# Patient Record
Sex: Female | Born: 1950
Health system: Southern US, Community
[De-identification: ages and names within clinical notes are randomized; demographics above are authoritative.]

## PROBLEM LIST (undated history)

## (undated) DIAGNOSIS — F419 Anxiety disorder, unspecified: Secondary | ICD-10-CM

## (undated) DIAGNOSIS — K219 Gastro-esophageal reflux disease without esophagitis: Secondary | ICD-10-CM

## (undated) DIAGNOSIS — M1990 Unspecified osteoarthritis, unspecified site: Secondary | ICD-10-CM

## (undated) DIAGNOSIS — E785 Hyperlipidemia, unspecified: Secondary | ICD-10-CM

## (undated) DIAGNOSIS — I10 Essential (primary) hypertension: Secondary | ICD-10-CM

## (undated) DIAGNOSIS — F32A Depression, unspecified: Secondary | ICD-10-CM

## (undated) DIAGNOSIS — I341 Nonrheumatic mitral (valve) prolapse: Secondary | ICD-10-CM

## (undated) DIAGNOSIS — Z9889 Other specified postprocedural states: Secondary | ICD-10-CM

## (undated) DIAGNOSIS — J189 Pneumonia, unspecified organism: Secondary | ICD-10-CM

## (undated) DIAGNOSIS — M549 Dorsalgia, unspecified: Secondary | ICD-10-CM

## (undated) DIAGNOSIS — E559 Vitamin D deficiency, unspecified: Secondary | ICD-10-CM

## (undated) DIAGNOSIS — R7303 Prediabetes: Secondary | ICD-10-CM

## (undated) DIAGNOSIS — B009 Herpesviral infection, unspecified: Secondary | ICD-10-CM

## (undated) DIAGNOSIS — E7800 Pure hypercholesterolemia, unspecified: Secondary | ICD-10-CM

## (undated) DIAGNOSIS — K759 Inflammatory liver disease, unspecified: Secondary | ICD-10-CM

## (undated) DIAGNOSIS — K5790 Diverticulosis of intestine, part unspecified, without perforation or abscess without bleeding: Secondary | ICD-10-CM

## (undated) DIAGNOSIS — K922 Gastrointestinal hemorrhage, unspecified: Secondary | ICD-10-CM

## (undated) DIAGNOSIS — F329 Major depressive disorder, single episode, unspecified: Secondary | ICD-10-CM

## (undated) DIAGNOSIS — M170 Bilateral primary osteoarthritis of knee: Secondary | ICD-10-CM

## (undated) DIAGNOSIS — J45909 Unspecified asthma, uncomplicated: Secondary | ICD-10-CM

## (undated) DIAGNOSIS — D649 Anemia, unspecified: Secondary | ICD-10-CM

## (undated) DIAGNOSIS — Z9289 Personal history of other medical treatment: Secondary | ICD-10-CM

## (undated) HISTORY — PX: ARTHROSCOPY KNEE, DIAGNOSTIC: SHX510784

## (undated) HISTORY — DX: Nonrheumatic mitral (valve) prolapse: I34.1

## (undated) HISTORY — DX: Hyperlipidemia, unspecified: E78.5

## (undated) HISTORY — DX: Unspecified osteoarthritis, unspecified site: M19.90

## (undated) HISTORY — DX: Essential (primary) hypertension: I10

## (undated) HISTORY — DX: Depression, unspecified: F32.A

## (undated) HISTORY — DX: Anxiety disorder, unspecified: F41.9

## (undated) HISTORY — PX: COLONOSCOPY: SHX174

## (undated) HISTORY — PX: BREAST EXCISIONAL BIOPSY: SUR124

## (undated) HISTORY — DX: Gastro-esophageal reflux disease without esophagitis: K21.9

## (undated) HISTORY — DX: Bilateral primary osteoarthritis of knee: M17.0

## (undated) HISTORY — DX: Herpesviral infection, unspecified: B00.9

## (undated) HISTORY — DX: Unspecified asthma, uncomplicated: J45.909

## (undated) HISTORY — DX: Other specified postprocedural states: Z98.890

## (undated) HISTORY — DX: Inflammatory liver disease, unspecified: K75.9

## (undated) HISTORY — DX: Pure hypercholesterolemia, unspecified: E78.00

## (undated) HISTORY — PX: KNEE ARTHROSCOPY: SUR90

## (undated) HISTORY — DX: Prediabetes: R73.03

## (undated) HISTORY — DX: Vitamin D deficiency, unspecified: E55.9

## (undated) HISTORY — DX: Pneumonia, unspecified organism: J18.9

## (undated) HISTORY — DX: Gastrointestinal hemorrhage, unspecified: K92.2

## (undated) HISTORY — DX: Diverticulosis of intestine, part unspecified, without perforation or abscess without bleeding: K57.90

## (undated) HISTORY — DX: Personal history of other medical treatment: Z92.89

## (undated) HISTORY — DX: Major depressive disorder, single episode, unspecified: F32.9

## (undated) HISTORY — DX: Anemia, unspecified: D64.9

## (undated) HISTORY — DX: Dorsalgia, unspecified: M54.9

---

## 1996-09-06 DIAGNOSIS — I341 Nonrheumatic mitral (valve) prolapse: Secondary | ICD-10-CM

## 1996-09-06 HISTORY — DX: Nonrheumatic mitral (valve) prolapse: I34.1

## 2004-05-14 ENCOUNTER — Ambulatory Visit: Admit: 2004-05-14 | Disposition: A | Discharge status: Home / Self Care | Payer: Self-pay | Source: Ambulatory Visit

## 2007-05-11 ENCOUNTER — Ambulatory Visit
Admit: 2007-05-11 | Disposition: A | Discharge status: Home / Self Care | Payer: Self-pay | Source: Ambulatory Visit | Admitting: Surgery

## 2007-05-11 LAB — BASIC METABOLIC PANEL
BUN: 23 mg/dL — ABNORMAL HIGH (ref 8–20)
CO2: 29 mEq/L (ref 21–30)
Calcium: 9.3 mg/dL (ref 8.6–10.2)
Chloride: 102 mEq/L (ref 98–107)
Creatinine: 1.1 mg/dL (ref 0.6–1.5)
Glucose: 82 mg/dL (ref 70–100)
Potassium: 4 mEq/L (ref 3.6–5.0)
Sodium: 143 mEq/L (ref 136–146)

## 2007-05-11 LAB — CBC AND DIFFERENTIAL
Basophils Absolute: 0.1 /mm3 (ref 0.0–0.2)
Basophils: 1 % (ref 0–2)
Eosinophils Absolute: 0.2 /mm3 (ref 0.0–0.7)
Eosinophils: 2 % (ref 0–5)
Granulocytes Absolute: 5.1 /mm3 (ref 1.8–8.1)
Hematocrit: 38.3 % (ref 37.0–47.0)
Hgb: 12.3 G/DL (ref 12.0–16.0)
Immature Granulocytes Absolute: 0
Immature Granulocytes: 0 %
Lymphocytes Absolute: 4.5 /mm3 — ABNORMAL HIGH (ref 0.5–4.4)
Lymphocytes: 43 % — ABNORMAL HIGH (ref 15–41)
MCH: 30.1 PG (ref 28.0–32.0)
MCHC: 32.1 G/DL (ref 32.0–36.0)
MCV: 93.9 FL (ref 80.0–100.0)
MPV: 10.6 FL — ABNORMAL HIGH (ref 7.4–10.4)
Monocytes Absolute: 0.7 /mm3 (ref 0.0–1.2)
Monocytes: 6 % (ref 0–11)
Neutrophils %: 49 % — ABNORMAL LOW (ref 52–75)
Platelets: 309 /mm3 (ref 140–400)
RBC: 4.08 /mm3 — ABNORMAL LOW (ref 4.20–5.40)
RDW: 13.6 % (ref 11.5–15.0)
WBC: 10.43 /mm3 (ref 3.50–10.80)

## 2007-05-11 LAB — GFR

## 2007-05-20 ENCOUNTER — Ambulatory Visit: Admission: RE | Admit: 2007-05-20 | Payer: Self-pay | Source: Ambulatory Visit | Admitting: Surgery

## 2007-05-20 HISTORY — PX: BREAST SURGERY: SHX581

## 2008-05-06 HISTORY — PX: BREAST BIOPSY: SHX20

## 2011-02-01 LAB — ECG 12-LEAD
Atrial Rate: 69 {beats}/min
P Axis: 38 degrees
P-R Interval: 160 ms
Q-T Interval: 388 ms
QRS Duration: 86 ms
QTC Calculation (Bezet): 415 ms
R Axis: 45 degrees
T Axis: 42 degrees
Ventricular Rate: 69 {beats}/min

## 2011-02-22 NOTE — Op Note (Signed)
DATE OF BIRTH:                        02-24-1951      ADMISSION DATE:                     05/20/2007            PATIENT LOCATION:                     [id number]            DATE OF PROCEDURE:                   05/20/2007      SURGEON:                            Arnette Norris, MD      ASSISTANT(S):                         Kristen Rupell, PA-C            PREOPERATIVE DIAGNOSIS:      1. Status post stereotactic left breast biopsy.      2. 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            POSTOPERATIVE DIAGNOSIS:      1. Status post stereotactic left breast biopsy.      2. Biopsy diagnosis of atypical ductal hyperplasia-left breast mass.            TITLE OF PROCEDURE:    Needle placement, excisional biopsy left breast.            COMPLICATIONS:    No complications.            ESTIMATED BLOOD LOSS:    Minimal.            ANESTHESIA:  Local sedation.            INDICATION:  The patient is a 60 year old woman kindly referred by her      primary physician, Dr. Clement Sayres for surgical evaluation following a      biopsy of a small mass in the left breast, in the lower outer quadrant in      which a diagnosis of atypical ductal hyperplasia was rendered.  The patient      was seen in consultation, the findings reviewed, the rationale for surgery      discussed.  The patient understood the potential risks, complications and      benefits of operation and agreed to operative intervention on this date.            On the day of surgery, after the patient was taken to x-ray and needle      placement was performed she was seen in the preop holding area.  Final      questions were answered.  The boarding pass and informed consent were      complete.  She was covered prophylactically with 1 gram of Ancef, pneumatic      stockings applied and she was taken to the operating room.            DESCRIPTION OF PROCEDURE:    The patient was placed on the operative table      in the supine position after she was  sedated the left breast was  prepped      and draped in the usual sterile fashion.  Using a needle with a guide and      maintaining dissection within the natural lines of the breast, the skin and      subcutaneous tissue was injected with a combination of local anesthetic.      Once this had been given chance to take effect a small curvilinear incision      was made, carried down to the underlying subcutaneous tissue, exposing the      underlying breast.  The needle was followed to the appropriate length along      its shaft when the parenchyma around the needle was grasped with an Allis      clamp and then excisional biopsy performed.  The specimen was then sent for      radiographic study.  Specimen mammogram confirmed the presence of the      previous area of biopsy including the biopsy clip.  Satisfied, the wound      was checked, fine bleeding controlled with cautery, and when satisfied with      hemostasis additional local was injected in the surrounding parenchyma for      postoperative analgesia prior to skin closure which was done in the usual      way.  The patient tolerated the procedure well and had no intraoperative      complications and was transferred to the recovery area in stable condition      with all sponge, needle and instrument counts reported as correct by the      nursing staff at the conclusion of the case.                  Electronic Signing MD: Arnette Norris, MD  ([id number])            D: 05/20/2007 by Arnette Norris, MD      T: 05/20/2007 by Dot Been ([id number]) ([id number])      cc:  Arnette Norris, MD          Lenard Simmer, MD

## 2012-09-04 ENCOUNTER — Ambulatory Visit (INDEPENDENT_AMBULATORY_CARE_PROVIDER_SITE_OTHER): Payer: No Typology Code available for payment source | Admitting: Family Medicine

## 2012-09-04 ENCOUNTER — Encounter (INDEPENDENT_AMBULATORY_CARE_PROVIDER_SITE_OTHER): Payer: Self-pay

## 2012-09-04 VITALS — BP 120/78 | HR 74 | Temp 98.7°F | Resp 14
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
## 2012-09-04 NOTE — Patient Instructions (Addendum)
Eye Contusion  You have a CONTUSION of your eye. This can cause swelling and bruising of the lids ("black eye") and may also cause bleeding in the white part of the eye. The bruising and lid swelling may increase over the first 12 hours. The lid swelling should start to go down after 1-2 days. The lid bruising may take 1-2 weeks to disappear.  Home Care:  1. Make an ice pack (ice cubes in a plastic bag, wrapped in a towel) and apply for 20 minutes every 1-2 hours the first day. Continue this 3-4 times a day until the swelling starts to go down.  2. You may use acetaminophen (Tylenol) or ibuprofen (Motrin, Advil) to control pain, unless another pain medicine was prescribed. [NOTE:If you have chronic liver or kidney disease or ever had a stomach ulcer or GI bleeding, talk with your doctor before using these medicines.]  Follow Up  with your doctor or this facility if you are not improving within the next THREE days.  [NOTE: If X-rays were taken, they will be reviewed by a radiologist. You will be notified of any new findings that may affect your care.]  Get Prompt Medical Attention  if any of the following occur:   Increasing eye pain   Unable to open eyelid after 2 days, due to swelling   Any sudden changes in your vision   Light flashes   Floaters (small dots or strings that seem to be moving across your field of vision)   Eye pain, redness, or discharge from your eyelid   Blurriness that lasts more than 24 hours      IF WORSE ,   GO TO E.R.       Dark spots in your field of vision   Halos around lights   Dimness of vision   Partial or complete loss of vision   [address], [address], [place], [place] [address]. All rights reserved. This information is not intended as a substitute for professional medical care. Always follow your healthcare professional's instructions.

## 2012-10-03 ENCOUNTER — Other Ambulatory Visit: Payer: Self-pay

## 2012-10-03 MED ORDER — GUAIFENESIN-CODEINE 100-10 MG/5ML PO SYRP
ORAL_SOLUTION | ORAL | 0 refills | Status: DC
Start: 2012-10-03 — End: 2012-10-18
  Filled 2012-10-03: qty 120, 3d supply, fill #0

## 2012-10-03 MED ORDER — ALBUTEROL SULFATE HFA 108 (90 BASE) MCG/ACT IN AERS
INHALATION_SPRAY | RESPIRATORY_TRACT | 0 refills | Status: DC
Start: 2012-10-03 — End: 2012-10-18
  Filled 2012-10-03: qty 8.5, 17d supply, fill #0

## 2012-10-12 ENCOUNTER — Other Ambulatory Visit: Payer: Self-pay

## 2012-10-12 MED ORDER — ALBUTEROL SULFATE HFA 108 (90 BASE) MCG/ACT IN AERS
2.0000 | INHALATION_SPRAY | RESPIRATORY_TRACT | 1 refills | Status: DC | PRN
Start: 2012-10-12 — End: 2015-02-06
  Filled 2012-10-12 (×2): qty 8.5, 17d supply, fill #0

## 2012-10-18 ENCOUNTER — Emergency Department
Admission: EM | Admit: 2012-10-18 | Discharge: 2012-10-18 | Disposition: A | Discharge status: Home / Self Care | Payer: Commercial Managed Care - PPO | Attending: Emergency Medicine | Admitting: Emergency Medicine

## 2012-10-18 ENCOUNTER — Emergency Department: Payer: Commercial Managed Care - PPO

## 2012-10-18 DIAGNOSIS — E785 Hyperlipidemia, unspecified: Secondary | ICD-10-CM | POA: Insufficient documentation

## 2012-10-18 DIAGNOSIS — F3289 Other specified depressive episodes: Secondary | ICD-10-CM | POA: Insufficient documentation

## 2012-10-18 DIAGNOSIS — R059 Cough, unspecified: Secondary | ICD-10-CM | POA: Insufficient documentation

## 2012-10-18 DIAGNOSIS — I10 Essential (primary) hypertension: Secondary | ICD-10-CM | POA: Insufficient documentation

## 2012-10-18 MED ORDER — METHYLPREDNISOLONE 4 MG PO KIT
PACK | ORAL | Status: DC
Start: 2012-10-18 — End: 2014-10-18

## 2012-10-18 NOTE — ED Notes (Signed)
Cough x 2 weeks, prod. X 2 weeks

## 2012-10-18 NOTE — Discharge Instructions (Signed)
Please take steroid medication as prescribed.  Use an H2 blocker (pepcid) for 1-2 weeks and monitor your symptoms to see if they improve.   If Pepcid fails, you may try a proton-pump inhibitor, like Prilosec, instead.

## 2012-10-18 NOTE — ED Provider Notes (Signed)
Physician/Midlevel provider first contact with patient: 10/18/12 1713         EMERGENCY DEPARTMENT HISTORY AND PHYSICAL EXAM    Date: 10/18/2012  Patient Name: Katherine Freeman  Attending Physician: Katherine Cristal, MD        History of Presenting Illness     Chief Complaint:   Chief Complaint   Patient presents with   . Cough     productive white/yllw phlegm x 2 weeks       Katherine Freeman is a 62 y.o. female who  has a past medical history of Depression; Hypertensive disorder; and Hyperlipidemia. c/o persistent productive cough (white sputum) x 2 weeks. Associated with intermittent low grade fever, and mild shortness of breath and chest congestion and tightness. Cough is worse at night. Pt seen by PCP 1.5 weeks ago - prescribed Zpak, cough syrup with codeine, and inhaler with minimal relief.    History obtained from: the patient.    PCP: Katherine Simmer, MD     Past Medical History     Past Medical History   Diagnosis Date   . Depression    . Hypertensive disorder    . Hyperlipidemia        Past Surgical History     History reviewed. No pertinent past surgical history.    Family History     Family History   Problem Relation Age of Onset   . Heart disease Mother    . Stroke Mother    . Cancer Sister    . Cancer Brother        Social History     History     Social History   . Marital Status: Divorced     Spouse Name: N/A     Number of Children: N/A   . Years of Education: N/A     Occupational History   . Not on file.     Social History Main Topics   . Smoking status: Former Games developer   . Smokeless tobacco: Not on file   . Alcohol Use: No   . Drug Use: No   . Sexually Active:      Other Topics Concern   . Not on file     Social History Narrative   . No narrative on file        Allergies     No Known Allergies    Medications     No current facility-administered medications for this encounter.  Current outpatient prescriptions:Mometasone Furo-Formoterol Fum (DULERA) 100-5 MCG/ACT Aerosol, Inhale into the lungs., Disp: ,  Rfl: ;  albuterol (PROAIR HFA) 108 (90 BASE) MCG/ACT inhaler, Inhale 2 puffs into the lungs every 4 (four) hours as needed for bronchiospasm and cough., Disp: 8.5 g, Rfl: 1;  benazepril-hydrochlorthiazide (LOTENSIN HCT) 10-12.5 MG per tablet, Take 1 tablet by mouth daily., Disp: , Rfl:   methylPREDNISolone (MEDROL DOSEPACK) 4 MG tablet, Use as directed, Disp: 21 tablet, Rfl: 0;  sertraline (ZOLOFT) 100 MG tablet, Take 100 mg by mouth daily., Disp: , Rfl: ;  simvastatin (ZOCOR) 20 MG tablet, Take 20 mg by mouth nightly., Disp: , Rfl: ;  [DISCONTINUED] albuterol (PROAIR HFA) 108 (90 BASE) MCG/ACT inhaler, Inhale 2 puffs every 4 hours as needed for wheezing, Disp: 8.5 g, Rfl: 0  [DISCONTINUED] guaiFENesin-codeine (CHERATUSSIN AC) 100-10 MG/5ML syrup, Take 10 mls by mouth every 6 hours as needed for cough, Disp: 120 mL, Rfl: 0     Review of Systems     Pertinent positives and  negatives noted in the HPI  All other systems reviewed and are negative: Yes    Physical Exam     BP 133/66  Pulse 75  Temp 98.5 F (36.9 C)  Resp 16  Ht 1.6 m  Wt 93.441 kg  BMI 36.50 kg/m2  SpO2 100%  Constitutional: Vital signs reviewed. Well appearing. Nontoxic  Head: Normocephalic, atraumatic    Neck: Normal range of motion. Trachea midline.  Respiratory/Chest: Lungs clear, no rales, no wheezing  Cardiovascular:   Abdomen:   UpperExtremity:No edema or cyanosis  LowerExtremity:No edema or cyanosis, non tender, no swelling  Neurological: No focal motor deficits by observation. Speech normal. Memory normal.  Skin: Warm and dry. No rash.  Psychiatric: Normal affect and normal concentration for age     Diagnosis and Treatment Plan     Differential Diagnosis (not completely inclusive): bronchospasm, pneumonia, cough, GERD    Presumptive Diagnosis:   1. Coughing         Treatment Plan: home, see patient instructions for treatment and plan    Diagnostic Study Results     Labs -     Results     ** No Results found for the last 24 hours. **           Radiologic Studies -   Radiology Results (24 Hour)     Procedure Component Value Units Date/Time    Chest 2 Views [[id number]] Collected:10/18/12 1643    Order Status:Completed  Updated:10/18/12 1648    Narrative:    CLINICAL HISTORY: Cough.    FINDINGS: PA and lateral views of the chest.    No infiltrate. No vascular congestion or pleural effusion. Cardiac and  mediastinal contours are within normal.      Impression:     No acute disease.    Katherine Lose, MD   10/18/2012 4:44 PM      .    Clinical Course in the Emergency Department     PT with cough for 2 weeks, well appearing    Order and review chest xray    Tapered steroid prescription for inflammation, and advise on using H2 blocker and PPI and monitor symptoms for improvement  Pt has f/u appt with PMD tomorrow    Medical Decision Making   I am the first provider for this patient.  I reviewed the vital signs, nursing notes, past medical history, past surgical history, family history and social history.  I have reviewed the patient's previous charts.  Vital Signs - BP 133/66  Pulse 75  Temp 98.5 F (36.9 C)  Resp 16  Ht 1.6 m  Wt 93.441 kg  BMI 36.50 kg/m2  SpO2 100%   Pulse Oximetry Analysis - Normal  Radiologic study results reviewed by EDP: Yes  Radiologic Studies Interpreted (viewed) by EDP: Yes  Prior charts/medical history reviewed: Yes    _______________________________    Attestations:    I am the first provider for this patient and I personally performed the services documented.  Treatment Team: Scribe: Katherine Freeman is scribing for me on Katherine Freeman. I reviewed and confirm the accuracy of the information in this medical record.   Katherine Cristal, MD    I, Treatment Team: Scribe: Katherine Freeman am serving as a scribe to document services personally performed by Katherine Cristal, MD based on my observations and the providers statements to me.   Treatment Team: Scribe: Katherine Freeman     Time of Initial eval by Dr. Geraldo Freeman  at  5:13 PM   _______________________________                        Katherine Gambler, MD  10/18/12 [phone]

## 2012-10-18 NOTE — ED Notes (Signed)
Cough x 2 weeks, hard to lay down at night because of phlegm

## 2012-10-26 DIAGNOSIS — Z9289 Personal history of other medical treatment: Secondary | ICD-10-CM

## 2012-10-26 HISTORY — DX: Personal history of other medical treatment: Z92.89

## 2013-01-21 ENCOUNTER — Ambulatory Visit: Payer: Commercial Managed Care - PPO

## 2013-01-21 NOTE — Pre-Procedure Instructions (Signed)
Received call from Dr Winferd Humphrey office, no ekg on file.

## 2013-01-22 ENCOUNTER — Encounter
Admission: RE | Disposition: A | Discharge status: Home / Self Care | Payer: Self-pay | Source: Ambulatory Visit | Attending: Internal Medicine

## 2013-01-22 ENCOUNTER — Encounter: Payer: Self-pay | Admitting: Certified Registered"

## 2013-01-22 ENCOUNTER — Ambulatory Visit: Payer: No Typology Code available for payment source | Admitting: Certified Registered"

## 2013-01-22 ENCOUNTER — Ambulatory Visit: Payer: Self-pay

## 2013-01-22 ENCOUNTER — Ambulatory Visit: Payer: Commercial Managed Care - PPO | Admitting: Internal Medicine

## 2013-01-22 ENCOUNTER — Ambulatory Visit
Admission: RE | Admit: 2013-01-22 | Discharge: 2013-01-22 | Disposition: A | Discharge status: Home / Self Care | Payer: No Typology Code available for payment source | Source: Ambulatory Visit | Attending: Internal Medicine | Admitting: Internal Medicine

## 2013-01-22 ENCOUNTER — Encounter: Payer: Self-pay | Admitting: Gastroenterology

## 2013-01-22 DIAGNOSIS — K5730 Diverticulosis of large intestine without perforation or abscess without bleeding: Secondary | ICD-10-CM | POA: Insufficient documentation

## 2013-01-22 DIAGNOSIS — K319 Disease of stomach and duodenum, unspecified: Secondary | ICD-10-CM | POA: Insufficient documentation

## 2013-01-22 DIAGNOSIS — I059 Rheumatic mitral valve disease, unspecified: Secondary | ICD-10-CM | POA: Insufficient documentation

## 2013-01-22 DIAGNOSIS — K644 Residual hemorrhoidal skin tags: Secondary | ICD-10-CM | POA: Insufficient documentation

## 2013-01-22 DIAGNOSIS — E785 Hyperlipidemia, unspecified: Secondary | ICD-10-CM | POA: Insufficient documentation

## 2013-01-22 DIAGNOSIS — Z1211 Encounter for screening for malignant neoplasm of colon: Secondary | ICD-10-CM | POA: Insufficient documentation

## 2013-01-22 DIAGNOSIS — K2980 Duodenitis without bleeding: Secondary | ICD-10-CM | POA: Insufficient documentation

## 2013-01-22 DIAGNOSIS — I10 Essential (primary) hypertension: Secondary | ICD-10-CM | POA: Insufficient documentation

## 2013-01-22 HISTORY — DX: Gastro-esophageal reflux disease without esophagitis: K21.9

## 2013-01-22 HISTORY — PX: EGD, COLONOSCOPY: SHX3799

## 2013-01-22 SURGERY — EGD, COLONOSCOPY
Anesthesia: Anesthesia General

## 2013-01-22 MED ORDER — PROPOFOL 10 MG/ML IV EMUL
INTRAVENOUS | Status: AC
Start: 2013-01-22 — End: ?
  Filled 2013-01-22: qty 20

## 2013-01-22 MED ORDER — PROPOFOL INFUSION 10 MG/ML
INTRAVENOUS | Status: DC | PRN
Start: 2013-01-22 — End: 2013-01-22
  Administered 2013-01-22: 140 ug/kg/min via INTRAVENOUS

## 2013-01-22 MED ORDER — FENTANYL CITRATE 0.05 MG/ML IJ SOLN
INTRAMUSCULAR | Status: AC
Start: 2013-01-22 — End: ?
  Filled 2013-01-22: qty 2

## 2013-01-22 MED ORDER — PROPOFOL 10 MG/ML IV EMUL
INTRAVENOUS | Status: AC
Start: 2013-01-22 — End: ?
  Filled 2013-01-22: qty 50

## 2013-01-22 MED ORDER — PROPOFOL INFUSION 10 MG/ML
INTRAVENOUS | Status: DC | PRN
Start: 2013-01-22 — End: 2013-01-22
  Administered 2013-01-22 (×2): 40 mg via INTRAVENOUS
  Administered 2013-01-22: 80 mg via INTRAVENOUS

## 2013-01-22 MED ORDER — LACTATED RINGERS IV SOLN
INTRAVENOUS | Status: DC
Start: 2013-01-22 — End: 2013-01-22

## 2013-01-22 MED ORDER — FENTANYL CITRATE 0.05 MG/ML IJ SOLN
INTRAMUSCULAR | Status: DC | PRN
Start: 2013-01-22 — End: 2013-01-22
  Administered 2013-01-22: 50 ug via INTRAVENOUS

## 2013-01-22 SURGICAL SUPPLY — 23 items
BLOCK BITE MAXI 60FR LF STRD STRAP SDPRT (Procedure Accessories) ×1
BLOCK BITE OD60 FR STURDY STRAP SIDEPORT (Procedure Accessories) ×1
BLOCK BITE OD60 FR STURDY STRAP SIDEPORT DENTAL RETENTION RIM MAXI (Procedure Accessories) IMPLANT
CONTAINER HISTOLOGY 60 ML 30 ML GRADUATE LEAK RESISTANT O RING PREFILL (Procedure Accessories) IMPLANT
FORCEP BIOPSY 240CM RADIAL JA (Instrument) ×1 IMPLANT
GLOVE EXAM NITRIL LARGE PURPLE (Glove) ×1 IMPLANT
GLOVE EXAM NITRILE MEDIUM (Glove) ×1 IMPLANT
GLOVE NITRILE EXAM PURP SM (Glove) ×1 IMPLANT
GOWN ISL PP PE REG LG LF FULL BCK NK TIE (Gown) ×4
GOWN ISOLATION REGULAR LARGE FULL BACK NECK TIE ELASTIC CUFF (Gown) ×1 IMPLANT
SOL FORMALIN 10% PREFILL 30ML (Procedure Accessories) ×2
SPONGE GAUZE L4 IN X W4 IN 4 PLY HIGH (Sponge) ×4
SPONGE GAUZE L4 IN X W4 IN 4 PLY NONWOVEN LINT FREE CURITY RAYON (Sponge) ×1 IMPLANT
SPONGE GZE RYN PLSTR CRTY 4X4IN LF NS 4 (Sponge) ×4
SYRINGE 50 ML GRADUATE NONPYROGENIC DEHP (Syringes, Needles)
SYRINGE 50 ML GRADUATE NONPYROGENIC DEHP FREE PVC FREE BD MEDICAL (Syringes, Needles) ×1 IMPLANT
SYRINGE MED 50ML LF STRL GRAD N-PYRG (Syringes, Needles)
TUBING CONNECTING STERILE 10FT (Tubing) ×1
TUBING SUCTION ID3/16 IN L10 FT (Tubing) ×1
TUBING SUCTION ID3/16 IN L10 FT NONCONDUCTIVE STRAIGHT MALE FEMALE (Tubing) IMPLANT
WATER STERILE PLASTIC POUR BOTTLE 1000 (Irrigation Solutions) ×1
WATER STERILE PLASTIC POUR BOTTLE 1000 ML (Irrigation Solutions) ×2 IMPLANT
WATER STRL 1000ML PLS PR BTL LF (Irrigation Solutions) ×1

## 2013-01-22 NOTE — Discharge Instructions (Signed)
EGD Discharge Instructions  General Instructions:  1. Following sedation, your judgement, perception, and coordination are considered impaired. Even though you may feel awake and alert, you are considered legally intoxicated. Therefore, until the next morning;   Do not Drive   Do not operate appliances or equipment that requires reaction time (e.g. Stove, electrical tools, machinery)   Do not sign legal documents or be involved in important decisions.   Do not smoke if alone   Do not drink alcoholic beverages   Go directly home and rest for several hours before resuming your routine activities.   It is highly recommended to have a responsible adult stay with you for the  next 24 hours    2. Tenderness, swelling or pain may occur at the IV site where you received sedation. If you experience this, apply warm soaks to the area. Notify your physician if this persists.    Instructions Specific To Procedures - Report To Physician Any Of The Following:    Upper Endoscopy, ERCP, Dilations   1. Pain in Chest   2. Nausea/vomitting   3. Fevers/Chills within 24 hours after procedure. Temp>101deg F   4. Severe and persistent abdominal pain and bloating     In Addition:   Mild throat soreness may follow this procedure. Warm salt water gargling or lozenges of your choice will most likely relieve your discomfort or cold drinks and  popsicles.       Additional Discharge Instructions  Your diet after the procedure: Start with something light (Toast, Jello, Soup, Etc.), Then Resume to Regular Diet as Tolerated. Nothing Spicy, Greasy or [employer] Today. NO RED FLUIDS, FOODS OR SAUCES FOR 24 HOURS.    Special Instructions: ***  Prescriptions given: {YES (DEF)/NO:21773}  Patient education literature given; {YES (DEF)/NO:21773}      If you have questions or problems contact your MD immediately. If you need immediate attention, call your MD, 911 and/or go to nearest emergency room.              Colonoscopy Discharge  Instructions  General Instructions:  1. Following sedation, your judgement, perception, and coordination are considered impaired. Even though you may feel awake and alert, you are considered legally intoxicated. Therefore, until the next morning;   Do not Drive   Do not operate appliances or equipment that requires reaction time (e.g.Stove, electrical tools, machinery)   Do not sign legal documents or be involved in important decisions.   Do not smoke if alone   Do not drink alcoholic beverages   Go directly home and rest for several hours before resuming your routine activities.   It is highly recommended to have a responsible adult stay with you for the next 24 hours    2. Tenderness, swelling or pain may occur at the IV site where you received sedation. If you experience this, apply warm soaks to the area. Notify your physician if this persists.    Instructions Specific To Procedures - Report To Physician Any Of The Following:    Colon/Sigmoidoscopy/Proctoscopy   1. Severe and persistent abdominal pain/bloating which does not subside within 2-3 hours   2. Large amount of rectal bleeding (some mucosal blood streaking may occur, especially if biopsy or polypectomy was done or if hemorrhoids are present.   3. Nausea/vomitting   4. Fevers/Chills within 24 hours after procedure. Temp>101deg F     In Addition:   If polyp has been removed, DO NOT take aspirin or aspirin containing products (e.g.  Anacin, Alka Seltzer, Bufferin, Etc.) or non-steroidal anti-inflammatory drugs (e.g. Advil, Motrin, etc.) for *** days unless otherwise advised by doctor. Tylenol  or extra Strength Tylenol is permitted.    Additional Discharge Instructions  Your diet after the procedure:  Start with something light (Toast, Jello, Soup, Etc.), Then Resume to Regular Diet as Tolerated. Nothing Spicy, Greasy or [employer] Today.  Special Instructions: ***  Prescriptions given: {YES (DEF)/NO:21773}  Patient education literature given; {YES  (DEF)/NO:21773}      If you have questions or problems contact your MD immediately. If you need immediate attention, call your MD, 911 and/or go to nearest emergency room.        Understanding Colon and Rectal Polyps  The colon (also called the large intestine) is a muscular tube that forms the last part of the digestive tract. It absorbs water and stores food waste. The colon is about 4 to 6 feet long. The rectum is the last 6 inches of the colon. The colon and rectum have a smooth lining composed of millions of cells. Changes in these cells can lead to growths in the colon that can become cancerous and should be removed.     The colon has a smooth lining composed of millions of cells.   When the Colon Lining Changes  Changes that occur in the cells that line the colon or rectum can lead to growths called polyps. Over a period of years, polyps can turn cancerous. Removing polyps early may prevent cancer from ever forming.     Polyps  Polyps are fleshy clumps of tissue that form on the lining of the colon or rectum. Small polyps are usually benign (not cancerous). However, over time, cells in a polyp can change and become cancerous. The larger a polyp grows, the more likely this is to happen. Also, certain types of polyps known as adenomatous polyps are considered premalignant. This means that they will almost always become cancerous if they're not removed.   Cancer  Almost all colorectal cancers start when polyp cells begin growing abnormally. As a cancerous tumor grows, it may involve more and more of the colon or rectum. In time, cancer can also grow beyond the colon or rectum and spread to nearby organs or to glands called lymph nodes. The cells can also travel to other parts of the body. This is known as metastasis. The earlier a cancerous tumor is removed, the better the chance of preventing its spread.   [address], [place], [place] [address]. All rights reserved. This  information is not intended as a substitute for professional medical care. Always follow your healthcare professional's instructions.        If You Have Diverticulosis  Diet changes are often enough to control symptoms. The main changes are adding fiber (roughage) and drinking more water. Fiber absorbs water as it travels through your colon. This helps your stool stay soft and move smoothly. Water helps this process. If needed, you may be told to take over-the-counter stool softeners. To help relieve pain, antispasmodic medications may be prescribed.  If You Have Diverticulitis  Treatment depends on how bad your symptoms are.   For mild symptoms: You may be put on a liquid diet for a short time. You may also be prescribed antibiotics. If these two steps relieve your symptoms, you may then be prescribed a high-fiber diet. If you still have symptoms, your doctor will discuss further treatment options with you.   For severe  symptoms: You may need to be admitted to the hospital. There, you can be given IV antibiotics and fluids. Once symptoms are under control, the above treatments may be tried. If these don't control your condition, your doctor may discuss the option of having surgery with you.  Keys to Colon Health  Help keep your colon healthy with a diet that includes plenty of high-fiber fruits, vegetables, and whole grains. Drink plenty of liquids like water and juice. Your doctor may also recommend avoiding seeds and nuts.   [address], [address] [place], [place] [address]. All rights reserved. This information is not intended as a substitute for professional medical care. Always follow your healthcare professional's instructions.

## 2013-01-22 NOTE — Anesthesia Postprocedure Evaluation (Signed)
The patient is awake or easily arousable.      The patients respirations, and cardiovascular status have been evaluated and deemed stable post op.     Post op nausea, vomiting and pain have been treated and controlled as effectively as possible without compromising the patients respiratory and cardiovascular status.    Please refer to Post Op PACU Documentation for confirmation of attainment of normothermia and adequate hydration status.    There were no obvious anesthetic related complications.

## 2013-01-22 NOTE — H&P (Signed)
Patient Type: Katherine Freeman     ATTENDING PHYSICIAN: Lesle Reek, MD     CHIEF COMPLAINT:  The patient needs screening colonoscopy.  The second issue is persistent  gastroesophageal reflux disease gastritis.  She is concerned about her  upper gastrointestinal tract and wants to be checked.     HISTORY OF PRESENT ILLNESS:  The patient has had persistent cough for several weeks off and on since May  of 2014 and has had to stop working because she was constantly coughing.   She has also acid reflux and has had this for Katherine Freeman while and wants to have  investigation.     CURRENT MEDICATIONS:  1.  Nexium.  2.  Zoloft.  3.  Simvastatin.  4.  HCTZ.      ALLERGIES:  No known drug allergies.     PAST MEDICAL HISTORY:  Hyperlipidemia, GERD, hypertension, breast biopsy, knee surgery.     FAMILY HISTORY:  Half brother had stomach cancer in his 34s, had small bowel cancer.  Sister  had lung cancer.     PERSONAL HISTORY:  The patient used to smoke, stopped 20 years ago.  No alcohol recently.  She  works at [employer].     PHYSICAL EXAMINATION:  GENERAL:  No distress.  HEART AND LUNGS:  Clear.  ABDOMEN:  Soft, nontender.     The patient never had colonoscopy.     ASSESSMENT:  1.  Screening colonoscopy necessary.  2.  Persistent gastroesophageal reflux disease and gastritis.     RECOMMENDATIONS:  Proceed with endoscopy and colonoscopy.           D:  01/22/2013 11:58 AM by Dr. Sandi Mealy L. Carolynn Sayers, MD [phone])  T:  01/22/2013 13:31 PM by Raynelle Fanning      (Conf: [id number]) (Doc ID: [id number])

## 2013-01-22 NOTE — Transfer of Care (Signed)
Anesthesia Transfer of Care Note    Patient: Katherine Freeman    Procedures performed: Procedure(s) with comments:  EGD, COLONOSCOPY - EGD, COLONOSCOPY W/IVA    Anesthesia type: General TIVA    Patient location:Phase II PACU    Last vitals:   Filed Vitals:    01/22/13 1159   BP: 128/59   Pulse: 58   Resp: 10   SpO2: 100%       Post pain: Patient not complaining of pain, continue current therapy      Mental Status:alert     Respiratory Function: tolerating room air    Cardiovascular: stable    Nausea/Vomiting: patient not complaining of nausea or vomiting    Hydration Status: adequate    Post assessment: no apparent anesthetic complications, no reportable events and no evidence of recall

## 2013-01-22 NOTE — Op Note (Signed)
Procedure Date: 01/22/2013     Patient Type: A     SURGEON: Luberta Mutter MD  ASSISTANT:       PREOPERATIVE DIAGNOSES:  1.  Screening colonoscopy.  2.  Persistent gastroesophageal reflux disease, gastritis.     POSTOPERATIVE DIAGNOSES:  1.  Marked diverticulosis throughout the colon.  2.  No evidence of colon polyp at this time.  3.  Moderate-sized external hemorrhoid.  4.  Polypoid lesion in the duodenum, but the mucosa covering this it looks  perfectly normal.  Biopsies taken to check for histology.  5.  The upper endoscopy otherwise was unremarkable.  6.  Biopsy taken for Helicobacter pylori via histology.     TITLE OF PROCEDURE:   1.  Upper endoscopy and biopsy.  2.  Fiberoptic colonoscopy.     MEDICATIONS:  The patient received IV sedation given by anesthesiologist.     DESCRIPTION OF PROCEDURE:  The patient was placed in the left lateral recumbent position and the  endoscope was passed through the mouth down into the esophagus.  Careful  examination of the entire esophagus revealed entirely unremarkable  findings.  There is no evidence of Barrett esophagus.  The stomach was  explored including a retroflexed view of the fundus and cardia, revealing  completely normal findings.  The duodenum was entered and at the junction  between the first and second portions was noted evidence of a polypoid  lesion, but the mucosa covering this looked perfectly normal.  Biopsies  were taken from this polypoid lesion and sent for histology, but as  indicated this really does not look like a true polyp.  The second and  third portions of the duodenum were unremarkable.  The endoscope was then  pulled out slowly.  Biopsies were taken from the antrum to check for H.  pylori.  No other abnormalities found.     The patient then underwent colonoscopy.  Initial anal inspection revealed  moderate-sized external hemorrhoid.  Digital palpation was unremarkable.   The colonoscope was inserted through the rectum, visualizing  satisfactory  prep.  The colonoscope advanced into the sigmoid colon with maneuvering.   There is mild tortuosity of the sigmoid colon associated with marked  diverticulosis of the sigmoid colon.  This was maneuvered and colonoscope  advanced to the descending colon, transverse colon, and with maneuvering  and pressure over the abdomen, into the ascending colon, cecum, ileocecal  valve, base of cecum, and appendiceal orifice.  Prolonged examination of  the entire colon was done upon withdrawal of the instrument, spending about  12 minutes of withdrawal time, revealing no polyps.  Retroflexed view of  the rectum was unremarkable.           D:  01/22/2013 11:55 AM by Dr. Sandi Mealy L. Arron Tetrault, MD [phone])  T:  01/22/2013 21:00 PM by [id number]      Everlean Cherry: [id number]) (Doc ID: [id number])

## 2013-01-22 NOTE — Anesthesia Preprocedure Evaluation (Signed)
Anesthesia Evaluation    AIRWAY    Mallampati: II    TM distance: >3 FB  Neck ROM: full  Mouth Opening:full   CARDIOVASCULAR    cardiovascular exam normal, regular and normal       DENTAL        (+) implants     PULMONARY    pulmonary exam normal and clear to auscultation     OTHER FINDINGS                      Anesthesia Plan    ASA 2     general                     intravenous induction   Detailed anesthesia plan: general IV  Monitors/Adjuncts: other    Post Op: other  Post op pain management: per surgeon    informed consent obtained    Plan discussed with CRNA.

## 2013-01-26 ENCOUNTER — Encounter: Payer: Self-pay | Admitting: Internal Medicine

## 2013-05-06 DIAGNOSIS — B009 Herpesviral infection, unspecified: Secondary | ICD-10-CM

## 2013-05-06 HISTORY — DX: Herpesviral infection, unspecified: B00.9

## 2013-08-06 ENCOUNTER — Ambulatory Visit (INDEPENDENT_AMBULATORY_CARE_PROVIDER_SITE_OTHER): Payer: No Typology Code available for payment source | Admitting: Family Medicine

## 2013-08-06 ENCOUNTER — Encounter (INDEPENDENT_AMBULATORY_CARE_PROVIDER_SITE_OTHER): Payer: Self-pay | Admitting: Family Medicine

## 2013-08-06 VITALS — BP 123/76 | HR 77 | Temp 97.6°F | Resp 18 | Ht 63.0 in | Wt 183.2 lb

## 2013-08-06 DIAGNOSIS — M549 Dorsalgia, unspecified: Secondary | ICD-10-CM

## 2013-08-06 DIAGNOSIS — S335XXA Sprain of ligaments of lumbar spine, initial encounter: Secondary | ICD-10-CM

## 2013-08-06 DIAGNOSIS — S39012A Strain of muscle, fascia and tendon of lower back, initial encounter: Secondary | ICD-10-CM

## 2013-08-06 NOTE — Patient Instructions (Signed)
Back Sprain Or Strain    You have injured the muscles (strain) or ligaments (sprain) around the spine. This may occur after a sudden forceful twisting or bending force (such as in a car accident), after a simple awkward movement, or after lifting something heavy with poor body positioning. In either case, muscle spasm is often present and adds to the pain.  A back sprain or muscle strain usually gets better in 1-2 weeks. Unless you had a forceful physical injury (for example, a car accident or fall), X-rays are usually not ordered for the initial evaluation of a back sprain or strain. If pain continues and does not respond to medical treatment, X-rays and other tests may be performed at a later time.  Home care  The following guidelines will help you care for your injury at home:  1. You may need to stay in bed the first few days. But, as soon as possible, begin sitting or walking to avoid problems with prolonged bed rest (muscle weakness, worsening back stiffness and pain, blood clots in the legs).  2. When in bed, try to find a position of comfort. A firm mattress is best. Try lying flat on your back with pillows under your knees. You can also try lying on your side with your knees bent up towards your chest and a pillow between your knees.  3. Avoid prolonged sitting. This puts more stress on the lower back than standing or walking.  4. During the first two days after injury, apply anice packto the painful area for 20 minutes every 2-4 hours. This will reduce swelling and pain.Heat(hot shower, hot bath or heating pad) works well for muscle spasm. You can start with ice, then switch to heat after two days. Some patients feel best alternating ice and heat treatments. Use the one method that feels the best to you.  5. You may use acetaminophen or ibuprofen to control pain, unless another pain medicine was prescribed. If you have chronic liver or kidney disease or ever had a stomach ulcer or GI bleeding, talk  with your doctor before using these medicines.  6. Be aware of safe lifting methods and do not lift anything over 15 pounds until all the pain is gone.  Follow-up care  Follow up with your doctor or this facility as advised. Physical therapy or further tests may be needed if symptoms worsen.  If you had X-rays today, they didn't show any broken bones, breaks, or fractures. Sometimes fractures don't show up on the first X-ray. Bruises and sprains can sometimes hurt as much as a fracture. These injuries can take time to heal completely. If your symptoms don't improve or they get worse, talk with your doctor. You may need a repeat X-ray.  When to seek medical care  Get prompt medical attention if any of the following occur:   Pain becomes worse or spreads to your arms or legs   Weakness or numbness in one or both arms or legs   Loss of bowel or bladder control   Numbness in the groin or genital area   [address], [place], [place] [address]. All rights reserved. This information is not intended as a substitute for professional medical care. Always follow your healthcare professional's instructions.    REST ACTIVELY    COLD COMPRESSES - 20 MINUTE INTERVALS - FOR INJURY    WHEN BETTER, WARM COMPRESSES FOR HEALING     PAIN - FREE STRETCHING    SWIMMING  PHYSICAL THERAPY  F/U 2 WEEKS

## 2013-08-06 NOTE — Progress Notes (Signed)
Subjective:       Patient ID: Katherine Freeman is a 63 y.o. female.    HPI    Pt lbp since restraining pt at work 1 week ago    Review of Systems   Musculoskeletal: Positive for back pain.        LBP     No leg radiation    No weakness           Objective:     Physical Exam   Nursing note and vitals reviewed.  Constitutional: She is oriented to person, place, and time. She appears well-developed and well-nourished. No distress.   HENT:   Head: Normocephalic and atraumatic.   Eyes: EOM are normal. Pupils are equal, round, and reactive to light.   Neck: Normal range of motion. Neck supple.   Musculoskeletal: She exhibits tenderness. She exhibits no edema.          No spinal tenderness  BILAT. Lumbar tender to palpation; No swelling  SLR Negative to palpation  Gait antalgic  Decreased flexion to 30 degrees     Neurological: She is alert and oriented to person, place, and time. No cranial nerve deficit.   Skin: Skin is warm and dry. No rash noted. She is not diaphoretic. No erythema. No pallor.   Psychiatric: She has a normal mood and affect. Her behavior is normal. Judgment and thought content normal.           Assessment:       LUMBAR STRAIN      Plan:       P.T.    REST ACTIVELY    COLD COMPRESSES - 20 MINUTE INTERVALS - FOR INJURY    WHEN BETTER, WARM COMPRESSES FOR HEALING     PAIN - FREE STRETCHING    SWIMMING    F/U 2 WEEKS    Patient to follow up with here. Patient/family verbalize understanding. Reviewed discharge instructions that are included here with patient, and printed in AVS. Questions answered.

## 2013-08-20 ENCOUNTER — Encounter (INDEPENDENT_AMBULATORY_CARE_PROVIDER_SITE_OTHER): Payer: Self-pay | Admitting: Family Medicine

## 2013-08-20 ENCOUNTER — Ambulatory Visit (INDEPENDENT_AMBULATORY_CARE_PROVIDER_SITE_OTHER): Payer: No Typology Code available for payment source | Admitting: Family Medicine

## 2013-08-20 VITALS — BP 111/69 | HR 76 | Temp 98.6°F | Resp 18 | Ht 63.0 in | Wt 183.0 lb

## 2013-08-20 DIAGNOSIS — IMO0002 Reserved for concepts with insufficient information to code with codable children: Secondary | ICD-10-CM

## 2013-08-20 DIAGNOSIS — S39012S Strain of muscle, fascia and tendon of lower back, sequela: Secondary | ICD-10-CM

## 2013-08-20 NOTE — Progress Notes (Signed)
Subjective:       Patient ID: Katherine Freeman is a 63 y.o. female.    HPI    Better.   pt has not yet started P.T.      Review of Systems   Musculoskeletal: Positive for back pain.           Objective:     Physical Exam   Nursing note and vitals reviewed.  Constitutional: She is oriented to person, place, and time. She appears well-developed and well-nourished. No distress.   HENT:   Head: Normocephalic and atraumatic.   Eyes: EOM are normal. Pupils are equal, round, and reactive to light.   Neck: Normal range of motion. Neck supple.   Musculoskeletal: Normal range of motion. She exhibits no edema and no tenderness.          Back :  FROM wnl   Neurological: She is alert and oriented to person, place, and time. No cranial nerve deficit.   Skin: Skin is warm and dry. No rash noted. She is not diaphoretic. No erythema. No pallor.   Psychiatric: She has a normal mood and affect. Her behavior is normal. Judgment and thought content normal.           Assessment:       Recovering Lumbar strain      Plan:       Proceed with P.T.    Pain free stretching    Patient to follow up with primary care doctor or follow up here if symptoms worsening or not resolving as expected. Patient/family verbalize understanding. Reviewed discharge instructions that are included here with patient, and printed in AVS. Questions answered.

## 2014-10-18 ENCOUNTER — Encounter (INDEPENDENT_AMBULATORY_CARE_PROVIDER_SITE_OTHER): Payer: Self-pay | Admitting: Internal Medicine

## 2014-10-18 ENCOUNTER — Ambulatory Visit (FREE_STANDING_LABORATORY_FACILITY): Payer: No Typology Code available for payment source | Admitting: Internal Medicine

## 2014-10-18 VITALS — BP 116/72 | HR 54 | Temp 97.3°F | Resp 18 | Ht 62.25 in | Wt 188.0 lb

## 2014-10-18 DIAGNOSIS — Z0000 Encounter for general adult medical examination without abnormal findings: Secondary | ICD-10-CM

## 2014-10-18 DIAGNOSIS — F329 Major depressive disorder, single episode, unspecified: Secondary | ICD-10-CM

## 2014-10-18 DIAGNOSIS — A6000 Herpesviral infection of urogenital system, unspecified: Secondary | ICD-10-CM

## 2014-10-18 DIAGNOSIS — E785 Hyperlipidemia, unspecified: Secondary | ICD-10-CM | POA: Insufficient documentation

## 2014-10-18 DIAGNOSIS — F32A Depression, unspecified: Secondary | ICD-10-CM

## 2014-10-18 DIAGNOSIS — F419 Anxiety disorder, unspecified: Secondary | ICD-10-CM

## 2014-10-18 DIAGNOSIS — E784 Other hyperlipidemia: Secondary | ICD-10-CM

## 2014-10-18 DIAGNOSIS — Z23 Encounter for immunization: Secondary | ICD-10-CM

## 2014-10-18 DIAGNOSIS — E7849 Other hyperlipidemia: Secondary | ICD-10-CM

## 2014-10-18 DIAGNOSIS — I10 Essential (primary) hypertension: Secondary | ICD-10-CM

## 2014-10-18 LAB — CBC AND DIFFERENTIAL
Basophils Absolute Automated: 0.04 10*3/uL (ref 0.00–0.20)
Basophils Automated: 1 %
Eosinophils Absolute Automated: 0.16 10*3/uL (ref 0.00–0.70)
Eosinophils Automated: 2 %
Hematocrit: 40.1 % (ref 37.0–47.0)
Hgb: 12.5 g/dL (ref 12.0–16.0)
Immature Granulocytes Absolute: 0.02 10*3/uL
Immature Granulocytes: 0 %
Lymphocytes Absolute Automated: 2.58 10*3/uL (ref 0.50–4.40)
Lymphocytes Automated: 38 %
MCH: 30.7 pg (ref 28.0–32.0)
MCHC: 31.2 g/dL — ABNORMAL LOW (ref 32.0–36.0)
MCV: 98.5 fL (ref 80.0–100.0)
MPV: 11.2 fL (ref 9.4–12.3)
Monocytes Absolute Automated: 0.52 10*3/uL (ref 0.00–1.20)
Monocytes: 8 %
Neutrophils Absolute: 3.47 10*3/uL (ref 1.80–8.10)
Neutrophils: 51 %
Nucleated RBC: 0 /100 WBC (ref 0–1)
Platelets: 266 10*3/uL (ref 140–400)
RBC: 4.07 10*6/uL — ABNORMAL LOW (ref 4.20–5.40)
RDW: 14 % (ref 12–15)
WBC: 6.79 10*3/uL (ref 3.50–10.80)

## 2014-10-18 LAB — URINE MICROSCOPIC

## 2014-10-18 LAB — URINALYSIS
Bilirubin, UA: NEGATIVE
Blood, UA: NEGATIVE
Glucose, UA: NEGATIVE
Ketones UA: NEGATIVE
Nitrite, UA: NEGATIVE
Protein, UR: NEGATIVE
Specific Gravity UA: 1.022 (ref 1.001–1.035)
Urine pH: 7 (ref 5.0–8.0)
Urobilinogen, UA: 1 (ref 0.2–2.0)

## 2014-10-18 LAB — COMPREHENSIVE METABOLIC PANEL
ALT: 14 U/L (ref 0–55)
AST (SGOT): 22 U/L (ref 5–34)
Albumin/Globulin Ratio: 1.1 (ref 0.9–2.2)
Albumin: 3.9 g/dL (ref 3.5–5.0)
Alkaline Phosphatase: 88 U/L (ref 37–106)
BUN: 15 mg/dL (ref 7.0–19.0)
Bilirubin, Total: 0.5 mg/dL (ref 0.1–1.2)
CO2: 31 mEq/L — ABNORMAL HIGH (ref 21–30)
Calcium: 9.6 mg/dL (ref 8.5–10.5)
Chloride: 106 mEq/L (ref 100–111)
Creatinine: 1 mg/dL (ref 0.4–1.5)
Globulin: 3.4 g/dL (ref 2.0–3.7)
Glucose: 90 mg/dL (ref 70–100)
Potassium: 3.9 mEq/L (ref 3.5–5.3)
Protein, Total: 7.3 g/dL (ref 6.0–8.3)
Sodium: 143 mEq/L (ref 135–146)

## 2014-10-18 LAB — LIPID PANEL
Cholesterol / HDL Ratio: 2.4
Cholesterol: 164 mg/dL (ref 0–199)
HDL: 69 mg/dL (ref >40)
LDL Calculated: 83 mg/dL (ref 0–99)
Triglycerides: 58 mg/dL (ref 34–149)
VLDL Calculated: 12 mg/dL (ref 10–40)

## 2014-10-18 LAB — TSH: TSH: 0.95 u[IU]/mL (ref 0.35–4.94)

## 2014-10-18 LAB — HEMOLYSIS INDEX: Hemolysis Index: 6 (ref 0–18)

## 2014-10-18 LAB — GFR: EGFR: >60

## 2014-10-18 LAB — VITAMIN D,25 OH,TOTAL: Vitamin D, 25 OH, Total: 30 ng/mL (ref 30–100)

## 2014-10-18 MED ORDER — VALACYCLOVIR HCL 500 MG PO TABS
500.0000 mg | ORAL_TABLET | Freq: Every day | ORAL | Status: AC
Start: 2014-10-18 — End: 2015-10-18

## 2014-10-18 MED ORDER — ROSUVASTATIN CALCIUM 20 MG PO TABS
10.0000 mg | ORAL_TABLET | Freq: Every day | ORAL | Status: DC
Start: 2014-10-18 — End: 2015-02-06

## 2014-10-18 MED ORDER — SERTRALINE HCL 100 MG PO TABS
100.0000 mg | ORAL_TABLET | Freq: Every evening | ORAL | Status: AC
Start: 2014-10-18 — End: 2015-10-18

## 2014-10-18 MED ORDER — HYDROCHLOROTHIAZIDE 12.5 MG PO TABS
12.5000 mg | ORAL_TABLET | Freq: Every evening | ORAL | Status: AC
Start: 2014-10-18 — End: 2015-10-18

## 2014-10-18 NOTE — Progress Notes (Signed)
Tdap vaccine administered into left deltoid without incident.  Patient tolerated well.  MFG:  [employer]  LOT: [id number]  EXP: 10/19/2016

## 2014-10-18 NOTE — Patient Instructions (Signed)
Whooping Cough (Pertussis) (Adult)  Whooping cough (also called pertussis) is a bacterial infection in the respiratory tract. It is very serious and can lead to severe illness including death when it occurs in infants under one year of age or the elderly; however, in children and adults it usually causes only a mild illness.  Pertussis is highly contagious and is spread through the air by coughing or sneezing. Bordetella pertussus, the pertussis bacteria, is then breathed in by others who are in close contact with the infected person. The illness starts like an ordinary cold with mild cough, congestion and low grade fever. However, the coughing becomes very severe after the first two weeks of illness and may lead to exhaustion and poor appetite in younger children. There may be very thick mucus in the nose and throat that makes breathing difficult. Coughing spells may be prolonged and followed by a loud "whooping" sound when breathing in. Fatigue (feeling tired),gagging, and vomiting may also occur after a coughing spell. Rarely, in the most severe cases, the air passage can become blocked making it impossible to breathe.  Antibiotics are used to treat this illness but after treatment it may take up to three months for the cough to disappear completely.  Vaccination of children prevents pertussis, however the vaccine wears off after 5-10 years. This is why teens and adults who were vaccinated as a child can get a mild form of pertussis. These teens and adults can spread the illness to unvaccinated infants and children. Be sure to ask your healthcare provider whether you or your teen needs a booster vaccination.  Home Care  1. Stay home from work or school until you have completed at least five days of antibiotic treatment or until three weeks or 21 days after the onset of cough, if appropriate antibiotic treatment is not taken. If symptoms are severe, rest at home for the first 2-3 days. When resuming activity,  do not let yourself become overly tired.  2. Avoid exposure to cigarette smoke (yours or others).  3. Before taking medication for fever, pain, and coughing, please consult with your doctor.  4. Your appetite may be poor so a light diet is fine. Avoid dehydration by drinking 6-8 glasses of fluids (water, sport drinks, juices, tea, soup, etc.). Extra fluids will help loosen secretions in the nose and lungs.  Follow-uup care  with your doctor or as advised if you are not improving over the next week.  [NOTE: A radiologist will review any X-rays that were taken. We will notify you of any new findings that may affect your care.]  When to seek medical advice  Call your health care provider right away if any of these occur:   Shortness of breath, noisy breathing, difficulty or pain with breathing   Fast breathing (6-10 yrs: over 30 breaths/min, more than 63 yrs old: over 25 breaths/min)   Cough becomes more severe, with gagging, vomiting or turning blue during the cough   Cough with lots of colored sputum (mucus) or blood in your sputum   Severe headache; face, neck, or ear pain   Fever over 100.4F (38.0C) for more than three days   2000-2015 [employer] [address], [place] [address]. All rights reserved. This information is not intended as a substitute for professional medical care. Always follow your healthcare professional's instructions.

## 2014-10-18 NOTE — Progress Notes (Signed)
Katherine Freeman is a 64 y.o. female here to establish care as a new patient.     HPI-    Reports she has started Glucosamine recently for Arthritis in both knees and back.      PMH-  Past Medical History   Diagnosis Date   . Depression,stable on Zoloft 100 mg.    . Hypertensive disorder,well controlled HCTZ 12.5 mg    . Hyperlipidemia,well controlled on Crestor    . Gastroesophageal reflux disease 3 yrs back,resolved after she lost 30 lbs with diet and exercise.    . Mitral valve prolapse    . Anxiety    . Arthritis    . Genital herpes        PSH-  Past Surgical History   Procedure Laterality Date   . Egd, colonoscopy  01/22/2013     Procedure: EGD, COLONOSCOPY;  Surgeon: Luberta Mutter, MD;  Location: [facility];  Service: Gastroenterology;  Laterality: N/A;  EGD, COLONOSCOPY W/IVA   . Breast surgery     . Arthroscopy knee, diagnostic       right       SH-  History     Social History   . Marital Status: Divorced     Spouse Name: N/A   . Number of Children: 0   . Years of Education: N/A     Occupational History   . RN      Social History Main Topics   . Smoking status: Former Smoker -- 0.25 packs/day for 20 years     Quit date: 01/21/1993   . Smokeless tobacco: Never Used      Comment: off and on, no more than 405 cigarettes/day   . Alcohol Use: Yes      Comment: 1-2 glasses wine 1-2 times/year   . Drug Use: No   . Sexual Activity: Not on file     Other Topics Concern   . Not on file     Social History Narrative       FH-  Family History   Problem Relation Age of Onset   . Heart disease Mother    . Stroke Mother    . Hyperlipidemia Mother    . Heart attack Mother    . Hypertension Mother    . Diabetes Mother    . Cancer Sister    . Cancer Brother        Meds-  Current Outpatient Prescriptions   Medication Sig Dispense Refill   . albuterol (PROAIR HFA) 108 (90 BASE) MCG/ACT inhaler Inhale 2 puffs into the lungs every 4 (four) hours as needed for bronchiospasm and cough. 8.5 g 1   . glucosamine-chondroitin  500-400 MG tablet Take 1 tablet by mouth 3 (three) times daily.     . hydrochlorothiazide (HYDRODIURIL) 12.5 MG tablet Take 12.5 mg by mouth nightly.     . rosuvastatin (CRESTOR) 20 MG tablet Take 10 mg by mouth daily.        . sertraline (ZOLOFT) 100 MG tablet Take 100 mg by mouth nightly.      . valACYclovir HCL (VALTREX) 500 MG tablet TAKE 1 TABLET(S) EVERY DAY BY ORAL ROUTE FOR 90 DAYS.  2     No current facility-administered medications for this visit.       No Known Allergies    Comprehensive review of systems including constitutional, eyes, ears, nose, mouth, throat, cardiovascular, GI, GU, musculoskeletal, integumentary, respiratory, neurologic, psychiatric, and endocrine is negative other than what is  mentioned already in the history of present illness.    The following portions of the patient's history were reviewed and updated as appropriate: allergies, current medications, problem list, past medical history, past social history, past surgical history and past family history.      BP 116/72 mmHg  Pulse 54  Temp(Src) 97.3 F (36.3 C) (Tympanic)  Resp 18  Ht 1.581 m (5' 2.25")  Wt 85.276 kg (188 lb)  BMI 34.12 kg/m2  SpO2 97%  Vitals reviewed.  General:Alert, NAD, well developed, well nourished.   HEENT: sclera is normal, PERRL, EOMI, ears normal, oropharynx clear  Neck: No thyromegaly present. No carotid bruits   Cardiovascular: normal S1 S2 heard, no murmur, rub or gallop   Lungs: Clear to auscultation b/l, normal excursion, no rhonchi, rales or wheezes b/l   Abdomen: +bs, soft, nontender, non-distended, no organomegaly   Extremities: no edema, 2+ pulses lower extremities   Neurological: nonfocal, moves all extremities well   Psychiatric: alert, normal affect     Assessment/Plan:     1. Hyperlipidemia  Well controlled with Crestor 20 mg ,check lipid panel,cmp,tsh.    2. Essential hypertension  Well controlled with weight loss,diet and exercise,continue HCTZ 12.5 mg.    3. Genital herpes  simplex,continue Valtrex prophylaxis.    4. Depression  Stable on Zoloft 100 mg,continue same.    5. Anxiety  Stable on Zoloft 100 mg,continue same.       HEALTH MAINTENANCE:     Counseling/Anticipatory Guidance:  Nutrition, physical activity, healthy weight, injury prevention, misuse of tobacco, alcohol and drugs, sexual behavior and STDs, contraception, dental health, mental health, immunizations, screenings.     Immunizations:   Tdap or TD: Adacel given today.  Pneumonia vaccine: suggest she get Prevnar 13 at age 10.  Flu vaccine:advised to get yearly in the fall  Immunization History   Administered Date(s) Administered   . Hepatitis A ( Adult) 01/21/2007, 04/12/2008   . Pneumococcal 23 valent 01/23/2012   . Td 03/06/2005, 01/23/2012   . Zoster 02/15/2013       Screening:   Colonoscopy: 01/2013 she had Colonoscopy ,no polyps noted,diverticulosis seen.  Breast:done 02/2014,normal.  Cervical:PAP done 02/2014.  Bone density: had one in several years back she reports,she plans to get it done this year with Gyn.    Health Habits:   Exercise: discussed  Nutrition: discussed    BLOODWORK: listed below if ordered; pt is in agreement with what labs are being ordered and understands that there is never any guarantee that insurance will cover the cost of bloodwork.     Patient is alert and oriented x 3. Understands and accepts risks and benefits of current medications and management.  Patient asked appropriate questions which were answered. Pt knows he/she can call with any questions or concerns.     Return in 1 year  Lab results will be relayed to pt via phone/mail.

## 2014-10-28 ENCOUNTER — Encounter (INDEPENDENT_AMBULATORY_CARE_PROVIDER_SITE_OTHER): Payer: Self-pay | Admitting: Internal Medicine

## 2014-12-29 ENCOUNTER — Other Ambulatory Visit: Payer: Self-pay

## 2014-12-29 MED ORDER — MELOXICAM 15 MG PO TABS
ORAL_TABLET | ORAL | 2 refills | Status: DC
Start: 2014-12-29 — End: 2015-02-06
  Filled 2014-12-29: qty 30, 30d supply, fill #0

## 2015-01-19 ENCOUNTER — Observation Stay: Payer: No Typology Code available for payment source | Admitting: Internal Medicine

## 2015-01-19 ENCOUNTER — Observation Stay
Admission: EM | Admit: 2015-01-19 | Discharge: 2015-01-23 | Disposition: A | Discharge status: Home / Self Care | Payer: No Typology Code available for payment source | Attending: Specialist | Admitting: Specialist

## 2015-01-19 DIAGNOSIS — K317 Polyp of stomach and duodenum: Secondary | ICD-10-CM | POA: Insufficient documentation

## 2015-01-19 DIAGNOSIS — E785 Hyperlipidemia, unspecified: Secondary | ICD-10-CM | POA: Insufficient documentation

## 2015-01-19 DIAGNOSIS — R6881 Early satiety: Secondary | ICD-10-CM | POA: Insufficient documentation

## 2015-01-19 DIAGNOSIS — A6000 Herpesviral infection of urogenital system, unspecified: Secondary | ICD-10-CM | POA: Insufficient documentation

## 2015-01-19 DIAGNOSIS — M25562 Pain in left knee: Secondary | ICD-10-CM | POA: Insufficient documentation

## 2015-01-19 DIAGNOSIS — K5730 Diverticulosis of large intestine without perforation or abscess without bleeding: Secondary | ICD-10-CM | POA: Insufficient documentation

## 2015-01-19 DIAGNOSIS — K922 Gastrointestinal hemorrhage, unspecified: Secondary | ICD-10-CM

## 2015-01-19 DIAGNOSIS — F3289 Other specified depressive episodes: Secondary | ICD-10-CM

## 2015-01-19 DIAGNOSIS — M25561 Pain in right knee: Secondary | ICD-10-CM | POA: Insufficient documentation

## 2015-01-19 DIAGNOSIS — J45909 Unspecified asthma, uncomplicated: Secondary | ICD-10-CM | POA: Insufficient documentation

## 2015-01-19 DIAGNOSIS — K921 Melena: Principal | ICD-10-CM | POA: Insufficient documentation

## 2015-01-19 DIAGNOSIS — D62 Acute posthemorrhagic anemia: Secondary | ICD-10-CM | POA: Insufficient documentation

## 2015-01-19 DIAGNOSIS — I341 Nonrheumatic mitral (valve) prolapse: Secondary | ICD-10-CM | POA: Insufficient documentation

## 2015-01-19 DIAGNOSIS — Z87891 Personal history of nicotine dependence: Secondary | ICD-10-CM | POA: Insufficient documentation

## 2015-01-19 DIAGNOSIS — K644 Residual hemorrhoidal skin tags: Secondary | ICD-10-CM | POA: Insufficient documentation

## 2015-01-19 DIAGNOSIS — I10 Essential (primary) hypertension: Secondary | ICD-10-CM | POA: Insufficient documentation

## 2015-01-19 HISTORY — DX: Gastrointestinal hemorrhage, unspecified: K92.2

## 2015-01-19 LAB — BASIC METABOLIC PANEL
BUN: 32 mg/dL — ABNORMAL HIGH (ref 7.0–19.0)
CO2: 25 mEq/L (ref 22–29)
Calcium: 9.4 mg/dL (ref 8.5–10.5)
Chloride: 108 mEq/L (ref 100–111)
Creatinine: 0.9 mg/dL (ref 0.6–1.0)
Glucose: 106 mg/dL — ABNORMAL HIGH (ref 70–100)
Potassium: 3.9 mEq/L (ref 3.5–5.1)
Sodium: 142 mEq/L (ref 136–145)

## 2015-01-19 LAB — CBC AND DIFFERENTIAL
Basophils Absolute Automated: 0.04 10*3/uL (ref 0.00–0.20)
Basophils Automated: 0 %
Eosinophils Absolute Automated: 0.25 10*3/uL (ref 0.00–0.70)
Eosinophils Automated: 2 %
Hematocrit: 35.4 % — ABNORMAL LOW (ref 37.0–47.0)
Hgb: 11.5 g/dL — ABNORMAL LOW (ref 12.0–16.0)
Immature Granulocytes Absolute: 0.04 10*3/uL
Immature Granulocytes: 0 %
Lymphocytes Absolute Automated: 4.91 10*3/uL — ABNORMAL HIGH (ref 0.50–4.40)
Lymphocytes Automated: 38 %
MCH: 31.2 pg (ref 28.0–32.0)
MCHC: 32.5 g/dL (ref 32.0–36.0)
MCV: 95.9 fL (ref 80.0–100.0)
MPV: 9.7 fL (ref 9.4–12.3)
Monocytes Absolute Automated: 0.68 10*3/uL (ref 0.00–1.20)
Monocytes: 5 %
Neutrophils Absolute: 7.1 10*3/uL (ref 1.80–8.10)
Neutrophils: 55 %
Nucleated RBC: 0 /100 WBC (ref 0–1)
Platelets: 287 10*3/uL (ref 140–400)
RBC: 3.69 10*6/uL — ABNORMAL LOW (ref 4.20–5.40)
RDW: 14 % (ref 12–15)
WBC: 13.02 10*3/uL — ABNORMAL HIGH (ref 3.50–10.80)

## 2015-01-19 LAB — PT AND APTT
PT INR: 1.1 (ref 0.9–1.1)
PT: 13.7 s (ref 12.6–15.0)
PTT: 30 s (ref 23–37)

## 2015-01-19 LAB — GFR: EGFR: >60

## 2015-01-19 MED ORDER — SODIUM CHLORIDE 0.9 % IV BOLUS
1000.0000 mL | Freq: Once | INTRAVENOUS | Status: AC
Start: 2015-01-19 — End: 2015-01-19
  Administered 2015-01-19: 1000 mL via INTRAVENOUS

## 2015-01-19 NOTE — ED Provider Notes (Signed)
[facility]  ED PROVIDER NOTE    Diagnosis  Final diagnoses:   Lower GI bleed       Disposition  ED Disposition     Admit Admitting Physician: Brita Romp [[id number]]  Diagnosis: Lower GI bleed [956213]  Estimated Length of Stay: 3 - 5 Days  Tentative Discharge Plan?: Home or Self Care [1]  Patient Class: Inpatient [101]  I certify that inpatient services are medically necessary for this patient. Please see H&P and MD progress notes for additional information about the patient's course of treatment. For Medicare patients, services provided in accordance with 412.3 and expected LOS to be greater than 2 midnights for Medicare patients.: Yes            HPI & ROS  64 y.o. female with hx of HTN, GERD, arthritis, depression p/w rectal bleeding onset 5 hrs PTA. She has released loose bloody stools 4 times since 1700 today. Says her stools had bright red blood with clots. Pt says she has been constipated for the past 2 weeks. Associated with Lt lower abdominal pain, nausea, decreased appetite, feels cold. Denies lightheadedness, dizziness, CP, trouble breathing, no black stools. Surgincal hx colonoscopy 2 yrs ago. Medications currently taking Mobic, Crestor, Zoloft, Valtrex.    GI Physician: Dr. Carolynn Sayers  PCP: Olen Pel    History obtained from: patient    PMD/Specialists: Olen Pel, MD    ROS documented above in HPI.  A Review of Systems was performed. All other systems reviewed and negative.     Physician/Midlevel provider first contact with patient: 01/19/15 2022             Past History  Past Medical History   Diagnosis Date   . Depression    . Hypertensive disorder    . Hyperlipidemia    . Gastroesophageal reflux disease    . Mitral valve prolapse    . Anxiety    . Asthma    . Arthritis    . STD (sexually transmitted disease)      Past Surgical History   Procedure Laterality Date   . Egd, colonoscopy  01/22/2013     Procedure: EGD, COLONOSCOPY;  Surgeon: Luberta Mutter,  MD;  Location: [facility] SURGERY OR;  Service: Gastroenterology;  Laterality: N/A;  EGD, COLONOSCOPY W/IVA   . Breast surgery     . Arthroscopy knee, diagnostic       right       Physical Exam & data  Patient Vitals for the past 24 hrs:   BP Temp Temp src Pulse Resp SpO2 Height Weight   01/20/15 0715 - - - 64 - 100 % - -   01/20/15 0600 - - - - - 98 % - -   01/20/15 0530 119/85 mmHg - - - 16 98 % - -   01/20/15 0500 - - - - - 98 % - -   01/20/15 0423 127/58 mmHg - - 69 15 100 % - -   01/20/15 0130 108/64 mmHg - - - - 99 % - -   01/20/15 0030 114/67 mmHg - - - 16 99 % - -   01/20/15 0000 136/71 mmHg - - - - 98 % - -   01/19/15 2018 156/80 mmHg 98.2 F (36.8 C) Temporal Art 66 18 98 % 5\' 3"  (1.6 m) 86.183 kg   01/19/15 2015 - - - 68 - 98 % - -      Exam:  GEN: Well appearing; No acute distress.   Head: Normocephalic, Atraumatic.    Eyes: PERRL, EOMI.    ENT: Posterior oropharynx wnl. Moist mucus membranes.    Neck: No midline tenderness, full range of motion.    Chest:  CTAB, NO respiratory distress. Non-tender chest wall.     CV: RRR, normal S1 S2, NO murmur.        Abd: soft, NO distention, NO rebound/guarding, Mild abd tenderness.         GU: Rectal grossly bloody with dark maroon stools  Back:  NO CVA tenderness, NO midline tenderness on palpation.       Extremities: NO edema. 2+ pp, NO calf tenderness.        Neuro: grossly neuro intact, moving all extremities, A&Ox3.        Skin: Warm and dry. NO rash.        Psych: Normal affect. Normal behavior.        O2 sat: 98 % on room air; Normal   Monitor - interpreted by me: NSR 60's      Medical Decision Making and ED Course  DDx/MDM:  Lower GIB, anemia, suspect diverticular    ED Course:   12:54 AM Spoke with Dr. Carolynn Sayers pt's GI. Agrees with admission but no longer comes to [facility] requesting GANV.    1:08 AM Spoke with Dr. Clelia Croft of GI. Would like bleeding scan. Depending on results may scope tomorrow. Admit to medicine    D/w Dr. Carleene Overlie will admit to [facility]    Meds  Given  ED Medication Orders     Start Ordered     Status Ordering Provider    01/19/15 2246 01/19/15 2245  sodium chloride 0.9 % bolus 1,000 mL   Once     Route: Intravenous  Ordered Dose: 1,000 mL     Last MAR action:  Stopped Breck Hollinger [place]        New Prescriptions    No medications on file         Results  Results     Procedure Component Value Units Date/Time    Hemoglobin and hematocrit, blood (Q6H) [[id number]]  (Abnormal) Collected:  01/20/15 0606    Specimen Information:  Blood Updated:  01/20/15 0629     Hgb 8.5 (L) g/dL      Hematocrit 40.9 (L) %     Type and Screen [[id number]] Collected:  01/19/15 2304    Specimen Information:  Blood Updated:  01/20/15 0028     ABO Rh A POS      AB Screen Gel NEG     PT/APTT [[id number]] Collected:  01/19/15 2245     PT 13.7 sec Updated:  01/19/15 2319     PT INR 1.1      PT Anticoag. Given Within 48 hrs. None      PTT 30 sec     Basic Metabolic Panel [[id number]]  (Abnormal) Collected:  01/19/15 2245    Specimen Information:  Blood Updated:  01/19/15 2317     Glucose 106 (H) mg/dL      BUN 52.8 (H) mg/dL      Creatinine 0.9 mg/dL      Calcium 9.4 mg/dL      Sodium 413 mEq/L      Potassium 3.9 mEq/L      Chloride 108 mEq/L      CO2 25 mEq/L     GFR [[id number]] Collected:  01/19/15 2245     EGFR >60.0 Updated:  01/19/15 2317    CBC with differential [[id number]]  (Abnormal) Collected:  01/19/15 2245    Specimen Information:  Blood from Blood Updated:  01/19/15 2305     WBC 13.02 (H) x10 3/uL      Hgb 11.5 (L) g/dL      Hematocrit 40.9 (L) %      Platelets 287 x10 3/uL      RBC 3.69 (L) x10 6/uL      MCV 95.9 fL      MCH 31.2 pg      MCHC 32.5 g/dL      RDW 14 %      MPV 9.7 fL      Neutrophils 55 %      Lymphocytes Automated 38 %      Monocytes 5 %      Eosinophils Automated 2 %      Basophils Automated 0 %      Immature Granulocyte 0 %      Nucleated RBC 0 /100 WBC      Neutrophils Absolute 7.10 x10 3/uL      Abs Lymph Automated 4.91 (H) x10 3/uL      Abs Mono  Automated 0.68 x10 3/uL      Abs Eos Automated 0.25 x10 3/uL      Absolute Baso Automated 0.04 x10 3/uL      Absolute Immature Granulocyte 0.04 x10 3/uL         Radiology Results (24 Hour)     Procedure Component Value Units Date/Time    NM GI Blood Loss [[id number]] Resulted:  01/20/15 0243    Order Status:  Sent Updated:  01/20/15 0403        ___________________________  Complexity / Level of Care  First MD: Gerome Sam  Vital Signs and Nursing notes reviewed by me.   Past Medical/Surgical/Family/Social History reviewed by me.  I have reviewed any labs or imaging studies performed.   Discussed patient management with other provider(s): yes   Old records obtained and reviewed by me:yes      ___________________________  Additional History  Family History   Problem Relation Age of Onset   . Heart disease Mother    . Stroke Mother    . Hyperlipidemia Mother    . Heart attack Mother    . Hypertension Mother    . Diabetes Mother    . Cancer Sister    . Cancer Brother      Social History     Social History   . Marital Status: Divorced     Spouse Name: N/A   . Number of Children: 0   . Years of Education: N/A     Occupational History   . RN      Social History Main Topics   . Smoking status: Former Smoker -- 0.25 packs/day for 20 years     Quit date: 01/21/1993   . Smokeless tobacco: Never Used      Comment: off and on, no more than 405 cigarettes/day   . Alcohol Use: Yes      Comment: 1-2 glasses wine 1-2 times/year   . Drug Use: No   . Sexual Activity: Not on file     Other Topics Concern   . Not on file     Social History Narrative     ___________________________  Production assistant, radio  I was acting as a Neurosurgeon for Gerome Sam, MD on [employer]  Treatment Team: Scribe: Verlene Mayer  I am the first provider for this patient and I personally performed the services documented. Treatment Team: Scribe: Verlene Mayer is scribing for me on [place]. This note and the patient instructions accurately  reflect work and decisions made by me.  Gerome Sam, MD    Eduardo Osier, MD  02/02/15 [phone]

## 2015-01-20 ENCOUNTER — Inpatient Hospital Stay: Payer: No Typology Code available for payment source

## 2015-01-20 ENCOUNTER — Telehealth (INDEPENDENT_AMBULATORY_CARE_PROVIDER_SITE_OTHER): Payer: Self-pay

## 2015-01-20 DIAGNOSIS — D62 Acute posthemorrhagic anemia: Secondary | ICD-10-CM

## 2015-01-20 DIAGNOSIS — K922 Gastrointestinal hemorrhage, unspecified: Secondary | ICD-10-CM | POA: Diagnosis present

## 2015-01-20 LAB — HEMOGLOBIN AND HEMATOCRIT, BLOOD
Hematocrit: 26.1 % — ABNORMAL LOW (ref 37.0–47.0)
Hematocrit: 27.1 % — ABNORMAL LOW (ref 37.0–47.0)
Hematocrit: 26.1 % — ABNORMAL LOW (ref 37.0–47.0)
Hgb: 8.5 g/dL — ABNORMAL LOW (ref 12.0–16.0)
Hgb: 8.8 g/dL — ABNORMAL LOW (ref 12.0–16.0)
Hgb: 8.4 g/dL — ABNORMAL LOW (ref 12.0–16.0)

## 2015-01-20 LAB — TYPE AND SCREEN
AB Screen Gel: NEGATIVE
ABO Rh: A POS

## 2015-01-20 MED ORDER — ONDANSETRON 4 MG PO TBDP
4.0000 mg | ORAL_TABLET | Freq: Two times a day (BID) | ORAL | Status: DC | PRN
Start: 2015-01-20 — End: 2015-01-23

## 2015-01-20 MED ORDER — NALOXONE HCL 0.4 MG/ML IJ SOLN
0.2000 mg | INTRAMUSCULAR | Status: DC | PRN
Start: 2015-01-20 — End: 2015-01-23

## 2015-01-20 MED ORDER — ONDANSETRON HCL 4 MG/2ML IJ SOLN
4.0000 mg | Freq: Two times a day (BID) | INTRAMUSCULAR | Status: DC | PRN
Start: 2015-01-20 — End: 2015-01-23
  Filled 2015-01-20: qty 2

## 2015-01-20 MED ORDER — SERTRALINE HCL 50 MG PO TABS
100.0000 mg | ORAL_TABLET | Freq: Every evening | ORAL | Status: DC
Start: 2015-01-20 — End: 2015-01-23
  Administered 2015-01-20 – 2015-01-22 (×3): 100 mg via ORAL
  Filled 2015-01-20 (×4): qty 2

## 2015-01-20 MED ORDER — TECHNETIUM TC 99M-LABELED RED BLOOD CELLS
30.0000 | Freq: Once | Status: AC | PRN
Start: 2015-01-20 — End: 2015-01-20
  Administered 2015-01-20: 30 via INTRAVENOUS
  Filled 2015-01-20: qty 30

## 2015-01-20 MED ORDER — ROSUVASTATIN CALCIUM 10 MG PO TABS
10.0000 mg | ORAL_TABLET | Freq: Every day | ORAL | Status: DC
Start: 2015-01-20 — End: 2015-01-23
  Administered 2015-01-22 (×2): 10 mg via ORAL
  Filled 2015-01-20 (×4): qty 1

## 2015-01-20 MED ORDER — HYDROCHLOROTHIAZIDE 25 MG PO TABS
12.5000 mg | ORAL_TABLET | Freq: Every evening | ORAL | Status: DC
Start: 2015-01-20 — End: 2015-01-20

## 2015-01-20 MED ORDER — VALACYCLOVIR HCL 500 MG PO TABS
500.0000 mg | ORAL_TABLET | Freq: Every day | ORAL | Status: DC
Start: 2015-01-20 — End: 2015-01-23
  Administered 2015-01-22 (×2): 500 mg via ORAL
  Filled 2015-01-20 (×4): qty 1

## 2015-01-20 MED ORDER — LACTATED RINGERS IV SOLN
INTRAVENOUS | Status: DC
Start: 2015-01-20 — End: 2015-01-21
  Administered 2015-01-20: 100 mL/h via INTRAVENOUS

## 2015-01-20 NOTE — Plan of Care (Signed)
Pt care assumed 0545, Pt A&Ox4, VSS, afebrile, medium bloody BM, AUOP, ambulating s difficulty, NPO, awaiting GI consult and bed in [facility]

## 2015-01-20 NOTE — ED Notes (Signed)
Spoke to Dr Lennox Solders for bed board to check on status on room. Bed board notified to page MD at [phone] for. further instructions

## 2015-01-20 NOTE — Progress Notes (Signed)
Pt admitted from ED for rectal bleeding. Pt on H&H monitoring q6hr. Pt is AOx4 c/o mild pain to ABD, but declined pain medication. VS are stable. No c/o sob, fever, chills or nausea. Oriented pt to room and unit policies. Discussed plan of care and fall preventions. Pt was started on IV fluid LR. We will continue to support plan of care.

## 2015-01-20 NOTE — Consults (Signed)
[facility]  CONSULTATION NOTE  [facility] call: [phone], [phone]  [facility] call: [phone]  After hours call [phone]        Date Time: 01/20/2015 5:30 PM  Patient Name: [name],[name]  Requesting Physician: Brita Romp, MD       Reason for Consultation:   GI bleed    Assessment and Plan:   Assessment:  1. Hematochezia, likely diverticular bleed. Last colonoscopy in 2014 with diverticulosis noted throughout the colon. This is the most likely source of bleeding. Bleeding appears to have stopped based on history.   2. Acute post-hemorrhagic anemia    Plan:  1.  Monitor H/H and for signs of further overt bleeding. Transfuse as needed (transfusion threshold Hgb 7)  2.  Clear liquid diet   3.  STAT CTA bleeding protocol if recurrent active bleeding.   4.  No plans for colonoscopy at this time given likely diverticular bleeding which has stopped and low likelihood of alternative diagnosis given recent colonoscopy 2014  5.   If recurrent active bleeding, may need to proceed with colonoscopy vs flexible sigmoidoscopy.     History:   Katherine Freeman is a 64 y.o. female who presents to the hospital on 01/19/2015 with rectal bleeding starting yesterday around 5am. Bleeding was bright red with clots. Last episode of bleeding was last night around 1030 pm. No BM since last night. Patient has had nausea over the past week but no vomiting. She has had no fever or chills. She reports L sided abdominal pain which were sharp but mild and not requiring any medications.     Last colonoscopy performed 2014 with Dr Carolynn Sayers with hemorrhoids and diverticulosis.     +FHx of small bowel malignancy niece in her 28s  Past Medical History:     Past Medical History   Diagnosis Date   . Depression    . Hypertensive disorder    . Hyperlipidemia    . Gastroesophageal reflux disease    . Mitral valve prolapse    . Anxiety    . Asthma    . Arthritis    . STD (sexually transmitted disease)        Past Surgical History:      Past Surgical History   Procedure Laterality Date   . Egd, colonoscopy  01/22/2013     Procedure: EGD, COLONOSCOPY;  Surgeon: Luberta Mutter, MD;  Location: [facility] SURGERY OR;  Service: Gastroenterology;  Laterality: N/A;  EGD, COLONOSCOPY W/IVA   . Breast surgery     . Arthroscopy knee, diagnostic       right       Family History:     Family History   Problem Relation Age of Onset   . Heart disease Mother    . Stroke Mother    . Hyperlipidemia Mother    . Heart attack Mother    . Hypertension Mother    . Diabetes Mother    . Cancer Sister    . Cancer Brother        Social History:     Social History     Social History   . Marital Status: Divorced     Spouse Name: N/A   . Number of Children: 0   . Years of Education: N/A     Occupational History   . RN      Social History Main Topics   . Smoking status: Former Smoker -- 0.25 packs/day for 20 years  Quit date: 01/21/1993   . Smokeless tobacco: Never Used      Comment: off and on, no more than 405 cigarettes/day   . Alcohol Use: Yes      Comment: 1-2 glasses wine 1-2 times/year   . Drug Use: No   . Sexual Activity: Not on file     Other Topics Concern   . Not on file     Social History Narrative       Allergies:   No Known Allergies    Medications:     Current Facility-Administered Medications   Medication Dose Route Frequency   . rosuvastatin  10 mg Oral Daily   . sertraline  100 mg Oral QHS   . valACYclovir HCL  500 mg Oral Daily       Review of Systems:   General:  Patient denies lack of appetite, night sweats, weight loss, fatigue, fever.   HEENT:  Patient denies headache, hoarseness   Cardiovascular:  Patient denies swelling of hands/feet, fainting/blacking out, chest pain.   Respiratory:  Patient denies chronic cough, difficulty breathing, wheezing.   Gastrointestinal:  Patient denies abdominal swelling, vomiting blood, belching, diarrhea, food intolerance,  black stool, bloating, get full quickly at meals, painful swallowing, vomiting, change in bowel  habits, difficulty swallowing, constipation, pain with bowel movement, incontinence of stool, gas/flatulence, heartburn   Genitourinary:  Patient denies blood in urine, dark urine  Musculoskeletal: Patient denies joint pain, joint stiffness, joint swelling.   Skin:  Patient denies itching, rash.   Neurologic:  Patient denies dizziness, loss of consciousness, fainting, confusion  Heme/Lymphatic:  Patient denies easy bruising.     Pertinent positives noted in HPI.    Physical Exam:     Filed Vitals:    01/20/15 1721   BP: 143/65   Pulse: 66   Temp: 98.5 F (36.9 C)   Resp: 18   SpO2: 100%       General appearance: Well developed, well nourished, appears stated age and in NAD  Eyes: Sclera anicteric, pink conjunctivae, no ptosis  ENMT: mucous membranes moist, nose and ears appear normal.  Oropharynx clear.  Chest: Non labored respirations, no audible wheezing, no clubbing or cyanosis  CV:  Regular rate and rhythm, no JVD, no LE edema  Abdomen: soft, non-tender, non-distended, no masses or organomegaly  Skin: Normal color and turgor, no rashes, no suspicious skin lesions noted  Neuro: CN II-XII grossly intact.  No gross movement disorders noted.  Mental status: Appropriate affect, alert and oriented x 3    Labs Reviewed:     Recent Labs      01/20/15   1256  01/20/15   0606  01/19/15   2245   WBC   --    --   13.02*   HGB  8.8*  8.5*  11.5*   HEMATOCRIT  27.1*  26.1*  35.4*   PLATELETS   --    --   287   MCV   --    --   95.9       Recent Labs      01/19/15   2245   SODIUM  142   POTASSIUM  3.9   CHLORIDE  108   CO2  25   BUN  32.0*   CREATININE  0.9   GLUCOSE  106*   CALCIUM  9.4       No results for input(s): AST, ALT, ALKPHOS, BILITOTAL, BILIDIRECT, PROT, ALB in the last 72 hours.  Recent Labs      01/19/15   2245   PTT  30   PT  13.7   PT INR  1.1        Radiology:   Radiological Procedure reviewed:  Bleeding scan 01/20/15  1. Negative for acute gastrointestinal hemorrhage.  2. Recognize activity in the stomach is  probably related to a tracer  labeling issue. This could be confirmed with NG tube placement.      Endoscopy:   EGD 01/22/13 [facility]): polypoid lesion in duodenum +bx    Colon 01/22/13 [facility]): diverticulosis throughout colon, mod-sized external hemorrhoid

## 2015-01-20 NOTE — H&P (Signed)
ADMISSION HISTORY AND PHYSICAL EXAM    Date Time: 01/20/2015 3:59 AM  Patient Name: Katherine Freeman  Attending Physician: Ty Hilts*  Primary Care Physician: Olen Pel, MD    CC: lower GI bleed      Assessment:   Active Problems:    Lower GI bleed        64 yo female with h/o diverticulosis p/w acute painless hematochezia.   Likely posthemorrhagic anemia- mild  No symptoms from anemia  H/o diverticulosis per C scopy 2 years ago as well as external hemorrhoids.     -Admit for observation.   Bleeding scan done, results pending.   Type and screen and monitor H/H  PRBC  transfusion prn.   Her GIB seems to have stopped.   Colon prep for possible C scopy today.   Clear liquid diet.   Follow up bleeding scan results.   GI aware, will see in AM.   No longer on PPI for GERD  Hold all AC.   Currently HD stable but monitor in [facility] overnight for 12 hours given degree of bleeding.     HTN/HLD/asthma/depression: continue current medicines.     DVT ppx: scds  Diet: clear   Activity ad lib      Disposition:     Today's date: 01/20/2015  Admit Date: 01/19/2015 10:26 PM  Anticipated medical stability for discharge:01/21/15  Service status: observation  Reason for ongoing hospitalization: GI bleed  Anticipated discharge needs: none    History of Presenting Illness:   Katherine Freeman is a 64 y.o. female who presents to the hospital with 4 episodes of painless rectal bleeding with fresh blood and clots/no melena. She has had cramps in the mid abdomen on and off for the past week or so. She also had nausea during that time. She was taking Mobic for bilateral knee pain in the past month and thought these symptoms were related to it. Se has been trying to eat light with soups and such during the above time period. Yesterday PM she started having the painless rectal bleeding.She waited about 5 hours before coming. She has had a total of 5 episodes, last episode while in the ED at around 10PM, none since. No prior  h/o of same. She had EGD and C scopy in 2014 which showed marked diverticulosis of colon and polypoid lesion in duodenum. She has been constipated for the past two weeks as well.     Past Medical History:     Past Medical History   Diagnosis Date   . Depression    . Hypertensive disorder    . Hyperlipidemia    . Gastroesophageal reflux disease    . Mitral valve prolapse    . Anxiety    . Asthma    . Arthritis    . STD (sexually transmitted disease)        Available old records reviewed, including:  Epic(outpatient records)    Past Surgical History:     Past Surgical History   Procedure Laterality Date   . Egd, colonoscopy  01/22/2013     Procedure: EGD, COLONOSCOPY;  Surgeon: Luberta Mutter, MD;  Location: [facility] SURG[facility] OR;  Service: Gastroenterology;  Laterality: N/A;  EGD, COLONOSCOPY W/IVA   . Breast surgery     . Arthroscopy knee, diagnostic       right       Family History:     Family History   Problem Relation Age of Onset   . Heart  disease Mother    . Stroke Mother    . Hyperlipidemia Mother    . Heart attack Mother    . Hypertension Mother    . Diabetes Mother    . Cancer Sister    . Cancer Brother          Social History:     History   Smoking status   . Former Smoker -- 0.25 packs/day for 20 years   . Quit date: 01/21/1993   Smokeless tobacco   . Never Used     Comment: off and on, no more than 405 cigarettes/day     History   Alcohol Use   . Yes     Comment: 1-2 glasses wine 1-2 times/year     History   Drug Use No       Allergies:   No Known Allergies    Medications:     Current/Home Medications    ALBUTEROL (PROAIR HFA) 108 (90 BASE) MCG/ACT INHALER    Inhale 2 puffs into the lungs every 4 (four) hours as needed for bronchiospasm and cough.    GLUCOSAMINE-CHONDROITIN 500-400 MG TABLET    Take 1 tablet by mouth 3 (three) times daily.    HYDROCHLOROTHIAZIDE (HYDRODIURIL) 12.5 MG TABLET    Take 1 tablet (12.5 mg total) by mouth nightly.    MELOXICAM (MOBIC) 15 MG TABLET    Take 1 tablet by mouth every  evening    ROSUVASTATIN (CRESTOR) 20 MG TABLET    Take 0.5 tablets (10 mg total) by mouth daily.    SERTRALINE (ZOLOFT) 100 MG TABLET    Take 1 tablet (100 mg total) by mouth nightly.    VALACYCLOVIR HCL (VALTREX) 500 MG TABLET    Take 1 tablet (500 mg total) by mouth daily.         Method by which medications were confirmed on admission: patient    Review of Systems:   All other systems were reviewed and are negative except: as per HPI    Physical Exam:   Patient Vitals for the past 24 hrs:   BP Temp Temp src Pulse Resp SpO2 Height Weight   01/20/15 0130 108/64 mmHg - - - - 99 % - -   01/20/15 0030 114/67 mmHg - - - 16 99 % - -   01/20/15 0000 136/71 mmHg - - - - 98 % - -   01/19/15 2018 156/80 mmHg 98.2 F (36.8 C) Temporal Art 66 18 98 % 1.6 m (5\' 3" ) 86.183 kg (190 lb)   01/19/15 2015 - - - 68 - 98 % - -     Body mass index is 33.67 kg/(m^2).  No intake or output data in the 24 hours ending 01/20/15 0359    General: awake, alert, oriented x 3; no acute distress.  HEENT: perrla, eomi, sclera anicteric  oropharynx clear without lesions, mucous membranes moist  Neck: supple, no lymphadenopathy, no thyromegaly, no JVD, no carotid bruits  Cardiovascular: regular rate and rhythm, no murmurs, rubs or gallops  Lungs: clear to auscultation bilaterally, without wheezing, rhonchi, or rales  Abdomen: soft, non-tender, non-distended; no palpable masses, no hepatosplenomegaly, normoactive bowel sounds, no rebound or guarding  Extremities: no clubbing, cyanosis, or edema  Neuro: cranial nerves grossly intact, strength 5/5 in upper and lower extremities, sensation intact,   Skin: no rashes or lesions noted        Labs:     Results     Procedure Component Value Units  Date/Time    Type and Screen [[id number]] Collected:  01/19/15 2304    Specimen Information:  Blood Updated:  01/20/15 0028     ABO Rh A POS      AB Screen Gel NEG     PT/APTT [[id number]] Collected:  01/19/15 2245     PT 13.7 sec Updated:  01/19/15 2319     PT INR  1.1      PT Anticoag. Given Within 48 hrs. None      PTT 30 sec     Basic Metabolic Panel [[id number]]  (Abnormal) Collected:  01/19/15 2245    Specimen Information:  Blood Updated:  01/19/15 2317     Glucose 106 (H) mg/dL      BUN 08.6 (H) mg/dL      Creatinine 0.9 mg/dL      Calcium 9.4 mg/dL      Sodium 578 mEq/L      Potassium 3.9 mEq/L      Chloride 108 mEq/L      CO2 25 mEq/L     GFR [[id number]] Collected:  01/19/15 2245     EGFR >60.0 Updated:  01/19/15 2317    CBC with differential [[id number]]  (Abnormal) Collected:  01/19/15 2245    Specimen Information:  Blood from Blood Updated:  01/19/15 2305     WBC 13.02 (H) x10 3/uL      Hgb 11.5 (L) g/dL      Hematocrit 27.2 (L) %      Platelets 287 x10 3/uL      RBC 3.69 (L) x10 6/uL      MCV 95.9 fL      MCH 31.2 pg      MCHC 32.5 g/dL      RDW 14 %      MPV 9.7 fL      Neutrophils 55 %      Lymphocytes Automated 38 %      Monocytes 5 %      Eosinophils Automated 2 %      Basophils Automated 0 %      Immature Granulocyte 0 %      Nucleated RBC 0 /100 WBC      Neutrophils Absolute 7.10 x10 3/uL      Abs Lymph Automated 4.91 (H) x10 3/uL      Abs Mono Automated 0.68 x10 3/uL      Abs Eos Automated 0.25 x10 3/uL      Absolute Baso Automated 0.04 x10 3/uL      Absolute Immature Granulocyte 0.04 x10 3/uL           Imaging personally reviewed, including: bleeding scan    Safety Checklist  DVT prophylaxis:  CHEST guideline (See page [id number]) scds   Foley:  [place] Rn Foley protocol Not present   IVs:  peripheral   PT/OT: NA   Daily CBC & or Chem ordered:  SHM/ABIM guidelines (see #5)    Reference for approximate charges of common labs: CBC auto diff - $76  BMP - $99  Mg - $79    Signed by: Brita Romp, MD   ZD:[id number], Ansel Bong, MD

## 2015-01-20 NOTE — Plan of Care (Addendum)
Patient seen and examined by me.  Agree with prior A/P as documented by Dr. Carleene Overlie.    On exam, patient appears comfortable; in NAD.  A & O x 3.  CV: rrr nl s1 s2.  Pulm: CTAB.  Abd: soft, NTND, NABS.    Hg dropped from 11.5 --> 8.5.    NM scan did not show e/o active bleeding.    Likely LGIB with BRBPR from diverticulosis (noted on colo in 2014) with acute blood loss anemia.    Plan:  q6h H&H  Transfuse for Hg < 7  GI consult requested and appreciated  Clears for now  Stat CTA if recurrent bleeding  Supportive care  May require further visualization if recurrent bleeding    Given stable VS and no further bleeding, will change to observation and continue to monitor H&H.      Roe Coombs, MD

## 2015-01-21 DIAGNOSIS — I10 Essential (primary) hypertension: Secondary | ICD-10-CM

## 2015-01-21 DIAGNOSIS — F328 Other depressive episodes: Secondary | ICD-10-CM

## 2015-01-21 LAB — IRON PROFILE
Iron Saturation: 29 % (ref 15–50)
Iron: 74 ug/dL (ref 40–145)
TIBC: 251 ug/dL — ABNORMAL LOW (ref 265–497)
UIBC: 177 ug/dL (ref 126–382)

## 2015-01-21 LAB — HEMOGLOBIN AND HEMATOCRIT, BLOOD
Hematocrit: 24.1 % — ABNORMAL LOW (ref 37.0–47.0)
Hematocrit: 26.9 % — ABNORMAL LOW (ref 37.0–47.0)
Hematocrit: 28.3 % — ABNORMAL LOW (ref 37.0–47.0)
Hgb: 7.8 g/dL — ABNORMAL LOW (ref 12.0–16.0)
Hgb: 8.7 g/dL — ABNORMAL LOW (ref 12.0–16.0)
Hgb: 8.7 g/dL — ABNORMAL LOW (ref 12.0–16.0)

## 2015-01-21 LAB — HEMOLYSIS INDEX: Hemolysis Index: 6 (ref 0–18)

## 2015-01-21 NOTE — Progress Notes (Signed)
______________________________________________________________________               Principal Problem:  Lower GI bleed    Active Problems:  Patient Active Problem List   Diagnosis   . Hyperlipidemia   . Hypertensive disorder   . Herpes, genital   . Depression   . Anxiety   . Lower GI bleed   . Acute blood loss anemia       HPI:  64 yo female who presented with hematochezia. Ha history of colonic diverticulosis diagnosed by colonoscopy in 2014. Bleeding scan was negative for active bleeder.Stools are now black/tarry  and H&H are stable.Patient has also been taking Mobic for pain, and EGD and colonoscopy are scheduled for Monday 9/19      Interval History:  Afebrile, VS stable.  H&H 8.7/26.9  She has no complaints            Physical Exam:  Temp:  [97.1 F (36.2 C)-98.9 F (37.2 C)] 97.9 F (36.6 C)  Heart Rate:  [66-71] 66  Resp Rate:  [16-18] 16  BP: (115-143)/(56-74) 115/56 mmHg  In no distress. Lungs clear, heart  Regular. Abdomen soft, no tenderness  No leg edema      Assessment and Plan:   Anemia 2/2 to acute blood loss  GI bleeding  EGD and Colonoscopy on Monday                    _______________________________________________________________________    Labs      Recent Labs  Lab 01/21/15  0815 01/21/15  0202 01/20/15  2008  01/19/15  2245   WBC  --   --   --   --  13.02*   RBC  --   --   --   --  3.69*   HGB 8.7* 7.8* 8.4* More results in Results Review 11.5*   HEMATOCRIT 26.9* 24.1* 26.1* More results in Results Review 35.4*   GLUCOSE  --   --   --   --  106*   BUN  --   --   --   --  32.0*   CREATININE  --   --   --   --  0.9   CALCIUM  --   --   --   --  9.4   SODIUM  --   --   --   --  142   POTASSIUM  --   --   --   --  3.9   CHLORIDE  --   --   --   --  108   CO2  --   --   --   --  25   More results in Results Review = values in this interval not displayed.                 Last 24 Hour Radiology:  Radiology results reviewed        _  Last 24 Hour Intake/Output:    Intake/Output Summary  (Last 24 hours) at 01/21/15 1426  Last data filed at 01/21/15 0500   Gross per 24 hour   Intake    734 ml   Output    900 ml   Net   -166 ml                 Scheduled Meds:  Current Facility-Administered Medications   Medication Dose Route Frequency   . rosuvastatin  10 mg Oral Daily   . sertraline  100 mg Oral QHS   . valACYclovir HCL  500 mg Oral Daily               PRN Meds:  naloxone, ondansetron **OR** ondansetron    Continuous Infusions:               LDA Flowsheet:  Patient Lines/Drains/Airways Status    Active PICC Line / CVC Line / PIV Line / Drain / Airway / Intraosseous Line / Epidural Line / ART Line / Line / Wound / Pressure Ulcer / NG/OG Tube     Name:   Placement date:   Placement time:   Site:   Days:    Peripheral IV 01/19/15 Right Antecubital  01/19/15   2248   Antecubital   1                  Safety Checklist:     DVT prophylaxis:  CHEST guideline (See page [id number]) Chemical and / or mechanical ppx NOT indicated or contraindicated: Active Bleeding    Foley: Not present   IVs:  Peripheral IV   PT/OT: Not needed     Disposition:     Today's date: 01/21/2015  Anticipated medical stability for discharge: September,  19 - Afternoon  Service status: Inpatient: risk of morbidity and mortality  Reason for ongoing hospitalization:waiting for EGD and Colonoscopy  Anticipated discharge needs:none                     _

## 2015-01-21 NOTE — Progress Notes (Signed)
[facility]  PROGRESS NOTE  [facility] call: [phone], [phone]  [facility] call: [phone]  After hours call [phone]    Date Time: 01/21/2015 12:45 PM  Patient Name: Katherine Freeman ([facility]      Chief Complaint:   Hematochezia     Assessment and Plan:   Assessment:  1. GI bleed, likely diverticular bleed but cannot rule out other source given now melena (possibly old blood), recent NSAID use, early satiety. Last colonoscopy in 2014 with diverticulosis noted throughout the colon. This is the most likely source of bleeding.  2. Acute post-hemorrhagic anemia  3. Early satiety, decreased appetite    Plan:  1.Plan for EGD/colonoscopy Monday.   2. Preparation per orders  3. Clear liquid diet Sunday. NPO at midnight pre-endoscopic evaluation  4. PPI PO BID    Subjective:   Pt with slight drop in hgb. She had a few small bowel movements that were formed and black O/N. She denies BRBPR since arrival. She has had nausea and decreased appetite since August. She was taking Mobic for knee bursitis since 8/24. She denies heartburn, unintentional weight loss, vomiting.    She was constipated prior to onset of BRBPR.    She denies other NSAID use. Denies cigarette use (quit ~30 years ago) and drinks rare alcohol. Family history of SB cancer (niece deceased at 75s), but otherwise no GI malignancy.    Medications:     Current Facility-Administered Medications   Medication Dose Route Frequency   . rosuvastatin  10 mg Oral Daily   . sertraline  100 mg Oral QHS   . valACYclovir HCL  500 mg Oral Daily       Review of Systems:   General:  Patient denies lack of appetite, night sweats, weight loss, fatigue, fever.   HEENT:  Patient denies headache, hoarseness   Cardiovascular:  Patient denies swelling of hands/feet, fainting/blacking out, chest pain.   Respiratory:  Patient denies chronic cough, difficulty breathing, wheezing.   Gastrointestinal:  Patient denies abdominal swelling, vomiting blood, belching, diarrhea, food  intolerance, abdominal pain, bloating, get full quickly at meals, painful swallowing, vomiting, change in bowel habits, difficulty swallowing, bloody stool, constipation, pain with bowel movement, incontinence of stool, gas/flatulence, heartburn   Genitourinary:  Patient denies blood in urine, dark urine  Musculoskeletal: Patient denies joint pain, joint stiffness, joint swelling.   Skin:  Patient denies itching, rash.   Neurologic:  Patient denies dizziness, loss of consciousness, fainting, confusion  Heme/Lymphatic:  Patient denies easy bruising.       Pertinent positives noted in HPI.    Physical Exam:     Filed Vitals:    01/21/15 0720   BP: 115/56   Pulse: 66   Temp: 97.9 F (36.6 C)   Resp: 16   SpO2: 100%     General appearance: Well developed, well nourished, appears stated age and in NAD  Eyes: Sclera anicteric, pink conjunctivae, no ptosis  ENMT: mucous membranes moist, nose and ears appear normal.  Oropharynx clear.  Chest: Non labored respirations, no audible wheezing, no clubbing or cyanosis  CV:  Regular rate and rhythm, no JVD, no LE edema  Abdomen: soft, non-tender, non-distended,no masses or organomegaly  Skin: Normal color and turgor, no rashes, no suspicious skin lesions noted  Neuro: CN II-XII grossly intact.  No gross movement disorders noted.  Mental status: Appropriate affect, alert and oriented x 3      Labs:     Recent Labs  01/21/15   0815  01/21/15   0202   01/19/15   2245   WBC   --    --    --   13.02*   HGB  8.7*  7.8*   < >  11.5*   HEMATOCRIT  26.9*  24.1*   < >  35.4*   PLATELETS   --    --    --   287   MCV   --    --    --   95.9    < > = values in this interval not displayed.       Recent Labs      01/19/15   2245   SODIUM  142   POTASSIUM  3.9   CHLORIDE  108   CO2  25   BUN  32.0*   CREATININE  0.9   GLUCOSE  106*   CALCIUM  9.4       No results for input(s): AST, ALT, ALKPHOS, BILITOTAL, BILIDIRECT, PROT, ALB in the last 72 hours.    Recent Labs      01/19/15   2245   PTT   30   PT  13.7   PT INR  1.1        Radiology:   Radiological Procedure reviewed:    Bleeding scan 01/20/15  1. Negative for acute gastrointestinal hemorrhage.  2. Recognize activity in the stomach is probably related to a tracer  labeling issue. This could be confirmed with NG tube placement.    Endoscopy:     EGD 01/22/13 [facility]): polypoid lesion in duodenum +bx    Colon 01/22/13 [facility]): diverticulosis throughout colon, mod-sized external hemorrhoid

## 2015-01-21 NOTE — Plan of Care (Signed)
Problem: Safety  Goal: Patient will be free from injury during hospitalization  Outcome: Progressing  Pt A/Ox4, VSS. Monitor H/H q6. Pt refuses IV fluids, but is drinking fluids. Clear liquid diet. Pt requested to have crestor and valtrex at night like she does at home, pharmacy notified of time change. Will continue to monitor pt.

## 2015-01-21 NOTE — Plan of Care (Signed)
Problem: Safety  Goal: Patient will be free from injury during hospitalization  Outcome: Progressing  Pt is low fall, uses call button appropriately.      Problem: Pain  Goal: Patient's pain/discomfort is manageable  Outcome: Progressing  Pt reported 3/10 mild abdominal pain, declined prn medications.      Notes:  Maintained IVF, monitored H/H, charted strict I/Os.  Per pt had 1 small soft BM with some blood. Pt able to sleep throughout night.  Cont to monitor.

## 2015-01-21 NOTE — UM Notes (Signed)
Guideline: General Criteria: Observation Care    Admit to observation on 01/20/15 @1349     History of Presenting Illness: 64 y.o. female who presents to the hospital with 4 episodes of painless rectal bleeding with fresh blood and clots/no melena. She has had a total of 5 episodes, last episode while in the ED    ED Care: IVF bolus 1L, ultratag IV, crestor 10mg  po, valtrex 500mg  po     VS: T98.2, P66, O2 98%, R18, BP 156/80    Labs: WBC 13.02, glucose 106, BUN 32    Imaging: [facility] GI - Negative for acute gastrointestinal hemorrhage.  2. Recognize activity in the stomach is probably related to a tracer labeling issue. This could be confirmed with NG tube placement.    GI Plan:   1. Monitor H/H and for signs of further overt bleeding. Transfuse as needed (transfusion threshold Hgb 7)  2. Clear liquid diet   3. STAT CTA bleeding protocol if recurrent active bleeding.   4. No plans for colonoscopy at this time given likely diverticular bleeding which has stopped and low likelihood of alternative diagnosis given recent colonoscopy 2014  5. If recurrent active bleeding, may need to proceed with colonoscopy vs flexible sigmoidoscopy.     MEDICINE Plan:   q6h H&H  Transfuse for Hg < 7  GI consult requested and appreciated  Clears for now  Stat CTA if recurrent bleeding  Supportive care  May require further visualization if recurrent bleeding    Dispo: Anticipated medical stability for discharge:01/21/15  Service status: observation  Reason for ongoing hospitalization: GI bleed  Anticipated discharge needs: none    Jiles Prows, RN, BSN, MHA, [facility]  Utilization Review Nurse  [phone] (voicemail)  [phone] (phone)

## 2015-01-22 LAB — HEMOGLOBIN AND HEMATOCRIT, BLOOD
Hematocrit: 25.6 % — ABNORMAL LOW (ref 37.0–47.0)
Hematocrit: 27.9 % — ABNORMAL LOW (ref 37.0–47.0)
Hgb: 8.2 g/dL — ABNORMAL LOW (ref 12.0–16.0)
Hgb: 8.8 g/dL — ABNORMAL LOW (ref 12.0–16.0)

## 2015-01-22 LAB — FERRITIN: Ferritin: 48.38 ng/mL (ref 4.60–204.00)

## 2015-01-22 MED ORDER — PEG 3350-KCL-NABCB-NACL-NASULF 236 G PO SOLR
2000.0000 mL | Freq: Once | ORAL | Status: DC
Start: 2015-01-23 — End: 2015-01-23

## 2015-01-22 MED ORDER — MAGNESIUM CITRATE 1.745 GM/30ML PO SOLN
250.0000 mL | Freq: Once | ORAL | Status: AC
Start: 2015-01-22 — End: 2015-01-22
  Administered 2015-01-22: 250 mL via ORAL
  Filled 2015-01-22: qty 296

## 2015-01-22 MED ORDER — PEG 3350-KCL-NABCB-NACL-NASULF 236 G PO SOLR
2000.0000 mL | Freq: Once | ORAL | Status: DC
Start: 2015-01-22 — End: 2015-01-23
  Filled 2015-01-22: qty 4000

## 2015-01-22 NOTE — Progress Notes (Signed)
On liquid diet in preparation for EGD and colonoscopy.  Refused to take Golytely and requested Mag Citrate instead  VS stable. No fresh blood in stools

## 2015-01-22 NOTE — Progress Notes (Signed)
Pt was unable to drink Glycol, nurse made a cll to Dr. Kelby Aline who ordered magnesium citrate. Awaiting for pharmacy to deliver.

## 2015-01-22 NOTE — Plan of Care (Addendum)
Problem: Pain  Goal: Patient's pain/discomfort is manageable  Outcome: Progressing  Pt reports no active bleeding, sob, abd pain or any distress. She is on clear liquid diet and will start bowel preparation tonight for endo and colonoscopy.

## 2015-01-22 NOTE — Plan of Care (Signed)
Problem: Pain  Goal: Patient's pain/discomfort is manageable  Outcome: Progressing  Pt reported no complaints, acknowledged high tolerance.  Pt able to sleep and get uninterrupted rest.    Notes:  Per pt had 3 urine occurrences and 1 small BM with little blood.

## 2015-01-23 ENCOUNTER — Encounter
Admission: EM | Disposition: A | Discharge status: Home / Self Care | Payer: Self-pay | Source: Home / Self Care | Attending: Specialist

## 2015-01-23 ENCOUNTER — Observation Stay: Payer: No Typology Code available for payment source | Admitting: Anesthesiology

## 2015-01-23 ENCOUNTER — Ambulatory Visit: Payer: Self-pay

## 2015-01-23 ENCOUNTER — Telehealth (INDEPENDENT_AMBULATORY_CARE_PROVIDER_SITE_OTHER): Payer: Self-pay

## 2015-01-23 DIAGNOSIS — K648 Other hemorrhoids: Secondary | ICD-10-CM

## 2015-01-23 DIAGNOSIS — K317 Polyp of stomach and duodenum: Secondary | ICD-10-CM

## 2015-01-23 HISTORY — PX: EGD, COLONOSCOPY: SHX3799

## 2015-01-23 LAB — HEMOGLOBIN AND HEMATOCRIT, BLOOD
Hematocrit: 23.8 % — ABNORMAL LOW (ref 37.0–47.0)
Hgb: 7.7 g/dL — ABNORMAL LOW (ref 12.0–16.0)

## 2015-01-23 SURGERY — EGD, COLONOSCOPY
Anesthesia: Anesthesia General | Site: Mouth

## 2015-01-23 MED ORDER — PSYLLIUM 58.6 % PO POWD
1.0000 [tbs_us] | Freq: Three times a day (TID) | ORAL | Status: DC
Start: 2015-01-23 — End: 2015-02-06

## 2015-01-23 MED ORDER — PHENYLEPHRINE HCL 10 MG/ML IV SOLN (WRAP)
Status: DC | PRN
Start: 2015-01-23 — End: 2015-01-23
  Administered 2015-01-23: 100 ug via INTRAVENOUS

## 2015-01-23 MED ORDER — MAGNESIUM CITRATE 1.745 GM/30ML PO SOLN
296.0000 mL | Freq: Once | ORAL | Status: AC
Start: 2015-01-23 — End: 2015-01-23
  Administered 2015-01-23: 296 mL via ORAL
  Filled 2015-01-23: qty 296

## 2015-01-23 MED ORDER — LIDOCAINE HCL 2 % IJ SOLN
INTRAMUSCULAR | Status: DC | PRN
Start: 2015-01-23 — End: 2015-01-23
  Administered 2015-01-23: 100 mg

## 2015-01-23 MED ORDER — PHENYLEPHRINE 100 MCG/ML IN NACL 0.9% IV SOSY
PREFILLED_SYRINGE | INTRAVENOUS | Status: AC
Start: 2015-01-23 — End: ?
  Filled 2015-01-23: qty 5

## 2015-01-23 MED ORDER — LACTATED RINGERS IV SOLN
INTRAVENOUS | Status: DC | PRN
Start: 2015-01-23 — End: 2015-01-23

## 2015-01-23 MED ORDER — PROPOFOL 10 MG/ML IV EMUL (WRAP)
INTRAVENOUS | Status: DC | PRN
Start: 2015-01-23 — End: 2015-01-23
  Administered 2015-01-23: 200 ug/kg/min via INTRAVENOUS

## 2015-01-23 MED ORDER — PROPOFOL 10 MG/ML IV EMUL (WRAP)
INTRAVENOUS | Status: AC
Start: 2015-01-23 — End: ?
  Filled 2015-01-23: qty 50

## 2015-01-23 MED ORDER — PROPOFOL 10 MG/ML IV EMUL (WRAP)
INTRAVENOUS | Status: DC | PRN
Start: 2015-01-23 — End: 2015-01-23
  Administered 2015-01-23: 20 mg via INTRAVENOUS
  Administered 2015-01-23: 120 mg via INTRAVENOUS
  Administered 2015-01-23 (×2): 20 mg via INTRAVENOUS

## 2015-01-23 MED ORDER — LIDOCAINE HCL (PF) 2 % IJ SOLN
INTRAMUSCULAR | Status: AC
Start: 2015-01-23 — End: ?
  Filled 2015-01-23: qty 5

## 2015-01-23 SURGICAL SUPPLY — 33 items
BLOCK BITE MAXI 60FR LF STRD STRAP SDPRT (Procedure Accessories) ×1
BLOCK BITE OD60 FR STURDY STRAP SIDEPORT (Procedure Accessories) ×1
BLOCK BITE OD60 FR STURDY STRAP SIDEPORT DENTAL RETENTION RIM MAXI (Procedure Accessories) ×1 IMPLANT
CANISTER SCT 1500CC SAFELINER LF NS SMRG (Suction) ×1
CANISTER SUCTION 1500 CC SEMIRIGID 1 (Suction) ×1
CANISTER SUCTION 1500 CC SEMIRIGID 1 ELBOW FILTER SELF ALIGN LID (Suction) ×1 IMPLANT
CANNULA NASAL OD6 MM ID22 MM L7 FT (Cannula) ×1
CANNULA NASAL OD6 MM ID22 MM L7 FT SALTER-STYLE LUER-LOK ADULT O2 (Cannula) ×1 IMPLANT
CANNULA NSL SLTR LL 6MM 22MM 7FT LF NS O (Cannula) ×1
CONTAINER HISTOLOGY 60 ML 30 ML GRADUATE LEAK RESISTANT O RING PREFILL (Procedure Accessories) ×2 IMPLANT
DEVICE ENDOSCOPIC RAPID EXCHANGE BIOPSY (Procedure Accessories) ×2
DEVICE ENDOSCOPIC RAPID EXCHANGE BIOPSY CAP OLYMPUS (Procedure Accessories) ×2 IMPLANT
DEVICE ESCP STRL RX BX CAP DISP OLMPS (Procedure Accessories) ×2
FORCEPS BIOPSY L240 CM MICROMESH TEETH STREAMLINE CATHETER NEEDLE (Instrument) ×1 IMPLANT
FORCEPS BIOPSY L240 CM STANDARD CAPACITY (Instrument) ×1
FORCEPS BX STD CPC RJ 4 2.2MM 240CM STRL (Instrument) ×1
GLOVES EXAM NITRILE ETS LG NS (Glove) ×2 IMPLANT
GOWN ISL PP PE REG LG LF FULL BCK NK TIE (Gown) ×2
GOWN ISOLATION REGULAR LARGE FULL BACK NECK TIE ELASTIC CUFF (Gown) ×1 IMPLANT
JELLY KY LUBRICATNG 2 OZ FLIP (Procedure Accessories) ×2 IMPLANT
SET CAP 24 HOUR ERBEFLOW TUBING ENDOSCOPY PUMP (Endoscopic Supplies) ×1 IMPLANT
SET TUBING ERBEFLOW STRL CAP 24 HR DISP (Endoscopic Supplies) ×2
SOL FORMALIN 10% PREFILL 30ML (Procedure Accessories) ×4
SPONGE GAUZE L4 IN X W4 IN 4 PLY HIGH (Sponge) ×1
SPONGE GAUZE L4 IN X W4 IN 4 PLY NONWOVEN LINT FREE CURITY RAYON (Sponge) ×1 IMPLANT
SPONGE GZE RYN PLSTR CRTY 4X4IN LF NS 4 (Sponge) ×1
SYRINGE 50 ML GRADUATE NONPYROGENIC DEHP (Syringes, Needles) ×1
SYRINGE 50 ML GRADUATE NONPYROGENIC DEHP FREE PVC FREE BD MEDICAL (Syringes, Needles) ×1 IMPLANT
SYRINGE MED 50ML LF STRL GRAD N-PYRG (Syringes, Needles) ×1
TUBING ENDOSCOPY EXTENSION (Endoscopic Supplies) ×2 IMPLANT
WATER STERILE PLASTIC POUR BOTTLE 1000 (Irrigation Solutions) ×2
WATER STERILE PLASTIC POUR BOTTLE 1000 ML (Irrigation Solutions) ×2 IMPLANT
WATER STRL 1000ML LF PLS PR BTL (Irrigation Solutions) ×2

## 2015-01-23 NOTE — Progress Notes (Signed)
Explained discharge instructions to patient who verbalized understanding of medications and follow-up appointments.   Pt. Denies dizziness while ambulating from unit.

## 2015-01-23 NOTE — Anesthesia Preprocedure Evaluation (Signed)
Anesthesia Evaluation    AIRWAY    Mallampati: I    TM distance: >3 FB  Neck ROM: full  Mouth Opening:full   CARDIOVASCULAR           DENTAL    no notable dental hx     PULMONARY         OTHER FINDINGS    NPO since MN                  Anesthesia Plan    ASA 2     general                     intravenous induction   Detailed anesthesia plan: general IV        Post op pain management: per surgeon    informed consent obtained

## 2015-01-23 NOTE — UM Notes (Signed)
Guideline: General Criteria: Observation Care  Care Date 01/22/15     Interval History/24h events: No acute events overnight. No fresh blood in stools    VS: T97.8, P74, O2 100%, R18, BP 127/68    Labs: H/H 8.2/25.6    Plan of Care/MEDS:  On liquid diet in preparation for EGD and colonoscopy.  Refused to take Golytely and requested Mag Citrate instead    Dispo: Anticipated medical stability for discharge: September, 19 - Afternoon  Service status: Inpatient: risk of morbidity and mortality  Reason for ongoing hospitalization:waiting for EGD and Colonoscopy  Anticipated discharge needs:none    Jiles Prows, RN, BSN, MHA, [facility]  Utilization Review Nurse  [phone] (voicemail)  [phone] (phone)

## 2015-01-23 NOTE — Plan of Care (Signed)
Problem: Safety  Goal: Patient will be free from injury during hospitalization  Outcome: Progressing  Low fall risk precautions in place. Currently bowel prepping via mag citrate for colonoscopy today. Refused  States BM is liquid, however, it is still dark red- old blood per patient. Q12hr H&H to be done, the next one to be drawn at 5 AM.

## 2015-01-23 NOTE — H&P (Signed)
GI PRE PROCEDURE NOTE    Proceduralist Comments:   Review of Systems and Past Medical / Surgical History performed: Yes     Indications:Gastrointestinal bleeding and Gastrointestinal bleeding    Previous Adverse Reaction to Anesthesia or Sedation (if yes, describe): No    Physical Exam / Laboratory Data (If applicable)   Airway Classification: Class II    General: Alert and cooperative  Lungs: Lungs clear to auscultation  Cardiac: RRR, normal S1S2.    Abdomen: Soft, non tender. Normal active bowel sounds  Other:     Recent BMP No results for input(s): GLU, BUN, CREAT, CA, NA, K, CL, CO2 in the last 24 hours.    Invalid input(s): AGAP    American Society of Anesthesiologists (ASA) Physical Status Classification:   ASA 3 - Patient with moderate systemic disease with functional limitations    Planned Sedation:   Deep sedation with anesthesia    Attestation:   Katherine Freeman has been reassessed immediately prior to the procedure and is an appropriate candidate for the planned sedation and procedure. Risks, benefits and alternatives to the planned procedure and sedation have been explained to the patient or guardian:  yes        Signed by: Lestine Mount

## 2015-01-23 NOTE — Transfer of Care (Signed)
Procedure: Endoscopy + Colonoscopy  IV General Anesthesia  PACU Phase 2  See: PACU Vital Signs  Pain well controlled  Hemodynamically Stable  Appropriate Mental Status  Tolerating Room Air  N/V controlled  No obvious anesthetic complications

## 2015-01-23 NOTE — Plan of Care (Signed)
Problem: Safety  Goal: Patient will be free from injury during hospitalization  Outcome: Progressing  Pt. Denies dizziness. Ambulating to bathroom without assist. Will continue to monitor.     Comments:   Pt. A/O x4.    Denies pain.  Reports completing prep overnight with PO medication and running clear stool.  NPO since midnight. Will continue to monitor.

## 2015-01-24 ENCOUNTER — Telehealth (INDEPENDENT_AMBULATORY_CARE_PROVIDER_SITE_OTHER): Payer: Self-pay

## 2015-01-24 LAB — LAB USE ONLY - HISTORICAL SURGICAL PATHOLOGY

## 2015-01-24 NOTE — Telephone Encounter (Signed)
Post Acute Discharge Call    Type of Encounter (ER, InPt or Obs) :  Obs     Facility: [facility]     Discharge Date: 01/23/15    Primary Discharge Dx: Lower GI Bleed    Prior ER Visits/Hospitalizations (past yr): 0    TCM Eligible: N    Follow Up Appt with PCP/Specialist : Patient stated that she will be following up with Dr Cyndra Numbers - GI and will be calling PCP office to establish care with new doctor.     Outside care ordered: NO    Patient Questions:    What symptoms made you go to the ER or hospital?: Patient went in with GI bleed     How do you feel today after your recent hospital/ER visit?: Patient states doing very well. Symptoms have resolved.     Do you have any new or worsening symptoms since being seen?    No  If so, describe your symptoms and what you have tried to relieve your symptoms:      Do you have your discharge instructions? Yes   Any questions about them? 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 Stated she understands her medications and is ok.     Do you have questions about any new medications or changes to your medications from the visit? No     Do you have assistance at home and transportation to appointments? Patient stated she drives her self but of needing assistance with transportation has someone she can call.       Advised to follow up as instructed.  Patient / family member verbalizes understanding and has no other questions or concerns.   Instructed to call back if symptoms change or worsen and/or go to the emergency room or call 911 for emergency symptoms.

## 2015-01-24 NOTE — Anesthesia Postprocedure Evaluation (Signed)
Anesthesia Post Evaluation    Patient: Katherine Freeman    Procedure(s):  EGD, COLONOSCOPY    Anesthesia type: general    Last Vitals:   Filed Vitals:    01/23/15 1320   BP: 129/82   Pulse: 77   Temp:    Resp:    SpO2: 98%       Patient Location: Phase I PACU      Post Pain: Patient not complaining of pain, continue current therapy    Mental Status: awake and alert    Respiratory Function: tolerating nasal cannula    Cardiovascular: stable    Nausea/Vomiting: patient not complaining of nausea or vomiting    Hydration Status: adequate    Post Assessment: no apparent anesthetic complications, no reportable events and no evidence of recall          Anesthesia Qualified Clinical Data Registry    Central Line      CVC insertion : NO                                               Perioperative temperature management      General/neuraxial anesthesia > or = 60 minutes (excluding CABG) : NO                                Administration of antibiotic prophylaxis      Age > or = 18, with IV access, with surgical procedure for which antibiotic prophylaxis indicated, and not on chronic antibiotics : NO                  Medication Administration      Ordering or administration of drug inconsistent with intended drug, dose, delivery or timing : NO      Dental loss/damage      Dental injury with administration of anesthesia : NO      Difficult intubation due to unrecognized difficult airway        Elective airway procedure including but not limited to: tracheostomy, fiberoptic bronchoscopy, rigid bronchoscopy; jet ventilation; or elective use of a device to facilitate airway management such as a Glidescope : NO                > Unanticipated difficult intubation post pre-evaluation : NO      Aspiration of gastric contents        Aspiration of gastric contents : NO                    Surgical fire        Procedure requiring electrocautery/laser : NO                    Immediate perioperative cardiac arrest        Cardiac arrest in  OR or PACU : NO                    Unplanned hospital admission        Unplanned hospital admission for initially intended outpatient anesthesia service : NO      Unplanned ICU admission        Unplanned ICU admission related to anesthesia occurring within 24 hours of induction or start of MAC : NO      Surgical case cancellation  Cancellation of procedure after care already started by anesthesia care team : NO      Post-anesthesia transfer of care checklist/protocol to PACU        Transfer from OR to PACU upon case conclusion : YES              > Use of PACU transfer checklist/protocol : YES     (Includes the key elements of: patient identification, responsible practitioner identification (PACU nurse or advanced practitioner), discussion of pertinent history and procedure course, intraoperative anesthetic management, post-procedure plans, acknowledgement/questions)    Post-anesthesia transfer of care checklist/protocol to ICU        Transfer from OR to ICU upon case conclusion : NO                    Post-operative nausea/vomiting risk protocol        Post-operative nausea/vomiting risk protocol : NO  Patient > or = 18 with care initiated by anesthesia team that has a risk factor screen for post-op nausea/vomiting (Includes female, hx PONV, or motion sickness, non-smoker, intended opioid administration for post-op analgesia.)    Anaphylaxis        Anaphylaxis during anesthesia services : NO    (Inclusive of any suspected transfusion reaction in association with blood-bank confirmed blood product incompatibility)              Gery Pray, 01/24/2015 8:52 AM

## 2015-01-25 ENCOUNTER — Encounter: Payer: Self-pay | Admitting: Gastroenterology

## 2015-01-28 NOTE — Discharge Summary (Signed)
Discharge Summary    Date:01/28/2015   Patient Name: [facility]  Attending Physician: No att. providers found    Date of Admission:   01/19/2015    Date of Discharge:   01/24/2015    Admitting Diagnosis:   GI bleeding    Discharge Dx:     Principal Diagnosis (Diagnosis after study, that is chiefly responsible for admission to inpatient status): Lower GI bleed      Treatment Team:   Treatment Team:   Consulting Physician: Jacqulyn Cane, MD     Procedures performed:   Radiology: all results from this admission  Nm Gi Blood Loss    01/20/2015  . Negative for acute gastrointestinal hemorrhage. 2. Recognize activity in the stomach is probably related to a tracer labeling issue. This could be confirmed with NG tube placement. Marty Heck, MD 01/20/2015 8:34 AM     Surgery: all results from this admission  Procedure(s):  EGD, COLONOSCOPY (N/A)    Reason for Admission:   Hematochezia  Hospital Course:     64 yo female who presented with hematochezia. Ha history of colonic diverticulosis diagnosed by colonoscopy in 2014. Bleeding scan was negative for active bleeder.Stools turned black/tarry and H&H remained stable.Patient has also been taking Mobic for pain, EGD showed a polyp in the distal duodenal bulb measuring 1x2 cm which was biopsied. Colonoscopy showed severe colonic diverticulosis. There was no evidence of active bleeding on EGD or colonoscopy. The patient was instructed to eat a high fiber diet, and to avoid NSAIDS.    Condition at Discharge:   Stable  Today:     BP 129/82 mmHg  Pulse 77  Temp(Src) 96.5 F (35.8 C) (Oral)  Resp 16  Ht 1.6 m (5\' 3" )  Wt 86.909 kg (191 lb 9.6 oz)  BMI 33.95 kg/m2  SpO2 98%  Ranges for the last 24 hours:       Last set of labs     Recent Labs  Lab 01/23/15  0502   HGB 7.7*   HEMATOCRIT 23.8*       Micro / Labs / Path pending:     Unresulted Labs     Procedure . . . Date/Time    Hemoglobin and hematocrit, blood (Q6H) [[id number]] Collected:  01/20/15 2008     Specimen Information:  Blood Updated:  01/20/15 2008              Discharge Instructions:     Follow-up Information     Follow up with abedi In 1 week.        Follow up with Lestine Mount, MD In 2 weeks.    Specialty:  Gastroenterology    Contact information:    [address]  [place] [address]  [phone]          Follow up with Olen Pel, MD .    Specialty:  Internal Medicine          Discharge Diet: High fiber          Disposition:  Home or Self Care     Discharge Medication List      Taking          albuterol 108 (90 BASE) MCG/ACT inhaler   Dose:  2 puff   Commonly known as:  PROAIR HFA   Inhale 2 puffs into the lungs every 4 (four) hours as needed for bronchiospasm and cough.       glucosamine-chondroitin 500-400 MG tablet  Dose:  1 tablet   Take 1 tablet by mouth 3 (three) times daily.       hydrochlorothiazide 12.5 MG tablet   Dose:  12.5 mg   Commonly known as:  HYDRODIURIL   Take 1 tablet (12.5 mg total) by mouth nightly.       meloxicam 15 MG tablet   Commonly known as:  MOBIC   Take 1 tablet by mouth every evening       psyllium 58.6 % powder   Dose:  1 tablespoon   Commonly known as:  METAMUCIL MULTIHEALTH FIBER   Take 1 tablespoon by mouth 3 (three) times daily.       rosuvastatin 20 MG tablet   Dose:  10 mg   Commonly known as:  CRESTOR   Take 0.5 tablets (10 mg total) by mouth daily.       sertraline 100 MG tablet   Dose:  100 mg   Commonly known as:  ZOLOFT   Take 1 tablet (100 mg total) by mouth nightly.       valACYclovir HCL 500 MG tablet   Dose:  500 mg   Commonly known as:  VALTREX   Take 1 tablet (500 mg total) by mouth daily.           Minutes spent coordinating discharge and reviewing discharge plan: 25 minutes      Signed by: Vernell Barrier, MD      CC:   Letitia Libra, MD , PCP

## 2015-02-06 ENCOUNTER — Encounter (INDEPENDENT_AMBULATORY_CARE_PROVIDER_SITE_OTHER): Payer: Self-pay | Admitting: Internal Medicine

## 2015-02-06 ENCOUNTER — Ambulatory Visit (INDEPENDENT_AMBULATORY_CARE_PROVIDER_SITE_OTHER): Payer: No Typology Code available for payment source | Admitting: Internal Medicine

## 2015-02-06 VITALS — BP 117/71 | HR 81 | Resp 16 | Wt 189.0 lb

## 2015-02-06 DIAGNOSIS — M170 Bilateral primary osteoarthritis of knee: Secondary | ICD-10-CM

## 2015-02-06 DIAGNOSIS — Z23 Encounter for immunization: Secondary | ICD-10-CM

## 2015-02-06 DIAGNOSIS — E7849 Other hyperlipidemia: Secondary | ICD-10-CM

## 2015-02-06 DIAGNOSIS — A6000 Herpesviral infection of urogenital system, unspecified: Secondary | ICD-10-CM

## 2015-02-06 DIAGNOSIS — E784 Other hyperlipidemia: Secondary | ICD-10-CM

## 2015-02-06 DIAGNOSIS — I10 Essential (primary) hypertension: Secondary | ICD-10-CM

## 2015-02-06 DIAGNOSIS — M1710 Unilateral primary osteoarthritis, unspecified knee: Secondary | ICD-10-CM | POA: Insufficient documentation

## 2015-02-06 DIAGNOSIS — F329 Major depressive disorder, single episode, unspecified: Secondary | ICD-10-CM

## 2015-02-06 DIAGNOSIS — K922 Gastrointestinal hemorrhage, unspecified: Secondary | ICD-10-CM

## 2015-02-06 DIAGNOSIS — Z1239 Encounter for other screening for malignant neoplasm of breast: Secondary | ICD-10-CM

## 2015-02-06 DIAGNOSIS — Z7189 Other specified counseling: Secondary | ICD-10-CM

## 2015-02-06 DIAGNOSIS — Z7689 Persons encountering health services in other specified circumstances: Secondary | ICD-10-CM

## 2015-02-06 DIAGNOSIS — D62 Acute posthemorrhagic anemia: Secondary | ICD-10-CM

## 2015-02-06 LAB — CBC AND DIFFERENTIAL
Basophils Absolute Automated: 0.04 10*3/uL (ref 0.00–0.20)
Basophils Automated: 0 %
Eosinophils Absolute Automated: 0.17 10*3/uL (ref 0.00–0.70)
Eosinophils Automated: 2 %
Hematocrit: 31 % — ABNORMAL LOW (ref 37.0–47.0)
Hgb: 9.6 g/dL — ABNORMAL LOW (ref 12.0–16.0)
Immature Granulocytes Absolute: 0.01 10*3/uL
Immature Granulocytes: 0 %
Lymphocytes Absolute Automated: 2.69 10*3/uL (ref 0.50–4.40)
Lymphocytes Automated: 34 %
MCH: 30.1 pg (ref 28.0–32.0)
MCHC: 31 g/dL — ABNORMAL LOW (ref 32.0–36.0)
MCV: 97.2 fL (ref 80.0–100.0)
MPV: 10.6 fL (ref 9.4–12.3)
Monocytes Absolute Automated: 0.56 10*3/uL (ref 0.00–1.20)
Monocytes: 7 %
Neutrophils Absolute: 4.47 10*3/uL (ref 1.80–8.10)
Neutrophils: 56 %
Nucleated RBC: 0 /100 WBC (ref 0–1)
Platelets: 370 10*3/uL (ref 140–400)
RBC: 3.19 10*6/uL — ABNORMAL LOW (ref 4.20–5.40)
RDW: 15 % (ref 12–15)
WBC: 7.94 10*3/uL (ref 3.50–10.80)

## 2015-02-06 LAB — HEMOLYSIS INDEX: Hemolysis Index: 3 (ref 0–18)

## 2015-02-06 LAB — FERRITIN: Ferritin: 18.48 ng/mL (ref 4.60–204.00)

## 2015-02-06 MED ORDER — ROSUVASTATIN CALCIUM 10 MG PO TABS
10.0000 mg | ORAL_TABLET | Freq: Every day | ORAL | Status: DC
Start: 2015-02-06 — End: 2015-12-25

## 2015-02-06 NOTE — Progress Notes (Signed)
Subjective:      Patient ID: Katherine Freeman  is a 64 y.o.  female.     Chief Complaint   Patient presents with   . Establish Care   . GI Bleeding     hosp x 3 days      HPI  Katherine Freeman is here today to establish care. She was self referred by . She last saw a primary care physician in June. Her doctor left the practice.    Problem   Establishing Care With New Doctor, Encounter for   Primary Osteoarthritis of Knee    Has stopped all nsaids following GIB in September. Is meeting with Dr Chipper Herb on October 21. Tylenol takes 650 mg daily and it doesn't really do that much. She is interested in trying tramadol if possible.     Lower GI Bleed    01/2015. 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 She never felt good when she took the medication. Symptoms developed rather quickly. She started bleeding at 5 pm and initially ebbed and flowed, then at 8 pm she had more bleedin gand went to ER. She was admitted for 4 days. She has not had any further bleeding. She had egd/colonoscopy on Monday Sept 19 which showed only diverticulosis and a couple of small polyps which were removed. It is presumed that one of the diverticula bled. She has fh cancer. Brother died of stomach cancer at age 67, sister died at 28 of lung cancer, niece died of ca of small intestine in her mid 32s.  Lowest hgb was 7.7 on 9/19. Ferritin 48.      Acute Blood Loss Anemia    01/23/2015 hgb 7.7, ferritin 48. Pt has not been taking iron. She has been quite tired and also went back to work and the first day caught a virus. Check cbc, ferritin today.     Hyperlipidemia    Tried simvastatin and atorvastatin without good result in the past. Failed to bring down her LDL level below 165. Has done quite well on Crestor. Willing to try generic now that there is one. Reports no side effects on Crestor.  TC 164 in 10/2014     Hypertensive Disorder    Lost 20 lbs and was able to decrease hctz dose to 12.5 mg. bp is excellent today. She denies cp, sob, ha,  dizziness.     Herpes, Genital    Contracted in 2012. She requires the prophylactic medication or she will get a flareup. She would like to continue it for now and try to stop again next year.     Depression    Started around the time of menopause when several deaths occurred in the family. Feels much better since starting Zoloft 100 mg a few years ago. She feels it really helps her. Denies HI/SI.       Past Medical History   Diagnosis Date   . Depression    . Hypertensive disorder    . Hyperlipidemia    . Gastroesophageal reflux disease    . Mitral valve prolapse    . Anxiety    . Arthritis    . STD (sexually transmitted disease)      Past Surgical History   Procedure Laterality Date   . Egd, colonoscopy  01/22/2013     Procedure: EGD, COLONOSCOPY;  Surgeon: Luberta Mutter, MD;  Location: [facility];  Service: Gastroenterology;  Laterality: N/A;  EGD, COLONOSCOPY W/IVA   . Arthroscopy knee, diagnostic  right   . Egd, colonoscopy N/A 01/23/2015     Procedure: EGD, COLONOSCOPY;  Surgeon: Lestine Mount, MD;  Location: [facility];  Service: Gastroenterology;  Laterality: N/A;   . Breast biopsy Left 2010     abnormal mammogram, benign     OB History     No data available        Family History   Problem Relation Age of Onset   . Heart disease Mother    . Stroke Mother    . Hyperlipidemia Mother    . Heart attack Mother 59     chf, passed away   . Hypertension Mother    . Diabetes Mother    . Cancer Sister 36     lung cancer, smoker   . Cancer Brother 53     cancer of the stomach   . Alcohol abuse Father    . Cancer Other 32     ca of small intestine     Social History     Social History   . Marital Status: Divorced     Spouse Name: N/A   . Number of Children: 0   . Years of Education: N/A     Occupational History   . RN      [employer]     Social History Main Topics   . Smoking status: Former Smoker -- 0.25 packs/day for 20 years     Quit date: 01/21/1993   . Smokeless tobacco: Never Used      Comment: off  and on, no more than 4-5 cigarettes/day   . Alcohol Use: Yes      Comment: 1-2 glasses wine 1-2 times/year   . Drug Use: No   . Sexual Activity:     Partners: Male     Birth Control/ Protection: Post-menopausal     Other Topics Concern   . Not on file     Social History Narrative     Current Outpatient Prescriptions   Medication Sig Dispense Refill   . hydrochlorothiazide (HYDRODIURIL) 12.5 MG tablet Take 1 tablet (12.5 mg total) by mouth nightly. 90 tablet 3   . rosuvastatin (CRESTOR) 10 MG tablet Take 1 tablet (10 mg total) by mouth daily. 90 tablet 3   . sertraline (ZOLOFT) 100 MG tablet Take 1 tablet (100 mg total) by mouth nightly. 90 tablet 3   . valACYclovir HCL (VALTREX) 500 MG tablet Take 1 tablet (500 mg total) by mouth daily. 90 tablet 3   . [DISCONTINUED] rosuvastatin (CRESTOR) 10 MG tablet Take 10 mg by mouth daily.     . [DISCONTINUED] rosuvastatin (CRESTOR) 20 MG tablet Take 0.5 tablets (10 mg total) by mouth daily. 45 tablet 3     No current facility-administered medications for this visit.     No Known Allergies      Review of Systems   Constitutional: Negative for fever, chills, activity change, appetite change, fatigue and unexpected weight change.   HENT: Negative for congestion, ear pain, facial swelling, mouth sores, nosebleeds, postnasal drip, rhinorrhea and sinus pressure.    Eyes: Negative for photophobia, redness and itching.   Respiratory: Negative for cough, choking, chest tightness, shortness of breath and wheezing.    Cardiovascular: Negative for chest pain, palpitations and leg swelling.   Gastrointestinal: Negative for nausea, vomiting, abdominal pain, diarrhea, constipation, blood in stool and abdominal distention.   Endocrine: Negative for cold intolerance, heat intolerance, polydipsia, polyphagia and polyuria.   Genitourinary: Negative for dysuria, urgency, frequency,  flank pain, vaginal discharge, difficulty urinating and pelvic pain.   Musculoskeletal: Negative for myalgias, back  pain, joint swelling, arthralgias, gait problem and neck stiffness.   Skin: Negative for color change, pallor and rash.   Neurological: Negative for dizziness, tremors, seizures, weakness, light-headedness and headaches.   Hematological: Negative for adenopathy. Does not bruise/bleed easily.   Psychiatric/Behavioral: Negative for confusion, sleep disturbance, decreased concentration and agitation. The patient is not nervous/anxious.          BP 117/71 mmHg  Pulse 81  Resp 16  Wt 85.73 kg (189 lb)  SpO2 98%    Objective:   Physical Exam   Constitutional: She is oriented to person, place, and time. She appears well-developed and well-nourished. No distress.   HENT:   Head: Normocephalic and atraumatic.   Right Ear: External ear normal.   Left Ear: External ear normal.   Nose: Nose normal.   Mouth/Throat: Oropharynx is clear and moist. No oropharyngeal exudate.   Eyes: Conjunctivae and EOM are normal. Pupils are equal, round, and reactive to light. No scleral icterus.   Neck: Normal range of motion. Neck supple. No JVD present. No thyromegaly present.   Cardiovascular: Normal rate and regular rhythm.  Exam reveals no gallop and no friction rub.    No murmur heard.  Pulmonary/Chest: Effort normal and breath sounds normal. No respiratory distress. She has no wheezes.   Abdominal: Soft. Bowel sounds are normal. There is no tenderness. There is no rebound and no guarding.   Musculoskeletal: Normal range of motion. She exhibits no edema.   Lymphadenopathy:     She has no cervical adenopathy.   Neurological: She is alert and oriented to person, place, and time. She has normal reflexes.   Skin: Skin is warm and dry. No rash noted.   Psychiatric: She has a normal mood and affect. Her behavior is normal. Judgment and thought content normal.        Assessment:     1. Lower GI bleed    2. Establishing care with new doctor, encounter for    3. Other hyperlipidemia    4. Essential hypertension    5. Acute blood loss anemia     6. Herpes simplex infection of genitourinary system    7. Reactive depression    8. Primary osteoarthritis of both knees    9. Need for prophylactic vaccination and inoculation against influenza    10. Breast cancer screening         Plan:     1. Lower GI bleed  Hospitalized in 01/2015 x 3 days, marked diverticulosis coupled with NSAID use observed and was suspected cause of the bleed. No additional bleeding since discharge. Pt has stopped all NSAID use.  2. Establishing care with new doctor, encounter for  Full history and focused physical exam performed today.    3. Other hyperlipidemia  Doing well with current medication Crestor 10 mg. Requests generic substitution for cost. New script given.  Labs reviewed from 10/18/2014 physical and tchol 164, LDL 83.  . - rosuvastatin (CRESTOR) 10 MG tablet; Take 1 tablet (10 mg total) by mouth daily.  Dispense: 90 tablet; Refill: 3    4. Essential hypertension  At goal after weight loss on lower dose of hctz. Doing well. I counseled her on her BP levels and counseled her that a normal BP is 120/80 and that high blood pressure is considered when there is persistent elevation of BP > 140/90. I outlined the risks of  chronically elevated BP in the long term to include higher risk of CVA and MI. I discussed treatment options to include salt avoidance, weight loss, regular aerobic exercise, avoidance of heavy use of alcohol, tobacco cessation and medication treatment.    5. Acute blood loss anemia  Lowest hgb was 7.7. Pt has had no further bleeding for 2 weeks. Will repeat cbc. No iron deficiency at the time of admission.    - CBC and differential  - Ferritin    6. Herpes simplex infection of genitourinary system  Requires suppressive valacyclovir 500 mg daily, does well when taking it.    7. Reactive depression  Continue sertraline 100 mg daily. Denies HI/SI.   8. Primary osteoarthritis of both knees  Avoid nsaids due to GI history. Exercise, physical therapy if indicated,  quadriceps strengthening,     9. Need for prophylactic vaccination and inoculation against influenza  Flu shot declined today. Pt states she will get it in a couple of weeks when she is fully recovered.    10. Breast cancer screening  - Mammo Digital Screening Bilateral W Cad; Future      Risks and benefits of medications were reviewed with the patient and she expressed understanding. All questions were answered. Call with updates/questions/concerns. Follow up in 9 months for annual.    Marjean Donna C. Shelva Majestic, MD, [facility]

## 2015-02-06 NOTE — Progress Notes (Signed)
Will return for FLU vaccine

## 2015-02-06 NOTE — Patient Instructions (Signed)
If you have not done so already, please sign up for Mychart (instructions should be on the bottom of your visit summary).    See me once yearly for annual preventative visit. This is different and separate from any problem-related visit.     Get a flu vaccine yearly in the fall.  Increase vegetables, fruits, and fiber in your diet.  Exercise at least 30 minutes 5 days per week.  If you are premenopausal: goal calcium intake 1000mg  daily (diet +/- supplement) and goal vitamin D intake 1000 units daily (diet +/- supplement)  If you are post-menopausal goals for calcium are 1200 mg daily (diet +/- supplement) and 800 units of Vitamin D daily     Always wear your seatbelt in the car.  Wear sunscreen that is broad-spectrum (UVA/UVB) and at least SPF 30.  Avoid heavy drinking (>7 drinks per week).  Goal Blood Pressure <140/90  Schedule dental exam and cleaning every 6 months  Have your vision checked every 1-2 years.  Do you have an advanced directive? If so please provide our office a copy. I recommend that all adults regardless of age have an advanced directive.     One of the best websites for medical information is called UpToDate. It's free for patients. PhotoSolver.pl. [facility] website also has good information. TanExchange.nl     It was nice to meet you today. Thank you for choosing [facility] for your health care needs.  Labs today.  Mammogram ordered.    Carlton Adam, MD, [facility]  [address]  [place], [place] [address]  [phone]

## 2015-02-13 ENCOUNTER — Other Ambulatory Visit (INDEPENDENT_AMBULATORY_CARE_PROVIDER_SITE_OTHER): Payer: Self-pay | Admitting: Internal Medicine

## 2015-02-13 DIAGNOSIS — D62 Acute posthemorrhagic anemia: Secondary | ICD-10-CM

## 2015-02-13 MED ORDER — FERROUS SULFATE DRIED ER 160 (50 FE) MG PO TBCR
1.0000 | EXTENDED_RELEASE_TABLET | Freq: Every day | ORAL | Status: AC
Start: 2015-02-13 — End: 2015-04-14

## 2015-02-24 ENCOUNTER — Encounter (INDEPENDENT_AMBULATORY_CARE_PROVIDER_SITE_OTHER): Payer: Self-pay

## 2015-02-24 ENCOUNTER — Ambulatory Visit (INDEPENDENT_AMBULATORY_CARE_PROVIDER_SITE_OTHER): Payer: No Typology Code available for payment source | Admitting: Internal Medicine

## 2015-02-24 ENCOUNTER — Other Ambulatory Visit: Payer: Self-pay

## 2015-02-24 ENCOUNTER — Encounter (INDEPENDENT_AMBULATORY_CARE_PROVIDER_SITE_OTHER): Payer: Self-pay | Admitting: Internal Medicine

## 2015-02-24 VITALS — Temp 96.1°F | Ht 62.5 in | Wt 187.0 lb

## 2015-02-24 DIAGNOSIS — M2550 Pain in unspecified joint: Secondary | ICD-10-CM | POA: Insufficient documentation

## 2015-02-24 DIAGNOSIS — M170 Bilateral primary osteoarthritis of knee: Secondary | ICD-10-CM

## 2015-02-24 DIAGNOSIS — M25569 Pain in unspecified knee: Secondary | ICD-10-CM

## 2015-02-24 DIAGNOSIS — G8929 Other chronic pain: Secondary | ICD-10-CM | POA: Insufficient documentation

## 2015-02-24 DIAGNOSIS — M5489 Other dorsalgia: Secondary | ICD-10-CM | POA: Insufficient documentation

## 2015-02-24 MED ORDER — TRAMADOL HCL 50 MG PO TABS
50.0000 mg | ORAL_TABLET | Freq: Two times a day (BID) | ORAL | 2 refills | Status: DC | PRN
Start: 2015-02-24 — End: 2015-05-26
  Filled 2015-02-24: qty 60, 30d supply, fill #0
  Filled 2015-05-06: qty 60, 30d supply, fill #1

## 2015-02-24 NOTE — Progress Notes (Signed)
Has the patient sought any care outside of the [facility]? YES    -Refer to care team-   Or  List Specialists:    Yes, listed under Care Team.

## 2015-02-24 NOTE — Progress Notes (Signed)
Initial Rheumatology Consultation    Chief Complaint:     Chief Complaint   Patient presents with   . Initial Consult     Joint pain in knees and back          HPI:   This patient is a 64 y.o. year old female OA of knees, lumbar spondylosis, colonic diverticulosis referred by Lorella Nimrod, MD to evaluate joint pain. She has chronic pain of knees for over 5 yrs (R>L). Her pain is worse with walking. She saw rheumatology Dr. Albin Felling. She was diagnosed OA and had steroid injection of Rt in 8/16. She was given mobic and had lower GI bleeding. She went to ER and was found hematochezia. She takes tylenol as needed.     XR of Rt knee in 7/16 showed mild OA. XR of L-spine in 7/16 showed lumbar spondylosis, multilevel degenerative disc and facet disease, from L3-4 through L5-S1. She has back pain for a month with radiation to her left leg.     She had history of colonic diverticulosis diagnosed by colonoscopy in 2014. Bleeding scan was negative for active bleeder. EGD showed a polyp in the distal duodenal bulb measuring 1x2 cm which was biopsied. Colonoscopy showed severe colonic diverticulosis.    Denies any fevers, chills or night sweats.  Denies fatigue or weight loss. Denies rash, malar rash or photosensitivity, discoid lesions, psoriasis, nail changes or Raynaud's. Denies hair loss or alopecia. Denies Sicca symptoms, nasal or oral ulcers or epistaxis. Denies cough, shortness of breath, chest pain, palpation or exercise intolerance. Denies nausea, vomiting, diarrhea, melena, hematochezia or dysphagia.  Denies dysuria and hematuria. Denies chronic headaches, paresthesias, focal weakness or seizure activity.      Denies any family history of lupus, rheumatoid arthritis, connective tissue disease, psoriasis, inflammatory bowel disease, ankylosing spondylitis, multiple sclerosis or other autoimmune diseases.           PMSH:     Past Medical History   Diagnosis Date   . Depression    . Hypertensive disorder    .  Hyperlipidemia    . Gastroesophageal reflux disease    . Mitral valve prolapse    . Anxiety    . Arthritis    . STD (sexually transmitted disease)        Past Surgical History   Procedure Laterality Date   . Egd, colonoscopy  01/22/2013     Procedure: EGD, COLONOSCOPY;  Surgeon: Luberta Mutter, MD;  Location: [facility];  Service: Gastroenterology;  Laterality: N/A;  EGD, COLONOSCOPY W/IVA   . Arthroscopy knee, diagnostic       right   . Egd, colonoscopy N/A 01/23/2015     Procedure: EGD, COLONOSCOPY;  Surgeon: Lestine Mount, MD;  Location: [id number];  Service: Gastroenterology;  Laterality: N/A;   . Breast biopsy Left 2010     abnormal mammogram, benign       Allergies:   No Known Allergies    Meds:     Current outpatient prescriptions:   .  Ferrous Sulfate Dried 160 (50 FE) MG Tab CR, Take 1 each by mouth daily., Disp: , Rfl:   .  hydrochlorothiazide (HYDRODIURIL) 12.5 MG tablet, Take 1 tablet (12.5 mg total) by mouth nightly., Disp: 90 tablet, Rfl: 3  .  rosuvastatin (CRESTOR) 10 MG tablet, Take 1 tablet (10 mg total) by mouth daily., Disp: 90 tablet, Rfl: 3  .  sertraline (ZOLOFT) 100 MG tablet, Take 1 tablet (100 mg total) by mouth  nightly., Disp: 90 tablet, Rfl: 3  .  valACYclovir HCL (VALTREX) 500 MG tablet, Take 1 tablet (500 mg total) by mouth daily., Disp: 90 tablet, Rfl: 3    FH:     Family History   Problem Relation Age of Onset   . Heart disease Mother    . Stroke Mother    . Hyperlipidemia Mother    . Heart attack Mother 53     chf, passed away   . Hypertension Mother    . Diabetes Mother    . Cancer Sister 21     lung cancer, smoker   . Cancer Brother 80     cancer of the stomach   . Alcohol abuse Father    . Cancer Other 32     ca of small intestine       SH:     Social History     Social History   . Marital Status: Divorced     Spouse Name: N/A   . Number of Children: 0   . Years of Education: N/A     Occupational History   . RN      [employer]     Social History Main Topics   . Smoking  status: Former Smoker -- 0.25 packs/day for 20 years     Quit date: 01/21/1993   . Smokeless tobacco: Never Used      Comment: off and on, no more than 4-5 cigarettes/day   . Alcohol Use: Yes      Comment: 1-2 glasses wine 1-2 times/year   . Drug Use: No   . Sexual Activity:     Partners: Male     Birth Control/ Protection: Post-menopausal     Other Topics Concern   . Not on file     Social History Narrative       ROS:   All other systems reviewed and negative except as described above as HPI.       PHYSICAL EXAM:     Filed Vitals:    02/24/15 0942   Temp: 96.1 F (35.6 C)       General appearance - alert, well appearing, and in no distress  Mental status - alert, oriented to person, place, and time  Eyes - pupils equal and reactive, extraocular eye movements intact  Ears - bilateral TM's and external ear canals normal  Nose - normal and patent, no erythema, discharge or polyps  Mouth - mucous membranes moist, pharynx normal without lesions  Neck - supple, no significant adenopathy  Lymphatics - no palpable lymphadenopathy, no hepatosplenomegaly  Chest - clear to auscultation, no wheezes, rales or rhonchi, symmetric air entry  Heart - normal rate, regular rhythm, normal S1, S2, no murmurs, rubs, clicks or gallops  Abdomen - soft, nontender, nondistended, no masses or organomegaly  Back exam - full range of motion, no tenderness, palpable spasm or pain on motion  Neurological - alert, oriented, normal speech, no focal findings or movement disorder noted  Musculoskeletal - Good range of cervical spine.  No crepitus on TMJ auscultation.  No tenderness of MCPs and PIPs. Good range of motion of shoulders, elbows, wrists and small joints of hands. Good range of motion of hips, knees and ankles.  Knees crepitus with range of motion of knees without effusions. Ankles without effusions.  No tenderness of MTPs or effusions.    Extremities - peripheral pulses normal, no pedal edema, no clubbing or cyanosis  Skin - normal  coloration and  turgor, no rashes, no suspicious skin lesions noted        Labs:         Radiology:         ASSESSMENT:   Katherine Freeman is a 64 y.o. female OA of knees, lumbar spondylosis, colonic diverticulosis. She has OA of knees.       PLAN:   1. Start tramadol 50 mg as needed   2. Suggest knee braces and weight loss, movefree    3. Refer to PT  4. Will consider Synvisc injection  5. Follow 3 months   6. Order XR of both knees

## 2015-02-24 NOTE — Patient Instructions (Signed)
1. Start tramadol 50 mg as needed   2. Suggest knee braces and weight loss, movefree    3. Refer to PT  4. Will consider Synvisc injection  5. Follow 3 months   6. Order XR of both knees

## 2015-02-27 ENCOUNTER — Ambulatory Visit
Admission: RE | Admit: 2015-02-27 | Discharge: 2015-02-27 | Disposition: A | Discharge status: Home / Self Care | Payer: No Typology Code available for payment source | Source: Ambulatory Visit | Attending: Internal Medicine | Admitting: Internal Medicine

## 2015-02-27 DIAGNOSIS — G8929 Other chronic pain: Secondary | ICD-10-CM

## 2015-02-27 DIAGNOSIS — M25569 Pain in unspecified knee: Secondary | ICD-10-CM

## 2015-03-10 ENCOUNTER — Other Ambulatory Visit (INDEPENDENT_AMBULATORY_CARE_PROVIDER_SITE_OTHER): Payer: Self-pay

## 2015-03-10 DIAGNOSIS — Z1239 Encounter for other screening for malignant neoplasm of breast: Secondary | ICD-10-CM

## 2015-03-15 ENCOUNTER — Inpatient Hospital Stay
Payer: No Typology Code available for payment source | Attending: Internal Medicine | Admitting: Rehabilitative and Restorative Service Providers"

## 2015-03-15 DIAGNOSIS — M25562 Pain in left knee: Secondary | ICD-10-CM | POA: Insufficient documentation

## 2015-03-15 DIAGNOSIS — M25561 Pain in right knee: Secondary | ICD-10-CM | POA: Insufficient documentation

## 2015-03-15 DIAGNOSIS — M545 Low back pain: Secondary | ICD-10-CM | POA: Insufficient documentation

## 2015-03-15 DIAGNOSIS — G8929 Other chronic pain: Secondary | ICD-10-CM

## 2015-03-15 NOTE — PT/OT Therapy Note (Signed)
INITIAL EVALUATION (Knee)      Name: Katherine Freeman Age: 64 y.o. Occupation: [employer], nurse  SOC: 03/15/2015  Referring Physician: Sharen Hint, MD MD recheck: unsure  DOS: N/A DOI: Onset of Problem / Injury: 02/24/15  Diagnosis (Treating/Medical): The primary encounter diagnosis was Chronic pain of both knees. A diagnosis of Chronic bilateral low back pain without sciatica was also pertinent to this visit.   # of Authorized Visits: 16 Visit # 1 today     SUBJECTIVE: States she has arthritis in both knees, R knee swells more than L, with a couple degenerative disc disease in spine with arthritic changes in low back . Is not sleeping well, prefers to sleep on side. If she straightens them out at night they hurt less. Pain got to the point where she couldn't hike or walking the way she wanted and started having increased pain when walking at work.   30 years ago had arthroscopic sx on R knee. In February had back go out most recently during house work. Summer 2016 back hurt again moving a patient.     Back sends pain into L thigh at times anterior Low back pain along L5-sacrum start at L2     Has to take tramadol at times. 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 I really want to keep moving     Past Medical History:   Past Medical History   Diagnosis Date   . Depression    . Hypertensive disorder    . Hyperlipidemia    . Gastroesophageal reflux disease    . Mitral valve prolapse    . Anxiety    . Arthritis     . STD (sexually transmitted disease)      Medications: No outpatient prescriptions have been marked as taking for the 03/15/15 encounter (Clinical Support) with Asencion Gowda, PT.   Fall Risks: Low    Other Treatment/Prior Therapy:   Prior Hospitalization:   n/a   Hand Dominance: Dominant Hand: Left Involved Side: Involved Side: Bilateral   Outcome Measure:   Oswestry Score: 38 (percent )     % Pain Score: 67%   Rate Satisfaction with Current Function: 1/10  PLOF:   able to hike, walk uneven surfaces,  Ascend and descend stairs, rolling in bed, lift, transfer patients without problem  Living Environment: Type of Residence: Multi-story home (14 stairs )   Dwelling Entrance: # Steps to Enter: 6   Patient lives with: Living Arrangements: Alone    OBJECTIVE:    Vitals:           Observation/Posture/Gait/Integumentary:  Observation of posture: PPT pelvis, bilat fallen arches L >R with navicular drop    Knee Tracking: Lateral tracking bilat of knee increased navicular drop       Girth/Edema: not  assessed today     Integumentary: no scars, SST hypomobilities noted throughout lumbar spine   Palpation:    Muscle atrophy R paraspinals vs L in TL junction     Lumbar ROM:  Flexion: to floor, R paraspinal inferior to L   Extension: hinge point noted L1-2  SB to knee bi with pulling bi     Range of Motion: (degrees)      Right  AROM   Right PROM Knee   Left AROM   Left PROM   110  Flexion 115    -2  Extension -1    (blank fields were intentionally left blank)      Flexibility: 90-90 HS tightness + bilat     Strength:    R LE Strength  MMT = /5    L   4 p! Afterwards Hip Flexion 4- p! afterwards    Hip Extension    3+ Hip Abduction 3+    Hip Adduction    4- p! In knee  Knee Flexion 4+   4+ crepetus p!  Knee Extension 4+ crepetus   5 Ankle Dorsiflexion 5   5 Ankle Plantarflexion 5        (blank fields were intentionally left blank)    Functional Strength:   Sit to Stand: Independent  Squat:  Partial  Step-up: Not  assessed  Heel Raises:  Bilateral: able to do x 5       Balance:  SLS: R: 3 sec L: 6sec     Special Tests/Neurological Screen:  Sensation intact to light touch       Treatment Initial Visit:  Evaluation (30 min )   Patient Education on kinetic chain, discussed shoe options for larger toe box and roll of feet into kinetic chain, given brands that provide large toe box sizes, discussed anatomy, nature of chronic back pain, core and movement patterns.   SL and supine  A-B-A sleep positioning with pillow type/height education, support under waist as needed and long pillow between LE's or under femur with support proximal to distal     Therapeutic exercise with instruction in HEP and provided patient written and illustrated handout No  Manual N/A  Modalities: None  Barriers to rehabilitation: None  Rehab Potential:good  Is patient aware of diagnosis: Yes    Concha Norway, PT, DPT [place] # [phone]  03/15/2015    Time In/Out:  1050pm -1135pm  Total Treatment Time:  45 min       Plan of Care / Updated Plan of Care IPTC Medicare Provider #: [phone]                Patient Name: Katherine Freeman  MRN: [id number]  DOI: Onset of Problem / Injury: 02/24/15 DOS: N/A  SOC: 03/15/2015    Diagnosis:     ICD-10-CM    1. Chronic pain of both knees M25.561     M25.562     G89.29    2. Chronic bilateral low back pain without sciatica M54.5     G89.29        ASSESSMENT: the patient is a 64 y.o. female presenting with Low back pain and bilat knee pain R>L  who requires Physical Therapy for the following:  Impairments:    -decreased ROM   -MS tightness   -ms atrophy   -poor foot alignment    -decreased hip strength    -poor core and trunk control        Functional Limitations:  Back:   Twisting /reaching over to grab items out of fridge   Lifting   House work/yard work   Rolling in bed     Knees:   Extensive walking  Uneven surfaces and going up and down hills  Stairs ascending > descending   Rolling in bed - worsened in last 6 months or  so       Plan Of Care: Body Mechanics Education for reducing strain on ms system and allow for healing, NMR, Proprioceptive/ balance / gait Activites to re-educate position in space, Instruction in HEP in order to support therapeutic treatments and enhance carry over as well as independent care, Therapeutic Exercise, and Soft Tissue/Joint Mobilization to  bilat knee, lumbar spine, pelvic girdle    Frequency/Duration: 2 times a week for 8 weeks. Anticipated D/C date: 05/10/2015    Goals:  Date (Body Area, Impairment Goal, Functional   Activity, Target Performance) Time Frame Status Date/  Initial   03/15/2015 Pt will demo a functional squat with neutral LE's without c/o P! To return to squatting & lifting for ADL's daily without limitations  05/10/2015 Initial Eval    03/15/2015 Pt will report ability to amb with symmetrical gait pattern  x 1 hour mins at a time to ease ADL/ work  performance daily and return to hiking   05/10/2015 Initial Eval    03/15/2015 Patient will be independent with HEP and tolerate advancement of weight/exercises as needed to support clinical work and transition to self management upon discharge   05/10/2015 Initial Eval    03/15/2015 Pt will go up and down 12, 7 inch stairs with 1 rail and RP without c/o knee pain to ease stair navigation at home and in community daily  05/10/2015 Initial Eval    03/15/2015 Pt will demo hip hinge with neutral trunk and T-rex arms to keep load close to lift 10 lbs from knees to chest without c/o pain to increase safety with lifting for ADL's    05/10/2015 Initial Eval      Signature: Shawnie Pons [place], [place], DPT [place] # [id number] Date: 03/15/2015    Signature: Sharen Hint, MD ____________________________ Date:     Patient Name: Katherine Freeman  MRN: [id number]

## 2015-03-20 ENCOUNTER — Inpatient Hospital Stay
Payer: No Typology Code available for payment source | Attending: Internal Medicine | Admitting: Rehabilitative and Restorative Service Providers"

## 2015-03-20 DIAGNOSIS — M25561 Pain in right knee: Secondary | ICD-10-CM | POA: Insufficient documentation

## 2015-03-20 DIAGNOSIS — M545 Low back pain: Secondary | ICD-10-CM | POA: Insufficient documentation

## 2015-03-20 DIAGNOSIS — M25562 Pain in left knee: Secondary | ICD-10-CM | POA: Insufficient documentation

## 2015-03-20 NOTE — PT/OT Therapy Note (Signed)
DAILY NOTE   03/20/2015     Time In/Out: 9:45 - 10:30am Total Treatment Time: 45 minutes Visit Number:  2    POC Expires:      Payor: [employer] / Plan: [employer] / Product Type: *No Product type* /    # of Authorized Visits: 16 Visit #: 2      Diagnosis (Treating/Medical):     ICD-10-CM    1. Chronic pain of both knees M25.561     M25.562     G89.29            Subjective:  Katherine Freeman reports that she has put the orthotics back in her shoes.  Knees bother her the most R>L.   Functional Changes: ---    Objective:   Treatment:  Therapeutic Exercise:  Educated for HEP with quad sets->SLR with femoral IR/ER  Education on nature of pain, progression    NMR:    Prolonged holds into knee extension at femur and tibia ->COI  -also done with IR/ER of femur    Therapeutic Activities:    ---      Manual Therapy:   STM of contours of medial aspect of quads, joint line medially  Patella lateral and medial glides with STM tracing for hardest end feel  AP of femur with ER blocking tibia, C/R  Lateral shear of femur with knee flexion, extension      Current Measurements (ROM, Strength, Girth, Outcomes, etc.):      Assessment (response to treatment):   Pt improving with quality of knee extension and NM initiation/strength/endurance today.  B feet alignment def are issues but she currently lacks basic NM activation around the knee which should be worked in conjunction with mechanical tx of knee and foot.    Progress towards functional goals: ---    Patient requires continued skilled care to: mechanical tx of knee/foot, improve NM initiation/strength/endurance.    Plan:  Continue with Plan of Care    Dorthey Sawyer, PT, DPT [place]# [phone]  03/20/2015

## 2015-03-22 ENCOUNTER — Inpatient Hospital Stay
Payer: No Typology Code available for payment source | Attending: Internal Medicine | Admitting: Rehabilitative and Restorative Service Providers"

## 2015-03-22 DIAGNOSIS — G8929 Other chronic pain: Secondary | ICD-10-CM

## 2015-03-22 DIAGNOSIS — M25562 Pain in left knee: Secondary | ICD-10-CM | POA: Insufficient documentation

## 2015-03-22 DIAGNOSIS — M25561 Pain in right knee: Secondary | ICD-10-CM | POA: Insufficient documentation

## 2015-03-22 DIAGNOSIS — M545 Low back pain: Secondary | ICD-10-CM | POA: Insufficient documentation

## 2015-03-22 NOTE — PT/OT Therapy Note (Signed)
DAILY NOTE   03/22/2015     Time In/Out: 3:21pm-4:05pm Total Treatment Time: 44 minutes Visit Number:  3  POC Expires:  05/10/2015    Payor: Monia Pouch / Plan: [employer] / Product Type: *No Product type* /    # of Authorized Visits: 16 Visit #: 3      Diagnosis (Treating/Medical):     ICD-10-CM    1. Chronic pain of both knees M25.561     M25.562     G89.29            Subjective:  Katherine Freeman reports that her right knee is feeling better - she can bend and extend it more. Feeling like it is getting back to being her old knee.     Functional Changes: less painful with sleep, less painful walking around the house.     Objective:   Treatment:  Therapeutic Exercise:  Pt education: on progression of care     NMR:    SLS - assigned for HEP  Supine SAQ with SLR cuing for TKE - x12, for HEP     Therapeutic Activities:    ---      Manual Therapy:   STM of contours of medial aspect of quads, joint line medially, around patella   Patella lateral and medial glides with STM tracing for hardest end feel, grades III-IV   AP of femur with ER blocking tibia, C/R  STM VMO and distal adductors         Current Measurements (ROM, Strength, Girth, Outcomes, etc.):      Assessment (response to treatment):   Pt making improvements in reduction in pain and gains in neuromuscular control at end range. Provided manual therapy to further improve joint mobility and reduce edema during today's session. Followed that with progression of strengthening to improve terminal knee extension control and quad and hip flexor strengthen. She will continue to benefit from further alignment, neuromuscular, and strength/endurance work.     Progress towards functional goals: ---    Patient requires continued skilled care to: mechanical tx of knee/foot, improve NM initiation/strength/endurance.    Plan:  Continue with Plan of Care    [place], [place], DPT, [place] #[id number]    03/22/2015

## 2015-03-29 ENCOUNTER — Inpatient Hospital Stay: Payer: No Typology Code available for payment source | Attending: Internal Medicine

## 2015-03-29 DIAGNOSIS — M25561 Pain in right knee: Secondary | ICD-10-CM | POA: Insufficient documentation

## 2015-03-29 DIAGNOSIS — G8929 Other chronic pain: Secondary | ICD-10-CM | POA: Insufficient documentation

## 2015-03-29 DIAGNOSIS — M25562 Pain in left knee: Secondary | ICD-10-CM | POA: Insufficient documentation

## 2015-03-29 NOTE — PT/OT Therapy Note (Signed)
DAILY NOTE   03/29/2015     Time In/Out: 1:37pm-2:15pm Total Treatment Time: 43 minutes Visit Number:  4  POC Expires:  05/10/2015    Payor: Monia Pouch / Plan: [employer] / Product Type: *No Product type* /    # of Authorized Visits: 16 Visit #: 4    Diagnosis (Treating/Medical):     ICD-10-CM    1. Chronic pain of both knees M25.561     M25.562     G89.29          Subjective:  Katherine Freeman states that the knee are feeling ok. Since the last visit she has had some difficulty with stairs. She took a little time off from exercises due to pain. R is still is the worse of the two knees, but the L is feeling a bit worse. The R knee feels about 50% better from when she started care being more intermittent then before.    Functional Changes: less painful with sleep, less painful walking around the house.     Objective:   Treatment:  Therapeutic Exercise:  Pt education: on progression of care   SLR with hip ER x10 bilat  SLR x10 bilat  Bridges x15    NMR:  ---    Therapeutic Activities:    ---    Manual Therapy: bilat  STM of contours of medial aspect of quads, joint line medially, around patella   Patella lateral and medial glides with STM tracing for hardest end feel, grades III-IV   AP of femur with ER blocking tibia,  AP of tibia on femur with IR  STM VMO and distal adductors       Current Measurements (ROM, Strength, Girth, Outcomes, etc.):      Assessment (response to treatment):   Worked through [employer] strictions in both knees today as the R knee is starting to feel better. Began working on proximal stability and strength in glutes. Would benefit from more work here and to begin more CKC activity if she can tolerate it.     Progress towards functional goals: ---    Patient requires continued skilled care to: mechanical tx of knee/foot, improve NM initiation/strength/endurance.    Plan:  Continue with Plan of Care   CKC activity  Glute strength    Sima Matas [place] PT, [id number]      03/29/2015

## 2015-03-31 ENCOUNTER — Inpatient Hospital Stay: Payer: No Typology Code available for payment source | Admitting: Rehabilitative and Restorative Service Providers"

## 2015-04-03 ENCOUNTER — Inpatient Hospital Stay
Payer: No Typology Code available for payment source | Attending: Internal Medicine | Admitting: Rehabilitative and Restorative Service Providers"

## 2015-04-03 DIAGNOSIS — G8929 Other chronic pain: Secondary | ICD-10-CM | POA: Insufficient documentation

## 2015-04-03 DIAGNOSIS — M25561 Pain in right knee: Secondary | ICD-10-CM | POA: Insufficient documentation

## 2015-04-03 DIAGNOSIS — M25562 Pain in left knee: Secondary | ICD-10-CM

## 2015-04-03 NOTE — PT/OT Therapy Note (Signed)
DAILY NOTE   04/03/2015     Time In/Out: 115pm -200pm  Total Treatment Time: 45  minutes Visit Number:  5    POC Expires:  05/10/2015    Payor: Monia Pouch / Plan: [employer] / Product Type: *No Product type* /    # of Authorized Visits: 16 Visit #: 5    Diagnosis (Treating/Medical):     ICD-10-CM    1. Chronic pain of both knees M25.561     M25.562     G89.29          Subjective:  Bronda states that the pain comes less. Pain is located to horse shoe shape inferior knee. Mostly at night vs during the day. Back is feeling better.     Functional Changes: less painful with sleep, less painful walking around the house.     Objective:   Treatment:  Therapeutic Exercise:  Sit to stand squat 10 sec hold nose over toes reviewed and done in clinic   Educated on where to purchase wide toe box shoes, qualities to look for. To trial with and without insoles   Seated reciprocals with resistance at ankle     NMR:  ---    Therapeutic Activities:    ---    Manual Therapy: done bilat  STM of contours of medial aspect of quads, joint line medially, around patella   Patella lateral and medial glides with STM tracing for hardest end feel, grades III-IV   AP of femur with ER blocking tibia,  AP of tibia on femur with IR/ER blocking with foam roller   STM VMO and distal adductors   Seated long axis traction off edge of plinth with grade 3-4 IR/ER mobilization with STM along med joint line, quads    Pneumatic suction with knee flexion ROM off plinth               Current Measurements (ROM, Strength, Girth, Outcomes, etc.):      Assessment (response to treatment):   Notable edema present. Had relief of pain following treatment today. Progress HEP and CKC next visit as time was spent today reviewing shoe options for patient to wear     Progress towards functional goals: decreased pain     Goals:  Date  (Body Area, Impairment Goal, Functional     Activity, Target Performance)  Time Frame  Status  Date/   Initial    03/15/2015  Pt will  demo a functional squat with neutral LE's without c/o P! 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 and transition to self management upon discharge    05/10/2015  Initial Eval      03/15/2015  Pt will go up and down 12, 7 inch stairs with 1 rail and RP without c/o knee pain to ease stair navigation at home and in community daily   05/10/2015  Initial Eval      03/15/2015  Pt will demo hip hinge with neutral trunk and T-rex arms to keep load close to lift 10 lbs from knees  to chest without c/o pain to increase safety with lifting for ADL's      05/10/2015  Initial Ev          Patient requires continued skilled care to: mechanical tx of knee/foot, improve NM initiation/strength/endurance.    Plan:  Continue with Plan of Care   CKC activity  Glute strength    Shawnie Pons [place], [place], DPT [place] # [phone]        04/03/2015

## 2015-04-05 ENCOUNTER — Inpatient Hospital Stay
Payer: No Typology Code available for payment source | Attending: Internal Medicine | Admitting: Rehabilitative and Restorative Service Providers"

## 2015-04-05 DIAGNOSIS — M25561 Pain in right knee: Secondary | ICD-10-CM

## 2015-04-05 DIAGNOSIS — M25562 Pain in left knee: Secondary | ICD-10-CM | POA: Insufficient documentation

## 2015-04-05 DIAGNOSIS — G8929 Other chronic pain: Secondary | ICD-10-CM | POA: Insufficient documentation

## 2015-04-05 NOTE — PT/OT Therapy Note (Signed)
DAILY NOTE   04/05/2015     Time In/Out: 150pm 230 pm  Total Treatment Time: 40  minutes Visit Number:  6    POC Expires:  05/10/2015    Payor: Monia Pouch / Plan: [employer] / Product Type: *No Product type* /    # of Authorized Visits: 16 Visit #: 5    Diagnosis (Treating/Medical):     ICD-10-CM    1. Chronic pain of both knees M25.561     M25.562     G89.29          Subjective:  Katherine Freeman states that she didn't get the shoes. Pain is half as bad as when she started. Did notice that when she walks up and down on stairs knee feels tight but that swelling is getting better     Functional Changes: less pain     Objective:   Treatment:  Therapeutic Exercise:  Seated off plinth reciprocals with manual resistance at heel/ankle   LP DL slow and controlled cues for preventing valgus moment 70 deg x 30   Ab series SL 20 sec same and cross UE to knee. X 4 bilat with cues for form and core engagment      NMR:  ---    Therapeutic Activities:    ---    Manual Therapy: done bilat  STM of contours of medial aspect of quads, joint line medially, around patella   Patella lateral and medial glides with STM tracing for hardest end feel, grades III-IV   STM VMO and distal adductors   Seated long axis traction off edge of plinth with grade 3-4 IR/ER mobilization with STM along med joint line, quads    Pneumatic suction with knee flexion ROM off plinth               Current Measurements (ROM, Strength, Girth, Outcomes, etc.):   LE flexion test 1/5    Assessment (response to treatment):     Patient found leg press to be painfree and was able to perform single leg ab series. Unable to tolerate DL ab  series. Improved edema in R knee vs prior sessions. Limited mobility with focal SST restrictions in L knee to superior lateral patella apex. Continue progressing CKC strength and core facilitation     Progress towards functional goals: decreased pain     Goals:  Date  (Body Area, Impairment Goal, Functional     Activity, Target  Performance)  Time Frame  Status  Date/   Initial    03/15/2015  Pt will demo a functional squat with neutral LE's without c/o P! 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 and transition to self management upon discharge    05/10/2015  Initial Eval      03/15/2015  Pt will go up and down 12, 7 inch stairs with 1 rail and RP without c/o knee pain to ease stair navigation at home and in community daily   05/10/2015  Initial Eval      03/15/2015  Pt will demo hip  hinge with neutral trunk and T-rex arms to keep load close to lift 10 lbs from knees to chest without c/o pain to increase safety with lifting for ADL's      05/10/2015  Initial Ev          Patient requires continued skilled care to: mechanical tx of knee/foot, improve NM initiation/strength/endurance.    Plan:  Continue with Plan of Care   CKC activity  Glute strength    Shawnie Pons [place], [place], DPT [place] # [phone]        04/05/2015

## 2015-04-10 ENCOUNTER — Inpatient Hospital Stay: Payer: No Typology Code available for payment source | Attending: Internal Medicine

## 2015-04-10 DIAGNOSIS — M25561 Pain in right knee: Secondary | ICD-10-CM | POA: Insufficient documentation

## 2015-04-10 DIAGNOSIS — M25562 Pain in left knee: Secondary | ICD-10-CM | POA: Insufficient documentation

## 2015-04-10 DIAGNOSIS — G8929 Other chronic pain: Secondary | ICD-10-CM | POA: Insufficient documentation

## 2015-04-10 NOTE — PT/OT Therapy Note (Signed)
DAILY NOTE   04/10/2015     Time In/Out: 220pm 302 pm  Total Treatment Time: 42  minutes Visit Number:  7    POC Expires:  05/10/2015    Payor: Monia Pouch / Plan: [employer]ED / Product Type: *No Product type* /    # of Authorized Visits: 16 Visit #: 7    Diagnosis (Treating/Medical):     ICD-10-CM    1. Chronic pain of both knees M25.561     M25.562     G89.29          Subjective:  Hulda states that  Today isn't a good day, both knees are bothering her today. But as a whole are better overall. States that she had a hard time sleeping on her side last night (mostly the L knee)    Functional Changes: less pain     Objective:   Treatment:  Therapeutic Exercise: Instructed in and performed new exercises and for HEP  Clam shells 2x10  Foot shortening x10  Bridges 2x10    NMR:   Hooklying foot positioning  Foot shortening with ERHs and isometrics  -FM using toe extension to help initiate muscle contraction  -Moved to RS of rearfoot and LE in hooklying    Therapeutic Activities:    ---    Manual Therapy: done bilat  STM of contours of medial aspect of quads, joint line medially, around patella   Patella Med/Lat mobilizations GrIII   STM distal quadriceps and distal adductors   SMT to lateral quad and ITB working through ST restrictions around fibular head                  Current Measurements (ROM, Strength, Girth, Outcomes, etc.):   LE flexion test 1/5    Assessment (response to treatment):  Pt has improved patellar mobility today. Has significant pes planus in wt bearing. Worked through addressing good foot position and working in partial wt bearing to learn to control foot. There is both movement dysfunction at the foot and proximal weakness at the hip which was also addressed with HEP additions today that I believe are effecting knee mechanics. Continues to need work both proximally and distally to improve mechanical and motor control deficits to improve overall knee function.      Progress towards functional  goals: decreased pain     Goals:  Date  (Body Area, Impairment Goal, Functional     Activity, Target Performance)  Time Frame  Status  Date/   Initial    03/15/2015  Pt will demo a functional squat with neutral LE's without c/o P! 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 and transition to self management upon discharge    05/10/2015  Initial Eval      03/15/2015  Pt will go up and down 12, 7 inch stairs with 1 rail and RP without c/o knee pain to ease stair navigation at home and in community daily   05/10/2015  Initial Eval      03/15/2015  Pt will demo  hip hinge with neutral trunk and T-rex arms to keep load close to lift 10 lbs from knees to chest without c/o pain to increase safety with lifting for ADL's      05/10/2015  Initial Ev          Patient requires continued skilled care to: mechanical tx of knee/foot, improve NM initiation/strength/endurance.    Plan:  Continue with Plan of Care   CKC activity  Glute strength    Sima Matas [place] PT, [id number]    04/10/2015

## 2015-04-14 ENCOUNTER — Inpatient Hospital Stay: Payer: No Typology Code available for payment source | Attending: Internal Medicine

## 2015-04-14 DIAGNOSIS — G8929 Other chronic pain: Secondary | ICD-10-CM | POA: Insufficient documentation

## 2015-04-14 DIAGNOSIS — M25562 Pain in left knee: Secondary | ICD-10-CM | POA: Insufficient documentation

## 2015-04-14 DIAGNOSIS — M25561 Pain in right knee: Secondary | ICD-10-CM | POA: Insufficient documentation

## 2015-04-14 NOTE — PT/OT Therapy Note (Signed)
DAILY NOTE   04/14/2015     Time In/Out: 9:17am- 10:00am  Total Treatment Time: 43  minutes Visit Number:  8    POC Expires:  05/10/2015    Payor: Monia Pouch / Plan: [employer] / Product Type: *No Product type* /    # of Authorized Visits: 16 Visit #: 8    Diagnosis (Treating/Medical):     ICD-10-CM    1. Chronic pain of both knees M25.561     M25.562     G89.29          Subjective:  Gessica states she's getting better. Today is a pretty good day and not much pain last night. No pain walking this morning, but hasn't done a lot so far    Functional Changes: less pain with walking    Objective:   Treatment:  Therapeutic Exercise: Instructed in and performed new exercises and for HEP  ---    NMR:   Bridging with arch lift, prolonged holds with cuing for positioning and glute contraction 2x30s, education on performing at home without shoes  SLS with focus on arch lift first, then glute engagement, education on progressing at home  Green Tband side stepping with focus on arch support with glute engagement, x2L    Therapeutic Activities:    ---    Manual Therapy: done bilat  STM of contours of medial aspect of quads, borders of patella   Patella Med/Lat mobilizations G3  Patellar distraction with plunger with FM quad set  STM distal quadriceps and distal adductors in end range flexion  Anterior glide of tibia on femur in end range flexion with FM knee extension                    Current Measurements (ROM, Strength, Girth, Outcomes, etc.):       Assessment (response to treatment):  Focused today on incorporating foot stability with hip stability in WB activities to assist in improved knee alignment and stability. Balance significantly improved with cuing for activation of correct muscles     Progress towards functional goals: decreased pain     Goals:  Date  (Body Area, Impairment Goal, Functional     Activity, Target Performance)  Time Frame  Status  Date/   Initial    03/15/2015  Pt will demo a functional squat  with neutral LE's without c/o P! 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 and transition to self management upon discharge    05/10/2015  Progressed today 04/14/15 LO   03/15/2015  Pt will go up and down 12, 7 inch stairs with 1 rail and RP without c/o knee pain to ease stair navigation at home and in community daily   05/10/2015  Initial Eval      03/15/2015  Pt will demo hip hinge with neutral trunk and T-rex arms to keep load close to lift 10 lbs from knees to chest without c/o pain to increase safety with lifting for ADL's      05/10/2015  Initial Ev  Patient requires continued skilled care to: mechanical tx of knee/foot, improve NM initiation/strength/endurance.    Plan:  Continue with Plan of Care   CKC activity  Glute strength    Rema Fendt, DPT, [facility] [place] [id number]    04/14/2015

## 2015-04-17 ENCOUNTER — Inpatient Hospital Stay: Payer: No Typology Code available for payment source | Attending: Internal Medicine

## 2015-04-17 DIAGNOSIS — M25561 Pain in right knee: Secondary | ICD-10-CM

## 2015-04-17 DIAGNOSIS — M25562 Pain in left knee: Secondary | ICD-10-CM | POA: Insufficient documentation

## 2015-04-17 DIAGNOSIS — G8929 Other chronic pain: Secondary | ICD-10-CM | POA: Insufficient documentation

## 2015-04-17 NOTE — PT/OT Therapy Note (Signed)
DAILY NOTE   04/17/2015     Time In/Out: 2:20pm- 3:06pm  Total Treatment Time: 46  minutes Visit Number:  9    POC Expires:  05/10/2015    Payor: Monia Pouch / Plan: [employer] / Product Type: *No Product type* /    # of Authorized Visits: 16 Visit #: 9    Diagnosis (Treating/Medical):     ICD-10-CM    1. Chronic pain of both knees M25.561     M25.562     G89.29          Subjective:  Katherine Freeman states things are feeling good. The pain is less intense, and less frequent. She states she is beginning to feel like a new person. States she is walking more and trying to keep her foot in a better position. States she had a short bout of pain about 3-4/10 pain this morning.    Functional Changes: less pain with walking    Objective:   Treatment:  Therapeutic Exercise: Instructed in and performed new exercises and for HEP  ---    NMR:   Bridging with arch lift, prolonged holds with cuing for positioning and glute contraction 2x30s, education on performing at home without shoes  Seated foot positioning with prolonged holds  Standing wt shifts with arch lift and foot positioning in diagonal pattern  Green Tband side stepping with focus on arch support with glute engagement, x2L    Therapeutic Activities:    ---    Manual Therapy: done bilat  STM of contours of medial aspect of quads, borders of patella   Patella Med/Lat mobilizations G3  Patellar distraction with plunger with FM quad set  STM distal quadriceps and distal adductors in end range flexion  Anterior glide of tibia on femur in end range flexion with FM knee extension  Superior Navicular mobilizations Gr2  Calcaneal mobilizations Med/Lat Gr3              Current Measurements (ROM, Strength, Girth, Outcomes, etc.):       Assessment (response to treatment):  Katherine Freeman is progressing well and continues to show improvements with knee mobility and greater tolerance for activity without much pain. She has difficulty with maintaining arch in different positions, especially  when beginning to move from the static set-up position. This improves through session with practice. Continues to need retraining to keep foot in neutral position for wt bearing activity.    Progress towards functional goals: decreased pain     Goals:  Date  (Body Area, Impairment Goal, Functional     Activity, Target Performance)  Time Frame  Status  Date/   Initial    03/15/2015  Pt will demo a functional squat with neutral LE's without c/o P! 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 and transition to self management upon discharge    05/10/2015  Progressed today 04/14/15 LO   03/15/2015  Pt will go up and down 12, 7 inch stairs with 1 rail and RP without c/o knee pain to ease stair navigation at home  and in community daily   05/10/2015  Initial Eval      03/15/2015  Pt will demo hip hinge with neutral trunk and T-rex arms to keep load close to lift 10 lbs from knees to chest without c/o pain to increase safety with lifting for ADL's      05/10/2015  Initial Ev          Patient requires continued skilled care to: mechanical tx of knee/foot, improve NM initiation/strength/endurance.    Plan:  Continue with Plan of Care   CKC activity  Glute strength    Sima Matas [place] PT, [place] [facility]#[id number]    04/17/2015

## 2015-04-21 ENCOUNTER — Inpatient Hospital Stay
Payer: No Typology Code available for payment source | Attending: Internal Medicine | Admitting: Rehabilitative and Restorative Service Providers"

## 2015-04-21 DIAGNOSIS — M25562 Pain in left knee: Secondary | ICD-10-CM | POA: Insufficient documentation

## 2015-04-21 DIAGNOSIS — G8929 Other chronic pain: Secondary | ICD-10-CM

## 2015-04-21 DIAGNOSIS — M25561 Pain in right knee: Secondary | ICD-10-CM | POA: Insufficient documentation

## 2015-04-21 NOTE — PT/OT Therapy Note (Signed)
DAILY NOTE   04/21/2015     Time In/Out: 10:03 - 10:45  Total Treatment Time: 42 minutes Visit Number: 10    POC Expires:  05/10/2015    Payor: Monia Pouch / Plan: [employer] / Product Type: *No Product type* /    # of Authorized Visits: 16 Visit #: 10    Diagnosis (Treating/Medical):     ICD-10-CM    1. Chronic pain of both knees M25.561     M25.562     G89.29        Subjective:  Katherine Freeman reports that she's not having a good day. Has a big knot on her medial R knee that she is unsure what caused it - no bruise but very tender - pain started the last couple of days. I'm doing well with my feet but I'm still using my arch.       Objective:   Treatment:  Therapeutic Exercise: Instructed in and performed new exercises and for HEP  -Access your exercises online!  Visit URL: [email or web address]  Enter Access Code: [email or web address]    Supine hip adductor stretch x 20 reps tension on/tension off  RCB x 5' to end session with small hills and L3 resistance    NMR:   In supine, full PNF R LE patterns with hip flex/add/ER and ankle DF prolonged holds to facilitate core and transitioned to COI with hip ext/abd/IR and ankle PF throughout the full range to re-train core with various LE motion  Prolonged end range hip abduction iso holds with COI hip add/abd    Therapeutic Activities:    n/a    Manual Therapy:   ST mobs R hip adductors with FM's hip abd/adduction  ST border clearing adductor magnus  ST mobs medial quadriceps border    Patient in modified Thomas position, C-R with knee flex/ext and localization with abd/add and ST mobs medial quads and hip adductors  Superficial fascia mobs R hip adductors              Current Measurements (ROM, Strength, Girth, Outcomes, etc.):     LEFT R: 1/5 with sluggish activation    Static standing: genu valgum B (R> L) with compensated supination B during squat      Assessment (response to treatment):  Patient with significant ST restriction through R mid hip adductors that was focus of  today's session to decrease pain. Patient with increased ease with navigating stairs following MT techniques. Patient with very limited core during today's session.    Progress towards functional goals: see below    Goals:  Date  (Body Area, Impairment Goal, Functional     Activity, Target Performance)  Time Frame  Status  Date/   Initial    03/15/2015  Pt will demo a functional squat with neutral LE's without c/o P! 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 and transition to self management upon discharge    05/10/2015  In progress - updated today 04/21/2015  [facility]   03/15/2015  Pt will go up and down 12,  7 inch stairs with 1 rail and RP without c/o knee pain to ease stair navigation at home and in community daily   05/10/2015  In progress - improved ease with stairs  04/21/2015  [facility]   03/15/2015  Pt will demo hip hinge with neutral trunk and T-rex arms to keep load close to lift 10 lbs from knees to chest without c/o pain to increase safety with lifting for ADL's      05/10/2015  Initial Ev        Patient requires continued skilled care to: mechanical tx of knee/foot, improve NM initiation/strength/endurance; improve ST mobility R hip adductors to decrease tension and allow for return to improving mechanical alignment LE's to return patient to PLOF    Plan:  Continue with Plan of Care   Re-assess R hip adductors    Chipper Oman, DPT [phone]  04/21/2015

## 2015-04-24 ENCOUNTER — Inpatient Hospital Stay: Payer: No Typology Code available for payment source | Attending: Internal Medicine

## 2015-04-24 DIAGNOSIS — M25561 Pain in right knee: Secondary | ICD-10-CM | POA: Insufficient documentation

## 2015-04-24 DIAGNOSIS — M25562 Pain in left knee: Secondary | ICD-10-CM

## 2015-04-24 DIAGNOSIS — G8929 Other chronic pain: Secondary | ICD-10-CM | POA: Insufficient documentation

## 2015-04-24 NOTE — PT/OT Therapy Note (Signed)
DAILY NOTE   04/24/2015     Time In/Out: 2:15- 3:00pm Total Treatment Time: 45 minutes Visit Number: 11    POC Expires:  05/10/2015    Payor: Monia Pouch / Plan: [employer] INSURED / Product Type: *No Product type* /    # of Authorized Visits: 16 Visit #: 11    Diagnosis (Treating/Medical):     ICD-10-CM    1. Chronic pain of both knees M25.561     M25.562     G89.29        Subjective:  Neave reports that her leg feels better then the other day when she was here for her treatment session. Still feels a knot in her leg but not as intense as before      Objective:   Treatment:  Therapeutic Exercise: Instructed in and performed new exercises and for HEP  HEP Access Code for reference   [email or web address]  Supine hip adductor stretch x 20 reps tension on/tension off  Prone Quad stretching with strap 3x20sec      NMR:   In supine, full PNF R LE patterns with hip flex/add/ER and ankle DF prolonged holds to facilitate core and transitioned to COI with hip ext/abd/IR and ankle PF throughout the full range to re-train core with various LE motion  Prolonged end range hip abduction iso holds with COI hip add/abd    Therapeutic Activities:    n/a    Manual Therapy:   ST mobs R hip adductors with FM's hip abd/adduction  ST border clearing adductor magnus  ST mobs medial quadriceps border  Patient in modified Thomas position, C-R with knee flex/ext and localization with abd/add and ST mobs medial quads and hip adductors  Superficial fascia mobs R hip adductors  Plunging to distal medial quads supine              Current Measurements (ROM, Strength, Girth, Outcomes, etc.):     LEFT R: 1/5 with sluggish activation    Static standing: genu valgum B (R> L) with compensated supination B during squat      Assessment (response to treatment):  Pt has improved ST mobility post session. Has increased tightness in the medial quad likely as a result of changing mechanics of walking. Continues to show progress with ROM and activity at home.  Continues  to have some gait deviations in the foot that continue to drive knee dysfunction.     Progress towards functional goals: see below    Goals:  Date  (Body Area, Impairment Goal, Functional     Activity, Target Performance)  Time Frame  Status  Date/   Initial    03/15/2015  Pt will demo a functional squat with neutral LE's without c/o P! 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 and transition to self management upon discharge    05/10/2015  In progress - updated today 04/24/2015  [facility]   03/15/2015  Pt will go up and down 12, 7 inch stairs with 1 rail and RP without c/o knee pain to ease stair  navigation at home and in community daily   05/10/2015  In progress - improved ease with stairs  04/24/2015  [facility]   03/15/2015  Pt will demo hip hinge with neutral trunk and T-rex arms to keep load close to lift 10 lbs from knees to chest without c/o pain to increase safety with lifting for ADL's      05/10/2015  Initial Ev        Patient requires continued skilled care to: mechanical tx of knee/foot, improve NM initiation/strength/endurance; improve ST mobility R hip adductors to decrease tension and allow for return to improving mechanical alignment LE's to return patient to PLOF    Plan:  Continue with Plan of Care   Re-assess R hip adductors    Warnell Bureau PT, DPT [phone]    04/24/2015

## 2015-04-26 ENCOUNTER — Inpatient Hospital Stay: Payer: No Typology Code available for payment source | Attending: Internal Medicine

## 2015-04-26 DIAGNOSIS — G8929 Other chronic pain: Secondary | ICD-10-CM

## 2015-04-26 DIAGNOSIS — M25562 Pain in left knee: Secondary | ICD-10-CM | POA: Insufficient documentation

## 2015-04-26 DIAGNOSIS — M25561 Pain in right knee: Secondary | ICD-10-CM | POA: Insufficient documentation

## 2015-04-27 ENCOUNTER — Encounter (INDEPENDENT_AMBULATORY_CARE_PROVIDER_SITE_OTHER): Payer: Self-pay | Admitting: Internal Medicine

## 2015-04-28 ENCOUNTER — Ambulatory Visit (INDEPENDENT_AMBULATORY_CARE_PROVIDER_SITE_OTHER): Payer: No Typology Code available for payment source | Admitting: Internal Medicine

## 2015-04-28 ENCOUNTER — Encounter (INDEPENDENT_AMBULATORY_CARE_PROVIDER_SITE_OTHER): Payer: Self-pay | Admitting: Internal Medicine

## 2015-04-28 VITALS — BP 131/80 | HR 67 | Temp 98.0°F | Ht 62.52 in | Wt 183.0 lb

## 2015-04-28 DIAGNOSIS — M5489 Other dorsalgia: Secondary | ICD-10-CM

## 2015-04-28 DIAGNOSIS — M79651 Pain in right thigh: Secondary | ICD-10-CM

## 2015-04-28 DIAGNOSIS — G8929 Other chronic pain: Secondary | ICD-10-CM

## 2015-04-28 DIAGNOSIS — M25561 Pain in right knee: Secondary | ICD-10-CM

## 2015-04-28 DIAGNOSIS — M25569 Pain in unspecified knee: Secondary | ICD-10-CM

## 2015-04-28 DIAGNOSIS — M2550 Pain in unspecified joint: Secondary | ICD-10-CM

## 2015-04-28 DIAGNOSIS — M25562 Pain in left knee: Secondary | ICD-10-CM

## 2015-04-28 NOTE — Patient Instructions (Signed)
1. Suggest tramadol 50 mg as needed   2. Suggest knee braces and weight loss, movefree    3. Order [place] of Rt thigh   4. Will consider Synvisc injection  5. Follow 3 months

## 2015-04-28 NOTE — Progress Notes (Signed)
Has the patient sought any care outside of the [facility]? YES    -Refer to care team-   Or  List Specialists:    Yes, listed under Care Team.

## 2015-04-28 NOTE — Progress Notes (Signed)
Initial Rheumatology Consultation    Chief Complaint:     Chief Complaint   Patient presents with   . Follow-up         HPI:   This patient is a 64 y.o. year old female OA of knees, lumbar spondylosis, colonic diverticulosis referred by Lorella Nimrod, MD to evaluate joint pain. She has chronic pain of knees for over 5 yrs (R>L). Her pain is worse with walking. She saw rheumatology Dr. Albin Felling. She was diagnosed OA and had steroid injection of Rt in 8/16. She was given mobic and had lower GI bleeding. She went to ER and was found hematochezia. She takes tylenol as needed.     XR of Rt knee in 7/16 showed mild OA. XR of L-spine in 7/16 showed lumbar spondylosis, multilevel degenerative disc and facet disease, from L3-4 through L5-S1. She has back pain for a month with radiation to her left leg.     She takes tramadol 50 mg as needed and does PT of knees. She has pain and feels a mass of Rt thigh. No h/o injury. She has difficulty walk.     She had history of colonic diverticulosis diagnosed by colonoscopy in 2014. Bleeding scan was negative for active bleeder. EGD showed a polyp in the distal duodenal bulb measuring 1x2 cm which was biopsied. Colonoscopy showed severe colonic diverticulosis.    Denies any fevers, chills or night sweats.  Denies fatigue or weight loss. Denies rash, malar rash or photosensitivity, discoid lesions, psoriasis, nail changes or Raynaud's. Denies hair loss or alopecia. Denies Sicca symptoms, nasal or oral ulcers or epistaxis. Denies cough, shortness of breath, chest pain, palpation or exercise intolerance. Denies nausea, vomiting, diarrhea, melena, hematochezia or dysphagia.  Denies dysuria and hematuria. Denies chronic headaches, paresthesias, focal weakness or seizure activity.      Denies any family history of lupus, rheumatoid arthritis, connective tissue disease, psoriasis, inflammatory bowel disease, ankylosing spondylitis, multiple sclerosis or other autoimmune diseases.            PMSH:     Past Medical History   Diagnosis Date   . Depression    . Hypertensive disorder    . Hyperlipidemia    . Gastroesophageal reflux disease    . Mitral valve prolapse    . Anxiety    . Arthritis    . STD (sexually transmitted disease)        Past Surgical History   Procedure Laterality Date   . Egd, colonoscopy  01/22/2013     Procedure: EGD, COLONOSCOPY;  Surgeon: Luberta Mutter, MD;  Location: [facility] SUR[facility] OR;  Service: Gastroenterology;  Laterality: N/A;  EGD, COLONOSCOPY W/IVA   . Arthroscopy knee, diagnostic       right   . Egd, colonoscopy N/A 01/23/2015     Procedure: EGD, COLONOSCOPY;  Surgeon: Lestine Mount, MD;  Location: [facility];  Service: Gastroenterology;  Laterality: N/A;   . Breast biopsy Left 2010     abnormal mammogram, benign       Allergies:     Allergies   Allergen Reactions   . Adhesive [Wound Dressing Adhesive] Rash       Meds:     Current outpatient prescriptions:   .  hydrochlorothiazide (HYDRODIURIL) 12.5 MG tablet, Take 1 tablet (12.5 mg total) by mouth nightly., Disp: 90 tablet, Rfl: 3  .  rosuvastatin (CRESTOR) 10 MG tablet, Take 1 tablet (10 mg total) by mouth daily., Disp: 90 tablet, Rfl: 3  .  sertraline (ZOLOFT) 100 MG tablet, Take 1 tablet (100 mg total) by mouth nightly., Disp: 90 tablet, Rfl: 3  .  valACYclovir HCL (VALTREX) 500 MG tablet, Take 1 tablet (500 mg total) by mouth daily., Disp: 90 tablet, Rfl: 3    FH:     Family History   Problem Relation Age of Onset   . Heart disease Mother    . Stroke Mother    . Hyperlipidemia Mother    . Heart attack Mother 62     chf, passed away   . Hypertension Mother    . Diabetes Mother    . Cancer Sister 12     lung cancer, smoker   . Cancer Brother 41     cancer of the stomach   . Alcohol abuse Father    . Cancer Other 32     ca of small intestine       SH:     Social History     Social History   . Marital Status: Divorced     Spouse Name: N/A   . Number of Children: 0   . Years of Education: N/A     Occupational  History   . RN      [employer]     Social History Main Topics   . Smoking status: Former Smoker -- 0.25 packs/day for 20 years     Quit date: 01/21/1993   . Smokeless tobacco: Never Used      Comment: off and on, no more than 4-5 cigarettes/day   . Alcohol Use: Yes      Comment: 1-2 glasses wine 1-2 times/year   . Drug Use: No   . Sexual Activity:     Partners: Male     Birth Control/ Protection: Post-menopausal     Other Topics Concern   . Not on file     Social History Narrative       ROS:   All other systems reviewed and negative except as described above as HPI.       PHYSICAL EXAM:     Filed Vitals:    04/28/15 1147   BP: 131/80   Pulse: 67   Temp: 98 F (36.7 C)       General appearance - alert, well appearing, and in no distress  Mental status - alert, oriented to person, place, and time  Eyes - pupils equal and reactive, extraocular eye movements intact  Ears - bilateral TM's and external ear canals normal  Nose - normal and patent, no erythema, discharge or polyps  Mouth - mucous membranes moist, pharynx normal without lesions  Neck - supple, no significant adenopathy  Lymphatics - no palpable lymphadenopathy, no hepatosplenomegaly  Chest - clear to auscultation, no wheezes, rales or rhonchi, symmetric air entry  Heart - normal rate, regular rhythm, normal S1, S2, no murmurs, rubs, clicks or gallops  Abdomen - soft, nontender, nondistended, no masses or organomegaly  Back exam - full range of motion, no tenderness, palpable spasm or pain on motion  Neurological - alert, oriented, normal speech, no focal findings or movement disorder noted  Musculoskeletal - Good range of cervical spine.  No crepitus on TMJ auscultation.  No tenderness of MCPs and PIPs. Good range of motion of shoulders, elbows, wrists and small joints of hands. Pain and may be a mass of Rt thigh. Good range of motion of hips, knees and ankles.  Knees crepitus with range of motion of knees without effusions. Ankles without  effusions.  No  tenderness of MTPs or effusions.    Extremities - peripheral pulses normal, no pedal edema, no clubbing or cyanosis  Skin - normal coloration and turgor, no rashes, no suspicious skin lesions noted        Labs:         Radiology:     XR of knees on 02/27/15 showed mild degenerative changes of B/L knees.      ASSESSMENT:   Katherine Freeman is a 64 y.o. female OA of knees, lumbar spondylosis, colonic diverticulosis. She has OA of knees. Pain and may be a mass of Rt thigh.       PLAN:   1. Suggest tramadol 50 mg as needed   2. Suggest knee braces and weight loss, movefree    3. Order [place] of Rt thigh   4. Will consider Synvisc injection  5. Follow 3 months

## 2015-05-01 NOTE — PT/OT Therapy Note (Signed)
DAILY NOTE   04/26/2015     Time In/Out: 10:00- 10:45am Total Treatment Time: 45 minutes Visit Number: 12    POC Expires:  05/10/2015    Payor: Monia Pouch / Plan: [employer] / Product Type: *No Product type* /    # of Authorized Visits: 16 Visit #: 12    Diagnosis (Treating/Medical):     ICD-10-CM    1. Chronic pain of both knees M25.561     M25.562     G89.29        Subjective:  Lynita pt reports that the leg is feeling better but feels a little swollen, denies warmth of leg. She would prefer to work on correct foot posture today which we have worked through in the past, and give the leg a break today. Overall knees are feeling better she states.      Objective:   Treatment:  Therapeutic Exercise: Instructed in and performed new exercises and for HEP (Billed with NMR)  HEP Access Code for reference   [email or web address]  Foot shortenting in sitting and Standing x20 each      NMR:   Manual positioning of foot in neutral postion - prolonged holds aganst resistance in calcaneus and 1st ray  Hooklying foot positioning with neutral calcaneus  -progressed to bridging  Sitting foot positioning with wt shift  -progressed into standing      Therapeutic Activities:    n/a    Manual Therapy:   STM to bony contours of calcaneus and talus as well as tracing along inferoor borders of malleoli  Lateral calcaneal mobilizations with medial gapping utilizing C-R    Effleurage to distal quad     Current Measurements (ROM, Strength, Girth, Outcomes, etc.):     LEFT R: 1/5 with sluggish activation    Static standing: genu valgum B (R> L) with compensated supination B during squat      Assessment (response to treatment):  Pt has difficulty getting foot into neutral position on her own, but progresses well and improves through the session. Worked through various positions and pt was able to find neutral foot position on her own at the end of our session. Continues to need work both distally and proximally to knee to improve knee movement  mechanics.    Progress towards functional goals: see below    Goals:  Date  (Body Area, Impairment Goal, Functional     Activity, Target Performance)  Time Frame  Status  Date/   Initial    03/15/2015  Pt will demo a functional squat with neutral LE's without c/o P! 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 and transition to self management upon discharge    05/10/2015  In progress - updated today 05/01/2015  [facility]   03/15/2015  Pt will go up and down 12, 7 inch stairs with 1 rail and RP without c/o knee pain to ease stair navigation at home and in community daily   05/10/2015  In progress - improved ease with stairs  05/01/2015  [facility]   03/15/2015  Pt will demo hip hinge with neutral trunk and T-rex arms to keep load close to lift 10 lbs from knees to chest without c/o pain to increase safety with lifting for ADL's      05/10/2015  Initial Ev        Patient requires continued skilled care to: mechanical tx of knee/foot, improve NM initiation/strength/endurance; improve ST mobility R hip adductors to decrease tension and allow for return to improving mechanical alignment LE's to return patient to PLOF    Plan:  Continue with Plan of Care   Re-assess R LE for swelling.    Warnell Bureau PT, DPT [phone]    04/26/2015

## 2015-05-03 ENCOUNTER — Inpatient Hospital Stay
Payer: No Typology Code available for payment source | Attending: Internal Medicine | Admitting: Rehabilitative and Restorative Service Providers"

## 2015-05-03 DIAGNOSIS — M25561 Pain in right knee: Secondary | ICD-10-CM | POA: Insufficient documentation

## 2015-05-03 DIAGNOSIS — G8929 Other chronic pain: Secondary | ICD-10-CM | POA: Insufficient documentation

## 2015-05-03 DIAGNOSIS — M25562 Pain in left knee: Secondary | ICD-10-CM | POA: Insufficient documentation

## 2015-05-03 NOTE — PT/OT Therapy Note (Addendum)
DAILY NOTE   04/26/2015     Time In/Out: 1135am -1215pm  Total Treatment Time: 40 minutes Visit Number: 14    POC Expires:  05/10/2015    Payor: Monia Pouch / Plan: [employer] / Product Type: *No Product type* /    # of Authorized Visits: 16 Visit #: 13    Diagnosis (Treating/Medical):     ICD-10-CM    1. Chronic pain of both knees M25.561     M25.562     G89.29        Subjective:  Katherine Freeman  States she is feeling better with her knees. Has pain with stairs still and bending knee (R) fully back due to mass or point of swelling in R thigh. Will get [place] in thigh on Friday for diagnostics. Does feel like it is improving some, gets better when she limits how much she is feeling       Objective:   Treatment:  Therapeutic Exercise: Instructed in and performed new exercises and for HEP (Billed with NMR)  HEP Access Code for reference   [id number]  Foot shortenting in sitting and bridging x20 each      NMR:    Manual positioning of foot in neutral postion - prolonged holds aganst resistance in calcaneus and 1st ray  Hooklying foot positioning with neutral calcaneus, resistance to medial forefoot hip abduction prolonged hold working through phasic shakes -progressed to bridging      Therapeutic Activities:    n/a    Manual Therapy:   STM to bony contours of calcaneus and talus as well as tracing along inferoor borders of malleoli  L Lateral calcaneal mobilizations with medial gapping utilizing C-R    Clearing bony contours along cuneiforms, cuboid and plantar surface. L>R   1/2 foam roll perpendicular resisted hip IR FM- REM to cuneiform 1-3 with inferior / traction force L  1/2 foam roll parallel with shear to calc neutral REM to cuneiform monitoring for navicular up glide      Current Measurements (ROM, Strength, Girth, Outcomes, etc.):       Static standing: genu valgum B (R> L) with compensated supination B during squat- continued       Assessment (response to treatment):  Following L foot mobilization to cuneiform  patient was able to engage foot intrinsics better in WB positions which keeping neutral foot position. Began working through R foot but limited due to time. Needs continued wirjdistally and proximally to knee to improve knee movement mechanics.    Progress towards functional goals: see below    Goals:  Date  (Body Area, Impairment Goal, Functional     Activity, Target Performance)  Time Frame  Status  Date/   Initial    03/15/2015  Pt will demo a functional squat with neutral LE's without c/o P! 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 and transition to self management upon discharge    05/10/2015  In progress - updated today 05/03/2015  [facility]   03/15/2015  Pt will go up and down 12, 7  inch stairs with 1 rail and RP without c/o knee pain to ease stair navigation at home and in community daily   05/10/2015  In progress - improved ease with stairs  05/03/2015  [facility]   03/15/2015  Pt will demo hip hinge with neutral trunk and T-rex arms to keep load close to lift 10 lbs from knees to chest without c/o pain to increase safety with lifting for ADL's      05/10/2015  Initial Ev        Patient requires continued skilled care to: mechanical tx of knee/foot, improve NM initiation/strength/endurance; improve ST mobility R hip adductors to decrease tension and allow for return to improving mechanical alignment LE's to return patient to PLOF    Plan:  Continue with Plan of Care   FU with R LE mass     Shawnie Pons [place], [place], DPT [place] # [phone]    05/03/2015

## 2015-05-05 ENCOUNTER — Other Ambulatory Visit: Payer: Self-pay | Admitting: Internal Medicine

## 2015-05-05 ENCOUNTER — Inpatient Hospital Stay
Payer: No Typology Code available for payment source | Attending: Internal Medicine | Admitting: Rehabilitative and Restorative Service Providers"

## 2015-05-05 DIAGNOSIS — G8929 Other chronic pain: Secondary | ICD-10-CM | POA: Insufficient documentation

## 2015-05-05 DIAGNOSIS — M25562 Pain in left knee: Secondary | ICD-10-CM | POA: Insufficient documentation

## 2015-05-05 DIAGNOSIS — M25561 Pain in right knee: Secondary | ICD-10-CM | POA: Insufficient documentation

## 2015-05-05 NOTE — PT/OT Therapy Note (Addendum)
DAILY NOTE   05/05/2015    Time In/Out:   920am - 1000 am total Treatment Time: 40 minutes Visit Number: 15    POC Expires:  05/10/2015    Payor: Monia Pouch / Plan: [employer] / Product Type: *No Product type* /    # of Authorized Visits: 16 Visit #: 14    Diagnosis (Treating/Medical):     ICD-10-CM    1. Chronic pain of both knees M25.561     M25.562     G89.29        Subjective:  Katherine Freeman  States her mass is decreased in size goes to [place] today to get [place]       Objective:   Treatment:  Therapeutic Exercise: Instructed in and performed new exercises and for HEP (Billed with NMR)  HEP Access Code for reference   [email or web address]  Foot shortenting in sitting and bridging x20 each    Prolonged holds PF INV   LE ext over ball bridges 5 sec holds 2 x 10 with ms burning and fatigue but able to do (given for HEP)  LP with foot doming 3 x 15 105# cues for knee positioning   Bridging x 20 attempted SL unable to do staggered stance cues for knee positon and tracking as well as WB through feet   Seated reciprocals bilat open chain     NMR:    ---      Therapeutic Activities:    n/a    Manual Therapy: done R   STM to bony contours of calcaneus and talus as well as tracing along inferoor borders of malleoli  R Lateral calcaneal mobilizations with medial gapping utilizing C-R    Clearing bony contours along cuneiforms, cuboid and plantar surface. L>R   1/2 foam roll perpendicular resisted hip IR FM- REM to cuneiform 1-3 with inferior / traction force R  1/2 foam roll parallel with shear to calc neutral REM to cuneiform monitoring for navicular up glide   Seated traction to R knee    Current Measurements (ROM, Strength, Girth, Outcomes, etc.):       Static standing: genu valgum B (R> L) with compensated supination B during squat- continued       Assessment (response to treatment):  Patient able to have enough self awareness that she can tell her foot position impacts her knees and that her orthotics are not as comfortable as before.  Ms fatigued easily when performing longer length of exercises       Progress towards functional goals: see below    Goals:  Date  (Body Area, Impairment Goal, Functional     Activity, Target Performance)  Time Frame  Status  Date/   Initial    03/15/2015  Pt will demo a functional squat with neutral LE's without c/o P! 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 and transition to self management upon discharge    05/10/2015  In progress - updated today 05/05/2015  [facility]   03/15/2015  Pt will go up and down 12, 7  inch stairs with 1 rail and RP without c/o knee pain to ease stair navigation at home and in community daily   05/10/2015  In progress - improved ease with stairs  05/05/2015  [facility]   03/15/2015  Pt will demo hip hinge with neutral trunk and T-rex arms to keep load close to lift 10 lbs from knees to chest without c/o pain to increase safety with lifting for ADL's      05/10/2015  Initial Ev        Patient requires continued skilled care to: mechanical tx of knee/foot, improve NM initiation/strength/endurance; improve ST mobility R hip adductors to decrease tension and allow for return to improving mechanical alignment LE's to return patient to PLOF    Plan:  Continue with Plan of Care   FU with R LE mass     Shawnie Pons [place], [place], DPT [place] # [phone]    05/05/2015

## 2015-05-06 ENCOUNTER — Other Ambulatory Visit: Payer: Self-pay

## 2015-05-09 ENCOUNTER — Inpatient Hospital Stay: Payer: No Typology Code available for payment source | Admitting: Rehabilitative and Restorative Service Providers"

## 2015-05-10 ENCOUNTER — Encounter (INDEPENDENT_AMBULATORY_CARE_PROVIDER_SITE_OTHER): Payer: Self-pay

## 2015-05-10 ENCOUNTER — Telehealth (INDEPENDENT_AMBULATORY_CARE_PROVIDER_SITE_OTHER): Payer: Self-pay | Admitting: Internal Medicine

## 2015-05-10 DIAGNOSIS — R2241 Localized swelling, mass and lump, right lower limb: Secondary | ICD-10-CM

## 2015-05-10 DIAGNOSIS — M79651 Pain in right thigh: Secondary | ICD-10-CM

## 2015-05-10 NOTE — Telephone Encounter (Signed)
Contacted pt and informed her I was going to mail the orders to her home address.    -Cuong Moorman

## 2015-05-10 NOTE — Telephone Encounter (Signed)
Mass of rt thigh. Order MRI. Please call pt and mail the order to pt. Thx.

## 2015-05-11 ENCOUNTER — Inpatient Hospital Stay: Payer: No Typology Code available for payment source | Admitting: Rehabilitative and Restorative Service Providers"

## 2015-05-15 ENCOUNTER — Inpatient Hospital Stay
Payer: No Typology Code available for payment source | Attending: Internal Medicine | Admitting: Rehabilitative and Restorative Service Providers"

## 2015-05-15 DIAGNOSIS — M25561 Pain in right knee: Secondary | ICD-10-CM | POA: Insufficient documentation

## 2015-05-15 DIAGNOSIS — G8929 Other chronic pain: Secondary | ICD-10-CM | POA: Insufficient documentation

## 2015-05-15 DIAGNOSIS — M25562 Pain in left knee: Secondary | ICD-10-CM | POA: Insufficient documentation

## 2015-05-15 NOTE — PT/OT Therapy Note (Signed)
DAILY NOTE   05/15/2015    Time In/Out:  1102am -1155 am total Treatment Time: 40 +10 minutes Vaso Visit Number: 16    POC Expires:  05/10/2015    Payor: Monia Pouch / Plan: [employer]ED / Product Type: *No Product type* /    # of Authorized Visits:   Visit #:      Diagnosis (Treating/Medical):     ICD-10-CM    1. Chronic pain of both knees M25.561     M25.562     G89.29        Subjective:  Katherine Freeman  States she is not feeling well. I am disgusted today. My L knee feels weak, feels like it is unstable - started yesterday at work. R knee is still swollen and more painful Saturday night. I am trying to stay positive but I want to know what is going on. This weekend going up on the R leg was painful , today I can go up better.       Objective:   Treatment:  Therapeutic Exercise: Instructed in and performed new exercises and for HEP  ---    NMR:    LE ext over ball bridges 5 sec holds 2 x 10 with ms burning and fatigue but able to do   LE ext short range COI L knee only   Standing L LE SLS with UE assist with red TB P-A pressure 10 deg flexion to ext for stabilization       Therapeutic Activities:    n/a    Manual Therapy:  done bi  Seated long axis traction off edge of plinth with grade 3-4 IR/ER mobilization with STM along med/lat joint line, quads   STM along lateral quad, ITB, clearing bony contours along L joint line and patella     Effleurage to R knee for edema   Pneumatic suction to R knee for edema management   K-tape for stability to L knee Y strip x 1               Current Measurements (ROM, Strength, Girth, Outcomes, etc.):   Knee flexion R: 105 deg limited by pain   L: 127 deg       Modalities: Vasopneumatic compression on gameready with ice to R knee to reduce edema in elevated supine position x 10 min 30: on 30 sec off mod compression       Assessment (response to treatment):  Patient with decreased pain following PT. Ice was helpful to patient and found relief of R knee pain following vaso treatment. L  knee felt more stable following manual and K-tape was able to do NMR with decreased pain. There were palpable masses and limitations along L joint line which did not change in size following treatment. Masses were 1/2 in length and finger width     Progress towards functional goals: see below    Goals:  Date  (Body Area, Impairment Goal, Functional     Activity, Target Performance)  Time Frame  Status  Date/   Initial    03/15/2015  Pt will demo a functional squat with neutral LE's without c/o P! 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 and transition to self management upon discharge    05/10/2015  In progress - updated today 05/15/2015  [facility]   03/15/2015  Pt will go up and down 12, 7 inch stairs with 1 rail and RP without c/o knee pain to ease stair navigation at home and in community daily   05/10/2015  In progress - improved ease with stairs  05/15/2015  [facility]   03/15/2015  Pt will demo hip hinge with neutral trunk and T-rex arms to keep load close to lift 10 lbs from knees to chest without c/o pain to increase safety with lifting for ADL's      05/10/2015  Initial Ev        Patient requires continued skilled care to: mechanical tx of knee/foot, improve NM initiation/strength/endurance; improve ST mobility R hip adductors to decrease tension and allow for return to improving mechanical alignment LE's to return patient to PLOF    Plan:  Continue with Plan of Care       [facility] [place], [place], DPT [place] # [phone]    05/15/2015

## 2015-05-16 ENCOUNTER — Inpatient Hospital Stay
Payer: No Typology Code available for payment source | Attending: Internal Medicine | Admitting: Rehabilitative and Restorative Service Providers"

## 2015-05-16 DIAGNOSIS — M25562 Pain in left knee: Secondary | ICD-10-CM

## 2015-05-16 DIAGNOSIS — G8929 Other chronic pain: Secondary | ICD-10-CM | POA: Insufficient documentation

## 2015-05-16 DIAGNOSIS — M25561 Pain in right knee: Secondary | ICD-10-CM | POA: Insufficient documentation

## 2015-05-16 NOTE — PT/OT Therapy Note (Signed)
DAILY NOTE   05/16/2015    Time In/Out:  1150am - 1245pm  total Treatment Time: 45 min (+10 min vasopneumatic)  Visit Number: 17    POC Expires:  05/10/2015    Payor: Monia Pouch / Plan: [employer] INS[employer]ED / Product Type: *No Product type* /    # of Authorized Visits: 12 Visit #: 2    Diagnosis (Treating/Medical):     ICD-10-CM    1. Chronic pain of both knees M25.561     M25.562     G89.29        Subjective:  Katherine Freeman  States she has iced and feels like her leg and the leg feels better. 2-3/10 pain She can still feel the mass but it is much better. Did go shopping and felt like her left leg was going to give out       Objective:   Treatment:  Therapeutic Exercise:   All on theraball   Modified prone on elbow standing sho flex/ext for core strengthening   Supine heels on ball 90-90 glute lift 10 sec holds x 10   Supine HS heel on ball heel drags for HS engagement with fatigue 2 x 15   LE extension pattern glute lift 5 sec holds x 10     NMR:    Prolonged hold L LE ext abd PF pattern ---> COI limited range for control   LE DF glute lift with MC in core for facilitation, UE for facilitation and axial elongation prolonged hold unable to tolerate movement of LE with lift     Therapeutic Activities:    n/a    Manual Therapy: done bi  Effleurage to R knee for edema   STM to pes anserine, lateral HS, medial adductor magnus and ITB insertion with TTP and decreased tenderness following treatment   STM along lateral quad, ITB, clearing bony contours along bi joint line and patella             Current Measurements (ROM, Strength, Girth, Outcomes, etc.):   Knee flexion R: 110 deg limited by pain   L: 127 deg       Modalities: LE R elevated, vasopneumatic compression ice 30 sec on / off mod pressure x 10 min R knee for edema and pain control with good results     Assessment (response to treatment):  Patient slowly returning back to pain free status prior to appearance of mass on R LE. Worked on stability of L knee today to address  buckling, unable to reproduce pain and instability in L knee in clinic. Continue with core and stabilization at clinic     Progress towards functional goals: walk up and down steps reciprocally     Goals:  Date  (Body Area, Impairment Goal, Functional     Activity, Target Performance)  Time Frame  Status  Date/   Initial    03/15/2015  Pt will demo a functional squat with neutral LE's without c/o P! 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    05/10/2015  In progress - updated today 05/16/2015  [facility]   03/15/2015  Pt will go up and down 12, 7 inch stairs with 1 rail and RP without c/o knee pain to ease stair navigation at home and in community daily   05/10/2015  In progress - improved ease with stairs  05/16/2015  [facility]   03/15/2015  Pt will demo hip hinge with neutral trunk and T-rex arms to keep load close to lift 10 lbs from knees to chest without c/o pain to increase safety with lifting for ADL's      05/10/2015  Initial Ev        Patient requires continued skilled care to: mechanical tx of knee/foot, improve NM initiation/strength/endurance; improve ST mobility R hip adductors to decrease tension and allow for return to improving mechanical alignment LE's to return patient to PLOF    Plan:  Continue with Plan of Care   L sided stabilization , PNF patterns    Shawnie Pons [place], [place], DPT [place] # [phone]    05/16/2015

## 2015-05-18 NOTE — PT/OT Plan of Care (Signed)
Plan of Care / Updated Plan of [facility] Medicare Provider #: [phone]    Patient Name: Katherine Freeman MRN: [id number]  DOI: Onset of Problem / Injury: 02/24/15 DOS: N/A  SOC: 05/15/2015   Diagnosis:     ICD-10-CM    1. Chronic pain of both knees M25.561     M25.562     G89.29    2. Chronic bilateral low back pain without sciatica M54.5     G89.29        ASSESSMENT: the patient is a 65 y.o. female presenting with improved/resolved Low back pain and continued bilat knee pain R>L who requires Physical Therapy for the following:  Impairments:   -decreased ROM  -MS tightness  -ms atrophy  -poor foot alignment   -decreased hip strength   -poor core and trunk control       Knees:   Extensive walking  Uneven surfaces and going up and down hills  Stairs ascending > descending   Rolling in bed - worsened in last 6 months or so       Plan Of Care: Body Mechanics Education for reducing strain on ms system and allow for healing, NMR, Proprioceptive/ balance / gait Activites to re-educate position in space, Instruction in HEP in order to support therapeutic treatments and enhance carry over as well as independent care, Therapeutic Exercise, and Soft Tissue/Joint Mobilization to bilat knee, lumbar spine, pelvic girdle    Frequency/Duration: 2 times a week for 4 weeks.Anticipated D/C date: 06/12/2015    Goals:  Date (Body Area, Impairment Goal, Functional   Activity, Target Performance) Time Frame Status Date/  Initial   03/15/2015 Pt will demo a functional squat with neutral LE's without c/o P! To return to squatting & lifting for ADL's daily without limitations 06/12/2015 Progressing, unable to do at this time full range due to pain  VF 05/15/2015    03/15/2015 Pt will report ability to amb with symmetrical gait pattern x 1 hour mins at a time to ease ADL/ work performance daily and return to hiking  05/10/2015 Met  VF 05/15/2015   03/15/2015 Patient will be independent with HEP and tolerate advancement of weight/exercises as needed to support clinical work and transition to self management upon discharge  06/12/2015 Ongoing goal update next time for new equipment  VF 05/15/2015   03/15/2015 Pt will go up and down 12, 7 inch stairs with 1 rail and RP without c/o knee pain to ease stair navigation at home and in community daily 05/10/2015 Met, ease with stairs  VF 05/15/2015   03/15/2015 Pt will demo hip hinge with neutral trunk and T-rex arms to keep load close to lift 10 lbs from knees to chest without c/o pain to increase safety with lifting for ADL's   06/12/2015 Progressing, held at this time 2/2 pain  VF 05/15/2015   05/15/2015 Pt will demonstrate improved balance strategies and decreased risk for falls with SLS or tandem stance on stable and unstable surface > or equal to 15 secs to allow for safe navigation up curbs              06/12/2015       Signature:Posey Jasmin Ilda Foil, PT, DPT [place] # [id number]:05/15/2015    Signature:Zhang, Gertie Baron, MD ____________________________Date:     Patient Name: Katherine Freeman MRN: [id number]

## 2015-05-23 ENCOUNTER — Inpatient Hospital Stay
Payer: No Typology Code available for payment source | Attending: Internal Medicine | Admitting: Rehabilitative and Restorative Service Providers"

## 2015-05-23 DIAGNOSIS — M25561 Pain in right knee: Secondary | ICD-10-CM | POA: Insufficient documentation

## 2015-05-23 DIAGNOSIS — M25562 Pain in left knee: Secondary | ICD-10-CM | POA: Insufficient documentation

## 2015-05-23 DIAGNOSIS — G8929 Other chronic pain: Secondary | ICD-10-CM | POA: Insufficient documentation

## 2015-05-23 NOTE — PT/OT Therapy Note (Signed)
DAILY NOTE   05/23/2015    Time In/Out:  2:41pm - 3:35pm  total Treatment Time: (+15 min vasopneumatic)  Visit Number: 18    POC Expires:  06/12/2015    Payor: Monia Pouch / Plan: [employer] INS[employer]ED / Product Type: *No Product type* /    # of Authorized Visits: 12 Visit #: 3    Diagnosis (Treating/Medical):     ICD-10-CM    1. Chronic pain of both knees M25.561     M25.562     G89.29        Subjective:  Brihany states it's been feeling better since I've been coming here. States i have had some setback. One of them being some swelling in this right leg with unknown mass. i have had an ultrasound and the were not able to detect anything. Since then the mass has decreased in size and pain. States the ice really helped a lot and I have been icing at home as well.      Objective:   Treatment:  Therapeutic Exercise:   DL bridge Z61  March at wall, verbal cues for stability, manual resistance at pelvis for core activation  RC bike, x35mins, hills, level 3.0, seat at 2    NMR:    Prolonged hold L LE ext abd PF pattern   DL bridge, prolonged hold with RS at pelvis, then moved to RS at right LE for pelvic and hip stability  SL balance Right, 4 attempts and left 10+ attempts    Therapeutic Activities:    n/a    Manual Therapy: done bi  Effleurage to R knee for edema  STM to pes anserine, adductor magnus  STM rectus femoris, bilat  Grade 3 inferior patella glide, bilat  STM along lateral quad, ITB              Current Measurements (ROM, Strength, Girth, Outcomes, etc.):     Adduction strength MMT: 5/5 bilaterally    Knee flexion  R: 116 deg limited by swelling     L: 127 deg     SL balance: right LE: 10 sec, left LE 3 sec    Modalities: Vasopneumatic Medium 15 min. Location right knee Position Supine and Elevated bialt LE on wedge  Therapy Rationale: Decrease Pain, Decrease Inflammation, Decrease Edema and Increase Extensiblility      Assessment (response to treatment):  Pt with restrictions at medial knee on the right.  Continued swelling and inflammation along medial thigh and knee. Decreased pain with end range flexion today and some improvements into knee flexion on the right, but is limited by swelling. Added light biking, continue to encourage return to light activities as tolerated. Pt states she used to do bike or elliptical.     Progress towards functional goals: walk up and down steps reciprocally   Goals:  Date   (Body Area, Impairment Goal, Functional     Activity, Target Performance)   Time Frame   Status   Date/   Initial    03/15/2015   Pt will demo a functional squat with neutral LE's without c/o P! 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 and transition to self management upon discharge     06/12/2015   Ongoing goal update next time for new equipment     VF 05/15/2015    03/15/2015   Pt will go up and down 12, 7 inch stairs with 1 rail and RP without c/o knee pain to ease stair navigation at home and in community daily    05/10/2015   Met, ease with stairs     VF 05/15/2015    03/15/2015   Pt will demo hip hinge with neutral trunk and T-rex arms to keep load close to lift 10 lbs from knees to chest without c/o pain to increase safety with lifting for ADL's      06/12/2015   Progressing, held at this time 2/2 pain     VF 05/15/2015    05/15/2015  Pt will demonstrate improved balance strategies and decreased risk for falls with SLS or tandem stance on stable and unstable surface > or equal to 15 secs to allow for safe navigation up curbs  06/12/2015          Patient requires continued skilled care to:  mechanical tx of knee/foot, improve NM initiation/strength/endurance; improve ST mobility R hip adductors to decrease tension and allow for return to improving mechanical alignment LE's to return patient to PLOF    Plan:  Continue with Plan of Care   L sided stabilization , PNF patterns    Loyal Gambler, PT, DPT, ATC  [place] [address]   05/23/2015

## 2015-05-24 ENCOUNTER — Inpatient Hospital Stay: Payer: No Typology Code available for payment source | Admitting: Rehabilitative and Restorative Service Providers"

## 2015-05-26 ENCOUNTER — Inpatient Hospital Stay
Payer: No Typology Code available for payment source | Attending: Internal Medicine | Admitting: Rehabilitative and Restorative Service Providers"

## 2015-05-26 ENCOUNTER — Ambulatory Visit (INDEPENDENT_AMBULATORY_CARE_PROVIDER_SITE_OTHER): Payer: No Typology Code available for payment source | Admitting: Internal Medicine

## 2015-05-26 ENCOUNTER — Encounter (INDEPENDENT_AMBULATORY_CARE_PROVIDER_SITE_OTHER): Payer: Self-pay | Admitting: Internal Medicine

## 2015-05-26 VITALS — BP 124/75 | HR 71 | Temp 97.7°F | Ht 62.52 in | Wt 187.0 lb

## 2015-05-26 DIAGNOSIS — M25562 Pain in left knee: Secondary | ICD-10-CM

## 2015-05-26 DIAGNOSIS — M25561 Pain in right knee: Secondary | ICD-10-CM

## 2015-05-26 DIAGNOSIS — R2241 Localized swelling, mass and lump, right lower limb: Secondary | ICD-10-CM

## 2015-05-26 DIAGNOSIS — M2550 Pain in unspecified joint: Secondary | ICD-10-CM

## 2015-05-26 DIAGNOSIS — G8929 Other chronic pain: Secondary | ICD-10-CM

## 2015-05-26 MED ORDER — TRAMADOL HCL 50 MG PO TABS
50.0000 mg | ORAL_TABLET | Freq: Two times a day (BID) | ORAL | Status: AC | PRN
Start: 2015-05-26 — End: 2015-06-05

## 2015-05-26 NOTE — PT/OT Therapy Note (Signed)
DAILY NOTE   05/26/2015    Time In/Out:  3:00-3:45pm  total Treatment Time:  Visit Number: 18    POC Expires:  06/12/2015    Payor: Monia Pouch / Plan: [employer] INS[employer]ED / Product Type: *No Product type* /    # of Authorized Visits: 12 Visit #: 4    Diagnosis (Treating/Medical):     ICD-10-CM    1. Chronic pain of both knees M25.561     M25.562     G89.29        Subjective:  Katherine Freeman states that she is doing pretty well.  Theres still the issue with swelling but thinks that she manage.  She wants to know what she needs to do to maintain what she has gotten.      Objective:   Treatment:  Therapeutic Exercise:       NMR:    Prolonged hold L LE ext abd PF pattern   DL bridge, prolonged hold with RS at pelvis, then moved to RS at right LE for pelvic and hip stability  -with proper foot position    Therapeutic Activities:    n/a    Manual Therapy: R LE  Distraction of calc/talus  STM of plantar fascia   Medial shear of fat pad  Superior glide of navicular working into DF  Inf/med glide of medial cuneiform blocking navicular  -taped navicular in position                Current Measurements (ROM, Strength, Girth, Outcomes, etc.):     Adduction strength MMT: 5/5 bilaterally    Knee flexion  R: 116 deg limited by swelling     L: 127 deg     SL balance: right LE: 10 sec, left LE 3 sec      Assessment (response to treatment):  L>R feet heavy comp supinated feet with very poor activation and control of intrisinc plantar fascia musculatures.  Her feet position directly causing functional valgus at knee and I think that she will benefit from concentrated treatment on B feet to improve BOS.     Progress towards functional goals: walk up and down steps reciprocally   Goals:  Date   (Body Area, Impairment Goal, Functional     Activity, Target Performance)   Time Frame   Status   Date/   Initial    03/15/2015   Pt will demo a functional squat with neutral LE's without c/o P! To return to squatting & lifting for ADL's daily  without limitations   06/12/2015   Progressing, unable to do at this time full range due to pain    VF 05/15/2015    03/15/2015   Pt will report ability to amb with symmetrical gait pattern  x 1 hour mins at a time to ease ADL/ work  performance daily and return to hiking     05/10/2015   Met     VF 05/15/2015    03/15/2015   Patient will be independent with HEP and tolerate advancement of weight/exercises as needed to support clinical work and transition to self management upon discharge     06/12/2015   Ongoing goal update next time for new equipment     VF 05/15/2015    03/15/2015   Pt will go up and down 12, 7 inch stairs with 1 rail and RP without c/o knee pain to ease stair navigation at home and in community daily    05/10/2015   Met, ease  with stairs     VF 05/15/2015    03/15/2015   Pt will demo hip hinge with neutral trunk and T-rex arms to keep load close to lift 10 lbs from knees to chest without c/o pain to increase safety with lifting for ADL's      06/12/2015   Progressing, held at this time 2/2 pain     VF 05/15/2015    05/15/2015  Pt will demonstrate improved balance strategies and decreased risk for falls with SLS or tandem stance on stable and unstable surface > or equal to 15 secs to allow for safe navigation up curbs  06/12/2015          Patient requires continued skilled care to: mechanical tx of knee/foot, improve NM initiation/strength/endurance; improve ST mobility R hip adductors to decrease tension and allow for return to improving mechanical alignment LE's to return patient to PLOF    Plan:  Continue with Plan of Care   L sided stabilization , PNF patterns    Dorthey Sawyer, PT, DPT [place]# [phone]  05/26/2015

## 2015-05-26 NOTE — Patient Instructions (Signed)
1. Suggest tramadol 50 mg as needed   2. Suggest knee braces and weight loss, movefree    3. Refer general surgery   4. Follow 3 months

## 2015-05-26 NOTE — Progress Notes (Signed)
Initial Rheumatology Consultation    Chief Complaint:     Chief Complaint   Patient presents with   . Initial Consult     [place] results          HPI:   This patient is a 65 y.o. year old female OA of knees, lumbar spondylosis, colonic diverticulosis referred by Lorella Nimrod, MD to evaluate joint pain. She has chronic pain of knees for over 5 yrs (R>L). Her pain is worse with walking. She saw rheumatology Dr. Albin Felling. She was diagnosed OA and had steroid injection of Rt in 8/16. She was given mobic and had lower GI bleeding. She went to ER and was found hematochezia. She takes tylenol as needed.     She has a mass of rt thigh and had [place] on 05/05/15 showed 7.3 x 4.5 CM. Have order MRI. She did PT and feels better.     XR of Rt knee in 7/16 showed mild OA. XR of L-spine in 7/16 showed lumbar spondylosis, multilevel degenerative disc and facet disease, from L3-4 through L5-S1. She has back pain for a month with radiation to her left leg.     She takes tramadol 50 mg as needed and does PT of knees. She has pain and feels a mass of Rt thigh. No h/o injury. She has difficulty walk.     She had history of colonic diverticulosis diagnosed by colonoscopy in 2014. Bleeding scan was negative for active bleeder. EGD showed a polyp in the distal duodenal bulb measuring 1x2 cm which was biopsied. Colonoscopy showed severe colonic diverticulosis.    Denies any fevers, chills or night sweats.  Denies fatigue or weight loss. Denies rash, malar rash or photosensitivity, discoid lesions, psoriasis, nail changes or Raynaud's. Denies hair loss or alopecia. Denies Sicca symptoms, nasal or oral ulcers or epistaxis. Denies cough, shortness of breath, chest pain, palpation or exercise intolerance. Denies nausea, vomiting, diarrhea, melena, hematochezia or dysphagia.  Denies dysuria and hematuria. Denies chronic headaches, paresthesias, focal weakness or seizure activity.      Denies any family history of lupus, rheumatoid arthritis,  connective tissue disease, psoriasis, inflammatory bowel disease, ankylosing spondylitis, multiple sclerosis or other autoimmune diseases.           PMSH:     Past Medical History   Diagnosis Date   . Depression    . Hypertensive disorder    . Hyperlipidemia    . Gastroesophageal reflux disease    . Mitral valve prolapse    . Anxiety    . Arthritis    . STD (sexually transmitted disease)        Past Surgical History   Procedure Laterality Date   . Egd, colonoscopy  01/22/2013     Procedure: EGD, COLONOSCOPY;  Surgeon: Luberta Mutter, MD;  Location: [facility];  Service: Gastroenterology;  Laterality: N/A;  EGD, COLONOSCOPY W/IVA   . Arthroscopy knee, diagnostic       right   . Egd, colonoscopy N/A 01/23/2015     Procedure: EGD, COLONOSCOPY;  Surgeon: Lestine Mount, MD;  Location: [facility];  Service: Gastroenterology;  Laterality: N/A;   . Breast biopsy Left 2010     abnormal mammogram, benign       Allergies:     Allergies   Allergen Reactions   . Adhesive [Wound Dressing Adhesive] Rash       Meds:     Current outpatient prescriptions:   .  hydrochlorothiazide (HYDRODIURIL) 12.5 MG tablet,  Take 1 tablet (12.5 mg total) by mouth nightly., Disp: 90 tablet, Rfl: 3  .  rosuvastatin (CRESTOR) 10 MG tablet, Take 1 tablet (10 mg total) by mouth daily., Disp: 90 tablet, Rfl: 3  .  sertraline (ZOLOFT) 100 MG tablet, Take 1 tablet (100 mg total) by mouth nightly., Disp: 90 tablet, Rfl: 3  .  traMADol (ULTRAM) 50 MG tablet, Take 1 tablet (50 mg total) by mouth 2 (two) times daily as needed., Disp: 60 tablet, Rfl: 2  .  valACYclovir HCL (VALTREX) 500 MG tablet, Take 1 tablet (500 mg total) by mouth daily., Disp: 90 tablet, Rfl: 3    FH:     Family History   Problem Relation Age of Onset   . Heart disease Mother    . Stroke Mother    . Hyperlipidemia Mother    . Heart attack Mother 53     chf, passed away   . Hypertension Mother    . Diabetes Mother    . Cancer Sister 53     lung cancer, smoker   . Cancer Brother  74     cancer of the stomach   . Alcohol abuse Father    . Cancer Other 32     ca of small intestine       SH:     Social History     Social History   . Marital Status: Divorced     Spouse Name: N/A   . Number of Children: 0   . Years of Education: N/A     Occupational History   . RN      [employer]     Social History Main Topics   . Smoking status: Former Smoker -- 0.25 packs/day for 20 years     Quit date: 01/21/1993   . Smokeless tobacco: Never Used      Comment: off and on, no more than 4-5 cigarettes/day   . Alcohol Use: Yes      Comment: 1-2 glasses wine 1-2 times/year   . Drug Use: No   . Sexual Activity:     Partners: Male     Birth Control/ Protection: Post-menopausal     Other Topics Concern   . Not on file     Social History Narrative       ROS:   All other systems reviewed and negative except as described above as HPI.       PHYSICAL EXAM:     Filed Vitals:    05/26/15 1204   BP: 124/75   Pulse: 71   Temp: 97.7 F (36.5 C)       General appearance - alert, well appearing, and in no distress  Mental status - alert, oriented to person, place, and time  Eyes - pupils equal and reactive, extraocular eye movements intact  Ears - bilateral TM's and external ear canals normal  Nose - normal and patent, no erythema, discharge or polyps  Mouth - mucous membranes moist, pharynx normal without lesions  Neck - supple, no significant adenopathy  Lymphatics - no palpable lymphadenopathy, no hepatosplenomegaly  Chest - clear to auscultation, no wheezes, rales or rhonchi, symmetric air entry  Heart - normal rate, regular rhythm, normal S1, S2, no murmurs, rubs, clicks or gallops  Abdomen - soft, nontender, nondistended, no masses or organomegaly  Back exam - full range of motion, no tenderness, palpable spasm or pain on motion  Neurological - alert, oriented, normal speech, no focal findings or movement disorder  noted  Musculoskeletal - Good range of cervical spine.  No crepitus on TMJ auscultation.  No tenderness of MCPs  and PIPs. Good range of motion of shoulders, elbows, wrists and small joints of hands. Pain and may be a mass of Rt thigh. Good range of motion of hips, knees and ankles.  Knees crepitus with range of motion of knees without effusions. Ankles without effusions.  No tenderness of MTPs or effusions.    Extremities - peripheral pulses normal, no pedal edema, no clubbing or cyanosis  Skin - normal coloration and turgor, no rashes, no suspicious skin lesions noted        Labs:         Radiology:     XR of knees on 02/27/15 showed mild degenerative changes of B/L knees.      ASSESSMENT:   Katherine Freeman is a 65 y.o. female OA of knees, lumbar spondylosis, colonic diverticulosis. She has OA of knees. She has a mass of Rt thigh.       PLAN:   1. Suggest tramadol 50 mg as needed   2. Suggest knee braces and weight loss, movefree    3. Refer general surgery   4. Follow 3 months

## 2015-05-26 NOTE — Progress Notes (Signed)
Has the patient sought any care outside of the [facility]? YES    -Refer to care team-   Or  List Specialists:    Yes, listed under Care Team.

## 2015-05-30 ENCOUNTER — Inpatient Hospital Stay
Payer: No Typology Code available for payment source | Attending: Internal Medicine | Admitting: Rehabilitative and Restorative Service Providers"

## 2015-05-30 DIAGNOSIS — M25562 Pain in left knee: Secondary | ICD-10-CM | POA: Insufficient documentation

## 2015-05-30 DIAGNOSIS — G8929 Other chronic pain: Secondary | ICD-10-CM | POA: Insufficient documentation

## 2015-05-30 DIAGNOSIS — M25561 Pain in right knee: Secondary | ICD-10-CM | POA: Insufficient documentation

## 2015-05-30 NOTE — PT/OT Therapy Note (Signed)
DAILY NOTE   05/30/2015    Time In/Out:  1150am -1230pm   total Treatment Time:  Visit Number: 19    POC Expires:  06/12/2015    Payor: Monia Pouch / Plan: [employer] INSURED / Product Type: *No Product type* /    # of Authorized Visits: 12 Visit #: 4    Diagnosis (Treating/Medical):     ICD-10-CM    1. Chronic pain of both knees M25.561     M25.562     G89.29        Subjective:  Katherine Freeman states that she feels pretty good. I kind of wanted today to be my last session. I am a little worried because I don't want to go back to where I started out at. I am struggling with my feet and stay aligned and wear my orthotics all the time now. I think I am ready, with my feet I will always struggle with keeping the right position.      Objective:   Treatment:  Therapeutic Exercise:   ----    NMR:    SLS L LE balance training with MC at foot for stacking and foot engagement with improved balance from 2 sec, 3 sec to 12 sec best trial   SLS with contra supported on step with approximation through pelvis for "rooting" sense  Hip hinge squat at bar for mechanic and training to understand and feel motion that should occur with squatting     Therapeutic Activities:    Seated hip hinge with cues to load weight into LE's with hinge forward and push through LE's to return to sitting for inc weight acceptance into LE's with squatting/transitioning     Lifting training with hip hinge and T-rex lift to promote efficient body mechanics without spinal motion    sit-->stand training at hi/lo table: cues scoot forward to seat edge, get feet under you and hip hinge nose forward over toes keeping spine neutral, then push through quads and squeeze glutes to push up to standing. Reverse cues for return to sit keeping nose forward over toes until able to squat to seat, then sit with control 5 x 10 sec isometric hold in low position                       Manual Therapy: L LE  Distraction of calc/talus  STM of plantar fascia in hooklying    Superior glide of navicular working into DF  Inf/med glide of medial cuneiform blocking navicular  -taped navicular in position and instructed how to perform on own                 Current Measurements (ROM, Strength, Girth, Outcomes, etc.):   Hip abduction bilat 4+/5   Hip adduction bilat 5/5     LEFS: 76% today vs 35% at IE       Knee flexion AAROM  R: 125 deg limited by swelling slight pull across knee        L: 130 deg     SL balance: right LE: 15 sec, L: with cues to build arch and root down through toes 12 sec       Assessment (response to treatment):  Foot position is still a component that will drive residual knee pain if she does not address them soon. Has been making an effort to wear orthotics in all shoes now and understands why this is something we have been stressing. Patient states she  feels ready to take control over her strengthening and to continue progressing as able. She wishes to be discharged at this time. ROM has improved as well as SL balance when we cued her foot engagement on L.. All goals met at this time. Future care may be indicated to address foot and ankle position but at this time patient is ready to be [place] to HEP       Goals:  Date   (Body Area, Impairment Goal, Functional     Activity, Target Performance)   Time Frame   Status   Date/   Initial    03/15/2015   Pt will demo a functional squat with neutral LE's without c/o P! To return to squatting & lifting for ADL's daily without limitations   06/12/2015   Met, x 10 with good form  VF 05/30/2015    03/15/2015   Pt will report ability to amb with symmetrical gait pattern  x 1 hour mins at a time to ease ADL/ work  performance daily and return to hiking     05/10/2015   Met     VF 05/15/2015    03/15/2015   Patient will be independent with HEP and tolerate advancement of weight/exercises as needed to support clinical work and transition to self management upon discharge     06/12/2015   Met, advanced HEP as able VF 05/30/2015   03/15/2015   Pt  will go up and down 12, 7 inch stairs with 1 rail and RP without c/o knee pain to ease stair navigation at home and in community daily    05/10/2015   Met, ease with stairs     VF 05/15/2015    03/15/2015   Pt will demo hip hinge with neutral trunk and T-rex arms to keep load close to lift 10 lbs from knees to chest without c/o pain to increase safety with lifting for ADL's      06/12/2015   Met able to perform today from low to mid height   VF 05/30/2015   05/15/2015  Pt will demonstrate improved balance strategies and decreased risk for falls with SLS or tandem stance on stable and unstable surface > or equal to 15 secs to allow for safe navigation up curbs  06/12/2015   met on R, progressing on L  VF 05/30/2015       Plan:  Discharged from P.T./O.T. Goals Met and Self discharge       Shawnie Pons [place], [place], DPT [place] # [phone]    05/30/2015

## 2015-06-06 ENCOUNTER — Inpatient Hospital Stay: Payer: No Typology Code available for payment source

## 2015-06-07 ENCOUNTER — Inpatient Hospital Stay: Payer: No Typology Code available for payment source | Admitting: Rehabilitative and Restorative Service Providers"

## 2015-06-09 ENCOUNTER — Inpatient Hospital Stay: Payer: No Typology Code available for payment source | Admitting: Rehabilitative and Restorative Service Providers"

## 2015-06-12 ENCOUNTER — Inpatient Hospital Stay: Payer: No Typology Code available for payment source | Admitting: Rehabilitative and Restorative Service Providers"

## 2015-06-14 ENCOUNTER — Inpatient Hospital Stay: Payer: No Typology Code available for payment source | Admitting: Rehabilitative and Restorative Service Providers"

## 2015-06-19 ENCOUNTER — Inpatient Hospital Stay: Payer: No Typology Code available for payment source | Admitting: Rehabilitative and Restorative Service Providers"

## 2015-06-21 ENCOUNTER — Inpatient Hospital Stay: Payer: No Typology Code available for payment source | Admitting: Rehabilitative and Restorative Service Providers"

## 2015-06-26 ENCOUNTER — Inpatient Hospital Stay: Payer: No Typology Code available for payment source | Admitting: Rehabilitative and Restorative Service Providers"

## 2015-06-28 ENCOUNTER — Inpatient Hospital Stay: Payer: No Typology Code available for payment source | Admitting: Rehabilitative and Restorative Service Providers"

## 2015-08-18 ENCOUNTER — Encounter (INDEPENDENT_AMBULATORY_CARE_PROVIDER_SITE_OTHER): Payer: Self-pay | Admitting: Internal Medicine

## 2015-08-18 ENCOUNTER — Ambulatory Visit (INDEPENDENT_AMBULATORY_CARE_PROVIDER_SITE_OTHER): Payer: No Typology Code available for payment source | Admitting: Internal Medicine

## 2015-08-18 VITALS — BP 120/78 | HR 87 | Temp 97.9°F | Ht 62.52 in | Wt 186.0 lb

## 2015-08-18 DIAGNOSIS — M1711 Unilateral primary osteoarthritis, right knee: Secondary | ICD-10-CM

## 2015-08-18 DIAGNOSIS — M5489 Other dorsalgia: Secondary | ICD-10-CM

## 2015-08-18 DIAGNOSIS — M5136 Other intervertebral disc degeneration, lumbar region: Secondary | ICD-10-CM

## 2015-08-18 MED ORDER — TRAMADOL HCL 50 MG PO TABS
50.0000 mg | ORAL_TABLET | Freq: Two times a day (BID) | ORAL | Status: AC | PRN
Start: 2015-08-18 — End: 2015-08-28

## 2015-08-18 NOTE — Progress Notes (Signed)
Initial Rheumatology Consultation    Chief Complaint:     Chief Complaint   Patient presents with   . Follow-up     MRI and [place]          HPI:   This patient is a 65 y.o. year old female OA of knees, lumbar spondylosis, colonic diverticulosis, lower GI bleeding referred by Lorella Nimrod, MD to evaluate joint pain.     She has a mass of rt thigh and had [place] on 05/05/15 showed 7.3 x 4.5 CM. She did not do MRI since her mass resolved after She stoped PT.  She was told possible hematoma. She does exercise at home and feels better.   She has some pain of Rt knee and takes tramadol 50 mg bid as needed.     XR of Rt knee in 7/16 showed mild OA. XR of L-spine in 7/16 showed lumbar spondylosis, multilevel degenerative disc and facet disease, from L3-4 through L5-S1. She has back pain for a month with radiation to her left leg.     She had history of colonic diverticulosis diagnosed by colonoscopy in 2014. Bleeding scan was negative for active bleeder. EGD showed a polyp in the distal duodenal bulb measuring 1x2 cm which was biopsied. Colonoscopy showed severe colonic diverticulosis.    Denies any fevers, chills or night sweats.  Denies fatigue or weight loss. Denies rash, malar rash or photosensitivity, discoid lesions, psoriasis, nail changes or Raynaud's. Denies hair loss or alopecia. Denies Sicca symptoms, nasal or oral ulcers or epistaxis. Denies cough, shortness of breath, chest pain, palpation or exercise intolerance. Denies nausea, vomiting, diarrhea, melena, hematochezia or dysphagia.  Denies dysuria and hematuria. Denies chronic headaches, paresthesias, focal weakness or seizure activity.      Denies any family history of lupus, rheumatoid arthritis, connective tissue disease, psoriasis, inflammatory bowel disease, ankylosing spondylitis, multiple sclerosis or other autoimmune diseases.           PMSH:     Past Medical History   Diagnosis Date   . Depression    . Hypertensive disorder    . Hyperlipidemia    .  Gastroesophageal reflux disease    . Mitral valve prolapse    . Anxiety    . Arthritis    . STD (sexually transmitted disease)        Past Surgical History   Procedure Laterality Date   . Egd, colonoscopy  01/22/2013     Procedure: EGD, COLONOSCOPY;  Surgeon: Luberta Mutter, MD;  Location: [facility] OR;  Service: Gastroenterology;  Laterality: N/A;  EGD, COLONOSCOPY W/IVA   . Arthroscopy knee, diagnostic       right   . Egd, colonoscopy N/A 01/23/2015     Procedure: EGD, COLONOSCOPY;  Surgeon: Lestine Mount, MD;  Location: [id number];  Service: Gastroenterology;  Laterality: N/A;   . Breast biopsy Left 2010     abnormal mammogram, benign       Allergies:     Allergies   Allergen Reactions   . Adhesive [Wound Dressing Adhesive] Rash       Meds:     Current outpatient prescriptions:   .  hydrochlorothiazide (HYDRODIURIL) 12.5 MG tablet, Take 1 tablet (12.5 mg total) by mouth nightly., Disp: 90 tablet, Rfl: 3  .  rosuvastatin (CRESTOR) 10 MG tablet, Take 1 tablet (10 mg total) by mouth daily., Disp: 90 tablet, Rfl: 3  .  sertraline (ZOLOFT) 100 MG tablet, Take 1 tablet (100 mg total) by  mouth nightly., Disp: 90 tablet, Rfl: 3  .  valACYclovir HCL (VALTREX) 500 MG tablet, Take 1 tablet (500 mg total) by mouth daily., Disp: 90 tablet, Rfl: 3    FH:     Family History   Problem Relation Age of Onset   . Heart disease Mother    . Stroke Mother    . Hyperlipidemia Mother    . Heart attack Mother 25     chf, passed away   . Hypertension Mother    . Diabetes Mother    . Cancer Sister 28     lung cancer, smoker   . Cancer Brother 13     cancer of the stomach   . Alcohol abuse Father    . Cancer Other 32     ca of small intestine       SH:     Social History     Social History   . Marital Status: Divorced     Spouse Name: N/A   . Number of Children: 0   . Years of Education: N/A     Occupational History   . RN      [employer]     Social History Main Topics   . Smoking status: Former Smoker -- 0.25 packs/day for 20 years      Quit date: 01/21/1993   . Smokeless tobacco: Never Used      Comment: off and on, no more than 4-5 cigarettes/day   . Alcohol Use: Yes      Comment: 1-2 glasses wine 1-2 times/year   . Drug Use: No   . Sexual Activity:     Partners: Male     Birth Control/ Protection: Post-menopausal     Other Topics Concern   . Not on file     Social History Narrative       ROS:   All other systems reviewed and negative except as described above as HPI.       PHYSICAL EXAM:     Filed Vitals:    08/18/15 1049   BP: 120/78   Pulse: 87   Temp: 97.9 F (36.6 C)       General appearance - alert, well appearing, and in no distress  Mental status - alert, oriented to person, place, and time  Eyes - pupils equal and reactive, extraocular eye movements intact  Ears - bilateral TM's and external ear canals normal  Nose - normal and patent, no erythema, discharge or polyps  Mouth - mucous membranes moist, pharynx normal without lesions  Neck - supple, no significant adenopathy  Lymphatics - no palpable lymphadenopathy, no hepatosplenomegaly  Chest - clear to auscultation, no wheezes, rales or rhonchi, symmetric air entry  Heart - normal rate, regular rhythm, normal S1, S2, no murmurs, rubs, clicks or gallops  Abdomen - soft, nontender, nondistended, no masses or organomegaly  Back exam - full range of motion, no tenderness, palpable spasm or pain on motion  Neurological - alert, oriented, normal speech, no focal findings or movement disorder noted  Musculoskeletal - Good range of cervical spine.  No crepitus on TMJ auscultation.  No tenderness of MCPs and PIPs. Good range of motion of shoulders, elbows, wrists and small joints of hands. Good range of motion of hips, knees and ankles.  Knees crepitus with range of motion of knees without effusions. Ankles without effusions.  No tenderness of MTPs or effusions.    Extremities - peripheral pulses normal, no pedal edema, no clubbing  or cyanosis  Skin - normal coloration and turgor, no  rashes, no suspicious skin lesions noted        Labs:         Radiology:     XR of knees on 02/27/15 showed mild degenerative changes of B/L knees.      ASSESSMENT:   Katherine Freeman is a 65 y.o. female OA of knees, lumbar spondylosis, colonic diverticulosis. She has OA of knees. She has pain of Rt knee and wants to have steroid injection.       PLAN:   1. Suggest tramadol 50 mg as needed   2. Suggest knee braces and weight loss, movefree    3. Rt knee pain:  kenalog 40 mg with 1 ml of lidocaine  4. Follow 3 months       Bursitis steroid injection    Consent: Written consent not obtained.  Risks and benefits: risks, benefits and alternatives were discussed  Consent given by: patient  Patient understanding: patient states understanding of the procedure being performed  Patient identity confirmed: verbally with patient  Indications: pain   Sterile: ChloraPrep  Body area: Rt knee   Local anesthesia used: yes  Patient sedated: no  Preparation: patient was prepped and draped in the usual sterile fasion  Needle gauge: 23  Meds administered: kenalog 40 mg with 1 ml of lidocaine  Patient tolerance: patient tolerated the procedure well with no immediate complications.  Comments: no

## 2015-08-18 NOTE — Progress Notes (Signed)
Has the patient sought any care outside of the [facility]? NO    -Refer to care team-   Or  List Specialists:

## 2015-09-04 ENCOUNTER — Other Ambulatory Visit: Payer: Self-pay

## 2015-09-04 MED ORDER — TRAMADOL HCL 50 MG PO TABS
50.0000 mg | ORAL_TABLET | Freq: Two times a day (BID) | ORAL | 2 refills | Status: DC | PRN
Start: 2015-09-04 — End: 2015-12-25
  Filled 2015-09-04: qty 60, 30d supply, fill #0

## 2015-12-25 ENCOUNTER — Ambulatory Visit (INDEPENDENT_AMBULATORY_CARE_PROVIDER_SITE_OTHER): Payer: No Typology Code available for payment source | Admitting: Internal Medicine

## 2015-12-25 ENCOUNTER — Encounter (INDEPENDENT_AMBULATORY_CARE_PROVIDER_SITE_OTHER): Payer: Self-pay | Admitting: Internal Medicine

## 2015-12-25 VITALS — BP 119/77 | HR 73 | Temp 97.8°F | Ht 62.25 in | Wt 194.8 lb

## 2015-12-25 DIAGNOSIS — Z0000 Encounter for general adult medical examination without abnormal findings: Secondary | ICD-10-CM

## 2015-12-25 DIAGNOSIS — F32A Depression, unspecified: Secondary | ICD-10-CM

## 2015-12-25 DIAGNOSIS — Z9621 Cochlear implant status: Secondary | ICD-10-CM

## 2015-12-25 DIAGNOSIS — Z23 Encounter for immunization: Secondary | ICD-10-CM

## 2015-12-25 DIAGNOSIS — F329 Major depressive disorder, single episode, unspecified: Secondary | ICD-10-CM

## 2015-12-25 DIAGNOSIS — E784 Other hyperlipidemia: Secondary | ICD-10-CM

## 2015-12-25 DIAGNOSIS — A6000 Herpesviral infection of urogenital system, unspecified: Secondary | ICD-10-CM

## 2015-12-25 DIAGNOSIS — E7849 Other hyperlipidemia: Secondary | ICD-10-CM

## 2015-12-25 DIAGNOSIS — I10 Essential (primary) hypertension: Secondary | ICD-10-CM

## 2015-12-25 DIAGNOSIS — D509 Iron deficiency anemia, unspecified: Secondary | ICD-10-CM

## 2015-12-25 LAB — CBC
Hematocrit: 37 % (ref 37.0–47.0)
Hgb: 11.2 g/dL — ABNORMAL LOW (ref 12.0–16.0)
MCH: 28.8 pg (ref 28.0–32.0)
MCHC: 30.3 g/dL — ABNORMAL LOW (ref 32.0–36.0)
MCV: 95.1 fL (ref 80.0–100.0)
MPV: 10.7 fL (ref 9.4–12.3)
Nucleated RBC: 0 /100 WBC (ref 0.0–1.0)
Platelets: 282 10*3/uL (ref 140–400)
RBC: 3.89 10*6/uL — ABNORMAL LOW (ref 4.20–5.40)
RDW: 17 % — ABNORMAL HIGH (ref 12–15)
WBC: 7.3 10*3/uL (ref 3.50–10.80)

## 2015-12-25 LAB — COMPREHENSIVE METABOLIC PANEL
ALT: 12 U/L (ref 0–55)
AST (SGOT): 18 U/L (ref 5–34)
Albumin/Globulin Ratio: 0.9 (ref 0.9–2.2)
Albumin: 3.6 g/dL (ref 3.5–5.0)
Alkaline Phosphatase: 81 U/L (ref 37–106)
BUN: 16 mg/dL (ref 7.0–19.0)
Bilirubin, Total: 0.3 mg/dL (ref 0.1–1.2)
CO2: 28 mEq/L (ref 21–30)
Calcium: 9.9 mg/dL (ref 8.5–10.5)
Chloride: 112 mEq/L — ABNORMAL HIGH (ref 100–111)
Creatinine: 0.9 mg/dL (ref 0.4–1.5)
Globulin: 3.8 g/dL — ABNORMAL HIGH (ref 2.0–3.7)
Glucose: 91 mg/dL (ref 70–100)
Potassium: 5.2 mEq/L — ABNORMAL HIGH (ref 3.5–5.1)
Protein, Total: 7.4 g/dL (ref 6.0–8.3)
Sodium: 145 mEq/L (ref 135–146)

## 2015-12-25 LAB — IRON PROFILE
Iron Saturation: 15 % (ref 15–50)
Iron: 57 ug/dL (ref 40–145)
TIBC: 390 ug/dL (ref 265–497)
UIBC: 333 ug/dL (ref 126–382)

## 2015-12-25 LAB — URINALYSIS WITH MICROSCOPIC
Bilirubin, UA: NEGATIVE
Blood, UA: NEGATIVE
Glucose, UA: NEGATIVE
Ketones UA: NEGATIVE
Nitrite, UA: NEGATIVE
Protein, UR: NEGATIVE
Specific Gravity UA: 1.025 (ref 1.001–1.035)
Urine pH: 6 (ref 5.0–8.0)
Urobilinogen, UA: 0.2 (ref 0.2–2.0)

## 2015-12-25 LAB — GFR: EGFR: >60

## 2015-12-25 LAB — FERRITIN: Ferritin: 8.06 ng/mL (ref 4.60–204.00)

## 2015-12-25 LAB — LIPID PANEL
CHOLESTEROL: 199 (ref 0–200)
Cholesterol / HDL Ratio: 2.6
Cholesterol: 199 mg/dL (ref 0–199)
HDL: 77 mg/dL (ref 40–9999)
HDL: 77 — AB (ref 35–70)
LDL CALC: 106
LDL Calculated: 106 mg/dL — ABNORMAL HIGH (ref 0–99)
TRIGLYCERIDES: 78 (ref 40–160)
Triglycerides: 78 mg/dL (ref 34–149)
VLDL Calculated: 16 mg/dL (ref 10–40)

## 2015-12-25 LAB — HEPATITIS C ANTIBODY: Hepatitis C, AB: NONREACTIVE

## 2015-12-25 LAB — HEMOGLOBIN A1C
Average Estimated Glucose: 114 mg/dL
HEMOGLOBIN A1C: 5.6
Hemoglobin A1C: 5.6 % (ref 4.6–5.9)

## 2015-12-25 LAB — HEMOLYSIS INDEX: Hemolysis Index: 2 (ref 0–18)

## 2015-12-25 LAB — TSH: TSH: 1.35 u[IU]/mL (ref 0.35–4.94)

## 2015-12-25 LAB — HIV AG/AB 4TH GENERATION: HIV Ag/Ab, 4th Generation: NONREACTIVE

## 2015-12-25 LAB — HM HEPATITIS C SCREENING LAB: HM HEPATITIS C SCREENING: NEGATIVE

## 2015-12-25 LAB — HEPATIC FUNCTION PANEL
ALT: 12 (ref 7–35)
AST: 18 (ref 13–35)
Alkaline Phosphatase: 81 (ref 25–125)
BILIRUBIN, TOTAL: 0.3

## 2015-12-25 LAB — BASIC METABOLIC PANEL
BUN: 16 (ref 4–21)
Creatinine: 0.9 (ref 0.5–1.1)
Glucose: 91
Potassium: 5.2 (ref 3.4–5.3)
SODIUM: 145 (ref 137–147)

## 2015-12-25 LAB — CBC AND DIFFERENTIAL
HEMATOCRIT: 37 (ref 36–46)
Hemoglobin: 11.2 — AB (ref 12.0–16.0)
PLATELETS: 282 (ref 150–399)
WBC: 7.3

## 2015-12-25 LAB — HM HIV SCREENING LAB: HM HIV SCREENING: NEGATIVE

## 2015-12-25 MED ORDER — ROSUVASTATIN CALCIUM 10 MG PO TABS
10.0000 mg | ORAL_TABLET | Freq: Every day | ORAL | Status: AC
Start: 2015-12-25 — End: ?

## 2015-12-25 MED ORDER — SERTRALINE HCL 100 MG PO TABS
100.0000 mg | ORAL_TABLET | Freq: Every day | ORAL | Status: AC
Start: 2015-12-25 — End: ?

## 2015-12-25 MED ORDER — HYDROCHLOROTHIAZIDE 12.5 MG PO TABS
12.5000 mg | ORAL_TABLET | Freq: Every day | ORAL | Status: DC
Start: 2015-12-25 — End: 2016-07-08

## 2015-12-25 MED ORDER — VALACYCLOVIR HCL 500 MG PO TABS
1000.0000 mg | ORAL_TABLET | Freq: Three times a day (TID) | ORAL | Status: AC
Start: 2015-12-25 — End: 2016-03-24

## 2015-12-25 NOTE — Patient Instructions (Addendum)
Valtrex  - take 1 g twice daily for 10 days to treat "active disease"  - then stop taking it altogether.  - if you think it's coming back, take 1,000 mg once daily for 5 days to treat "recurrent disease" then followed by 500 mg once daily (for life) for "suppression therapy"    -----------------------------------------------------------------------------------------------------    You had your annual physical today.   - You had the following labs drawn today: CBC, CMP, TSH, fasting lipid panel, A1c, HIV, hep C, UA, iron panel & ferritin. The results will be available on MyChart in the next 1-2 days.  I will call you if there's anything urgent.  - your next mammogram is due in December 2017.  - your next Pap smear and HPV test is due after you turn 65 years old.    - your next colonoscopy is due in 2021    If are searching the internet for medical information, I would recommend that you go to the  [facility] website at TanExchange.nl.  Primary care doctors also like the [employer] (USPSTF) as their recommendations are all rooted in objective evidence of medical benefit to the patient.  You can check out USPSTF's recommendations on their website at http://epss.PlumberMagazine.com.ee.      General Recommendations for staying healthy and preventing progressive disease:  1. Repeat health assessment every 1-2 years.  2. Get a vision and dental exam on a regular bases. (Note: diabetic patients should have an eye exam every year).  3. exercise for at least 30 minutes daily, at least five times per week.  4. Avoid high fat, high calorie diet, eat more cold cuts, fresh vegetables, fruits and low salt diet. More about healthy eating below.    5. Do not smoke cigars or cigarettes.   6. Do not chew any tobacco products  7. No e-cigarettes either!  8. Do not drink more than 1 alcoholic beverages in any given 24 hour period.  9. Always wear your seatbelt while driving  10. Use a Sunscreen with SPF  of at least 25 or greater.  11. Examine your skin on a regular bases and if you notice any concerning changes (see ABCDE below)  please let us know.  12. Protect your bones from osteoporosis by doing weight bearing exercises on a regular basis and taking 1200 mg of calcium and 800 vitamin D daily.  13. Get vaccinated!   Get the flu vaccine each fall    Get the tetanus vaccine every 10 years.   After you turn 64 years old, 2 pneumonia vaccines (Pneumovax23 & [phone]) are recommended.  They are also recommended for some patients younger than 80 with certain medical conditions.    Shingles vaccine starting at age 85.          ABCDE of skin evaluation for moles or melanoma.    Look for :  A: Asymmetric lesion  B: Border of lesion changes  C: Color of skin lesion changes  D: Diameter of skin lesion changes   E: Evolution of mole    If you notice any of above changes then please let us know.      Your weight and blood pressures from the last 3 visits:  Wt Readings from Last 3 Encounters:   12/25/15 88.361 kg (194 lb 12.8 oz)   08/18/15 84.369 kg (186 lb)   05/26/15 84.823 kg (187 lb)           BP Readings from  Last 3 Encounters:   12/25/15 119/77   08/18/15 120/78   05/26/15 124/75       Ideal BMI is 18-25 kg/m2.  Your Body mass index is 35.35 kg/(m^2).  Please make an appointment with me if you would like help in losing weight.          Eating Healthy  Changing the way you eat can reduce many of your risk factors. It can lower your cholesterol, blood pressure, and weight. Your diet doesn't have to be bland and boring to be healthy. Just follow these 3 steps: eat less fat and salt, and eat more fiber. Your whole family can benefit from healthy eating habits.    1. Eat Less Fat   Eat fewer fatty cuts of meat and more fish.   Avoid butter and lard, and use less margarine.   Avoid foods containing palm, coconut, or hydrogenated oils.   Eat fewer high-fat dairy products like cheese, ice cream, and whole  milk.   Get a heart-healthy cookbook and try some low-fat recipes.   2.Eat Less Salt   Don't add salt to food when cooking, and keep the saltshaker off the table.   Don't use high-salt ingredients such as MSG, soy sauce, baking soda, and baking powder.   Instead of salt, season your food with herbs and flavorings such as lemon, garlic, and onion.   3. EatMore Fiber   Eat fresh fruits and vegetables.   Add oats, whole-grain rice, and bran to your diet.   Beans are excellent sources of fiber.   When you eat more fiber, be sure to drink more water to prevent constipation.      [address] [place], [place] [address]. All rights reserved. This information is not intended as a substitute for professional medical care. Always follow your healthcare professional's instructions.      If you need to contact me please e-mail me or Dr. Shelva Majestic via MyChart.

## 2015-12-25 NOTE — Progress Notes (Signed)
Subjective:      Date: 12/25/2015 6:11 PM   Patient ID: Katherine Freeman is a 65 y.o. female.    Chief Complaint:  Chief Complaint   Patient presents with   . 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    Dental: regular dental visits twice a year  Vision: glasses  Hearing: normal hearing  Immunization Status: Pneumococcal vaccination due  Reproductive Health: not sexually active and postmenopausal  Prior Screening Tests: colonoscopy within past 5 years, pap smear within past 3-5 years and mammogram 04/2015.  General Health Risks: family history of colon cancer (brother had some form of GI CA) and no family history of breast cancer  Safety Elements Used: uses seat belts, smoke detectors in household, carbon monoxide detectors in household, sunscreen use and no guns at home    Problem List:  Patient Active Problem List   Diagnosis   . Hyperlipidemia   . Hypertensive disorder   . Herpes, genital   . Depression   . Anxiety   . Lower GI bleed   . Acute blood loss anemia   . Establishing care with new doctor, encounter for   . Primary osteoarthritis of knee   . Chronic knee pain, unspecified laterality   . Arthralgia, unspecified joint   . Other back pain   . Pain of right thigh   . Mass of thigh, right   . Primary osteoarthritis of right knee   . Degenerative disc disease, lumbar       Current Medications:  Current Outpatient Prescriptions   Medication Sig Dispense Refill   . hydroCHLOROthiazide (HYDRODIURIL) 12.5 MG tablet Take 1 tablet (12.5 mg total) by mouth daily. 90 tablet 3   . rosuvastatin (CRESTOR) 10 MG tablet Take 1 tablet (10 mg total) by mouth daily. 90 tablet 3   . sertraline (ZOLOFT) 100 MG tablet Take 1 tablet (100 mg  total) by mouth daily. 90 tablet 3   . [DISCONTINUED] rosuvastatin (CRESTOR) 10 MG tablet Take 1 tablet (10 mg total) by mouth daily. 90 tablet 3   . valACYclovir HCL (VALTREX) 500 MG tablet Take 2 tablets (1,000 mg total) by mouth 3 (three) times daily. 540 tablet 0     No current facility-administered medications for this visit.       Allergies:  Allergies   Allergen Reactions   . Adhesive [Wound Dressing Adhesive] Rash       Past Medical History:  Past Medical History   Diagnosis Date   . Depression    . Hypertensive disorder    . Hyperlipidemia    . Gastroesophageal reflux disease    . Mitral valve prolapse    . Anxiety    . Arthritis    . HSV (herpes simplex virus) infection 2015     recurrent genital herpes       Past Surgical History:  Past Surgical History   Procedure Laterality Date   . Egd, colonoscopy  01/22/2013     Procedure: EGD, COLONOSCOPY;  Surgeon: Luberta Mutter, MD;  Location: [facility] OR;  Service: Gastroenterology;  Laterality: N/A;  EGD, COLONOSCOPY W/IVA   . Arthroscopy knee, diagnostic  right   . Egd, colonoscopy N/A 01/23/2015     Procedure: EGD, COLONOSCOPY;  Surgeon: Lestine Mount, MD;  Location: [facility];  Service: Gastroenterology;  Laterality: N/A;   . Breast biopsy Left 2010     abnormal mammogram, benign       Family History:  Family History   Problem Relation Age of Onset   . Heart disease Mother    . Stroke Mother    . Hyperlipidemia Mother    . Heart attack Mother 61     chf, passed away   . Hypertension Mother    . Diabetes Mother    . Cancer Sister 26     lung cancer, smoker   . Cancer Brother 23     cancer of the stomach   . Alcohol abuse Father    . Cancer Other 32     ca of small intestine       Social History:  Social History     Social History   . Marital Status: Divorced     Spouse Name: N/A   . Number of Children: 0   . Years of Education: N/A     Occupational History   . RN      [employer]     Social History Main Topics   . Smoking status: Former Smoker --  0.25 packs/day for 20 years     Quit date: 01/21/1993   . Smokeless tobacco: Never Used      Comment: off and on, no more than 4-5 cigarettes/day   . Alcohol Use: Yes      Comment: 1-2 glasses wine 1-2 times/year   . Drug Use: No   . Sexual Activity:     Partners: Male     Birth Control/ Protection: Post-menopausal     Other Topics Concern   . Not on file     Social History Narrative       The following portions of the patient's history were reviewed and updated as appropriate: allergies, current medications, past family history, past medical history, past social history, past surgical history and problem list.      ROS:   General/Constitutional:   Denies Change in appetite. Denies Chills. Denies Fatigue. Denies Fever.   Ophthalmologic:   Denies Blurred vision. Denies Eye Discharge. Denies Eye Pain.   ENT:   Denies Nasal Discharge. Denies Hoarseness. Denies Ear pain. Denies Nosebleed.    Denies Sinus pain. Denies Sore throat.   Endocrine:   Denies Decreased Libido. Denies Polydipsia. Denies Polyuria. Denies Weakness.   Respiratory:   Denies Paroxysmal Nocturnal Dyspnea. Denies Cough. Denies Orthopnea. Denies    \Shortness of breath. Denies Daytime Hypersomnolence. Denies Snoring. Denies    Witness Apnea. Denies Wheezing.   Cardiovascular:   Denies Chest pain. Denies Chest pain with exertion. Denies Leg Claudication. Denies    Palpitations. Denies Swelling in hands/feet.   Gastrointestinal:   Denies Abdominal pain. Denies Blood in stool. Denies Constipation. Denies Diarrhea.    Denies Heartburn. Denies Nausea. Denies Vomiting.   Hematology:   Denies Easy bruising. Denies Easy Bleeding. Denies Swollen glands.   Genitourinary:   Denies Blood in urine. Denies Nocturia. Denies Painful urination.   Musculoskeletal:   Denies Joint pain. Denies Joint stiffness. Denies Leg cramps. Denies Muscle aches.    Denies Weakness in LE. Denies Swollen joints. Denies Weakness in UE.   Skin:   Denies Itching. Denies Change in Mole(s).  Denies Rash.   Neurologic:   Denies Balance  difficulty. Denies Dizziness. Denies Gait abnormality. Denies  Headache. Denies Pre-Syncope. Denies Memory loss. Denies Seizures. Denies    Tingling/Numbness.   Psychiatric:   Denies Anxiety. Denies Depressed mood. Denies Difficulty sleeping.       Objective:     Vitals:  BP 119/77 mmHg  Pulse 73  Temp(Src) 97.8 F (36.6 C) (Oral)  Ht 1.581 m (5' 2.25")  Wt 88.361 kg (194 lb 12.8 oz)  BMI 35.35 kg/m2  SpO2 97%    Examination:   GENERAL APPEARANCE: alert, in no acute distress, well developed, well nourished, oriented to time, place, and person.   HEAD: normal appearance, atraumatic.   EYES: extraocular movement intact (EOMI), pupils round and equal; sclera anicteric, conjunctiva clear.   EARS: tympanic membranes normal bilaterally, external canals normal .   NOSE: normal nasal mucosa, no lesions.   ORAL CAVITY: normal oropharynx, normal lips, mucosa moist, no lesions.   THROAT: normal appearance, clear, no erythema.   NECK/THYROID: neck supple, no carotid bruit, carotid pulse 2+ bilaterally, no cervical    lymphadenopathy, no neck mass palpated, no jugular venous distention, no thyromegaly.   LYMPH NODES: no palpable adenopathy.   SKIN: good turgor, no rashes, no suspicious lesions.   HEART: S1, S2 normal, no murmurs, rubs, gallops, regular rate and rhythm.   LUNGS: normal effort / no distress, normal breath sounds, clear to auscultation bilaterally, no wheezes, rales, rhonchi.   ABDOMEN: bowel sounds present, no hepatosplenomegaly, soft, nontender, nondistended.   MUSCULOSKELETAL: full range of motion, no swelling or deformity.   EXTREMITIES: no edema, no clubbing, cyanosis, or edema.   PERIPHERAL PULSES: 2+ dorsalis pedis, 2+ posterior tibial.   NEUROLOGIC: nonfocal, cranial nerves 2-12 grossly intact, deep tendon reflexes 2+ symmetrical, normal strength, tone and reflexes, sensory exam intact.   PSYCH: cognitive function intact, mood/affect full range, speech  clear.       Assessment/Plan:       1. Annual physical exam  - CBC without differential  - Comprehensive metabolic panel  - Lipid panel  - Hemoglobin A1C  - Hepatitis C (HCV) antibody, Total  - HIV Ag/Ab 4th generation  - Urinalysis with microscopic    2. Iron deficiency anemia, 2/2 GIB that was likely diverticular in origin.  (required hospitalization 9/15-9/20/2016)  - IRON PROFILE  - Ferritin    3. Depression, well controlled on current regime  - TSH  - sertraline (ZOLOFT) 100 MG tablet; Take 1 tablet (100 mg total) by mouth daily.  Dispense: 90 tablet; Refill: 3    4. HLD  - rosuvastatin (CRESTOR) 10 MG tablet; Take 1 tablet (10 mg total) by mouth daily.  Dispense: 90 tablet; Refill: 3    5. Need for prophylactic vaccination with Streptococcus pneumoniae (Pneumococcus) and Influenza vaccines  - Pneumococcal conjugate vaccine 13-valent less than 5yo IM    6. Genital herpes simplex   - valACYclovir HCL (VALTREX) 500 MG tablet; Take 2 tablets (1,000 mg total) by mouth 3 (three) times daily.  Dispense: 540 tablet; Refill: 0  - patient to complete 10 days of full dose therapy and then to stop.  If recurrence, pt to complete recurrent dz tx and then resume suppressive therapy     Health Maintenance:  - 2016-2017 lab results reviewed with pt  - Immunizations UTD. Vision screening UTD. Dental Screening UTD Colonoscopy is UTD Mammogram screening is UTD Cervical cancer screening is UTD  - mammogram due 04/2016  - screening colonoscopy q5 years 2/2 +family hx; next due 2021  Signed by,  Garnetta Buddy, MD  Internal Medicine, PGY-3    Patient seen and examined with Dr. Shelva Majestic.  Assessment and plan reviewed by Dr. Shelva Majestic.    CC: Dr. Shelva Majestic

## 2015-12-25 NOTE — Progress Notes (Signed)
Have you seen any specialists/other providers since your last visit with [place]?    Yes care team updated      Limb alert protocol reviewed?      Yes no restrictions    Patient here for physical and is fasting for BW. Mammo referral     Patient presented to the office for prevnar 13 administration.  Received injection in the Left arm.  No reaction was noted and patient left in good condition.

## 2015-12-25 NOTE — Progress Notes (Signed)
Attending Physician Attestation:    I was present during or personally performed key portions of the service documented above by Dr. Wang, the resident physician. I discussed with the resident, and was directly involved in the management of the patient. I have made modifications to the note as necessary.     Margaret Cockerill C Deovion Batrez, MD, FACP

## 2015-12-26 ENCOUNTER — Encounter: Payer: Self-pay | Admitting: Internal Medicine

## 2015-12-26 NOTE — Progress Notes (Addendum)
Quick Note:    CBC improved 9.6 --> 11.2 (MCV 95, RDW 17), CMP wnl and with normal fasting glucose, mildly elevated K 5.2, TSH wnl, fasting lipid panel with LDL 106 (on Crestor) & 10 year ASCVD 6.5%. A1c 5.6. HIV & Hep C neg. UA - no protein. Iron panel wnl, ferritin on lower end (<10).  ______

## 2015-12-29 ENCOUNTER — Telehealth: Payer: Self-pay | Admitting: Internal Medicine

## 2015-12-29 MED ORDER — IRON-VITAMIN C 65-125 MG PO TABS
1.0000 | ORAL_TABLET | Freq: Three times a day (TID) | ORAL | Status: AC
Start: 2015-12-29 — End: 2016-03-28

## 2015-12-29 NOTE — Telephone Encounter (Signed)
Spoke with Ms. Standard over the phone about low iron stores (ferritin of 8).  Recommended that she take vitron-C TID AC for a month.      The side effects of iron are discussed, primarily GI in type, such as cramping, constipation, black stools.       Lab Results   Component Value Date    IRON 57 12/25/2015    TIBC 390 12/25/2015    FERRITIN 8.06 12/25/2015

## 2016-03-19 ENCOUNTER — Telehealth (INDEPENDENT_AMBULATORY_CARE_PROVIDER_SITE_OTHER): Payer: Self-pay | Admitting: Internal Medicine

## 2016-03-19 DIAGNOSIS — Z1239 Encounter for other screening for malignant neoplasm of breast: Secondary | ICD-10-CM

## 2016-03-19 NOTE — Telephone Encounter (Signed)
Patient needs a screening mammogram order.

## 2016-03-21 NOTE — Addendum Note (Signed)
Addended by: Lorella Nimrod on: 03/21/2016 06:33 PM     Modules accepted: Orders

## 2016-03-21 NOTE — Telephone Encounter (Signed)
ordered

## 2016-04-26 ENCOUNTER — Other Ambulatory Visit: Payer: Self-pay | Admitting: Internal Medicine

## 2016-04-26 ENCOUNTER — Other Ambulatory Visit (INDEPENDENT_AMBULATORY_CARE_PROVIDER_SITE_OTHER): Payer: Self-pay | Admitting: Internal Medicine

## 2016-04-26 DIAGNOSIS — F32A Depression, unspecified: Secondary | ICD-10-CM

## 2016-04-26 DIAGNOSIS — E7849 Other hyperlipidemia: Secondary | ICD-10-CM

## 2016-05-09 ENCOUNTER — Other Ambulatory Visit (INDEPENDENT_AMBULATORY_CARE_PROVIDER_SITE_OTHER): Payer: Self-pay | Admitting: Internal Medicine

## 2016-05-09 NOTE — Telephone Encounter (Signed)
Called to inform patient regarding refills on mediations , patient has 2 refills left on 3 medications by mail order. Patient states that will contact [facility] mail order pharmacy.

## 2016-05-25 ENCOUNTER — Other Ambulatory Visit: Payer: Self-pay

## 2016-05-25 MED ORDER — AMOXICILLIN 500 MG PO CAPS
2000.0000 mg | ORAL_CAPSULE | ORAL | 0 refills | Status: AC
Start: 2016-05-25 — End: ?
  Filled 2016-05-25: qty 16, 4d supply, fill #0

## 2016-05-28 ENCOUNTER — Encounter (INDEPENDENT_AMBULATORY_CARE_PROVIDER_SITE_OTHER): Payer: Self-pay | Admitting: Internal Medicine

## 2016-06-28 ENCOUNTER — Telehealth (INDEPENDENT_AMBULATORY_CARE_PROVIDER_SITE_OTHER): Payer: Self-pay | Admitting: Internal Medicine

## 2016-06-28 NOTE — Telephone Encounter (Signed)
Good morning Dr Shelva Majestic    Katherine Freeman called to inform you about her elevated BP and she is in [place]. She states that she checked her BP this morning and it was 170/104. She has been prescribed HCTZ 25 mg and then it was reduced to 12.5 mg by her previous physician; and was told to stop taking it because her BP was low before she started seeing you    The last couple of days she says that it has been elevated and she started back taking the HCTZ 25 mg. She has scheduled an appt with you for next Tuesday, but at this time she would like to speak with you about what she should do until she comes in. She can be reached [phone]      Thanks    Claudie Leach

## 2016-07-02 ENCOUNTER — Ambulatory Visit (INDEPENDENT_AMBULATORY_CARE_PROVIDER_SITE_OTHER): Payer: No Typology Code available for payment source | Admitting: Internal Medicine

## 2016-07-02 NOTE — Telephone Encounter (Signed)
Spoke pt. This am's bp reading was 120/85.  Had a ha at the highest readings a few days ago. Right eye became bloodshot.   Suspect it went up with travel. Pt will monitor sodium intake. Track blood pressure readings and continue HCTZ 25 mg for now. Has appt Monday.  Warning signs such as chest pain, dizziness, shortness of breath, marked vision change, nerve/muscle issues would be an indication to go to the er.

## 2016-07-04 ENCOUNTER — Ambulatory Visit (INDEPENDENT_AMBULATORY_CARE_PROVIDER_SITE_OTHER): Payer: No Typology Code available for payment source | Admitting: Internal Medicine

## 2016-07-08 ENCOUNTER — Ambulatory Visit (INDEPENDENT_AMBULATORY_CARE_PROVIDER_SITE_OTHER): Payer: No Typology Code available for payment source | Admitting: Internal Medicine

## 2016-07-08 ENCOUNTER — Encounter (INDEPENDENT_AMBULATORY_CARE_PROVIDER_SITE_OTHER): Payer: Self-pay | Admitting: Internal Medicine

## 2016-07-08 VITALS — BP 137/79 | HR 66 | Temp 98.5°F | Wt 192.0 lb

## 2016-07-08 DIAGNOSIS — A6000 Herpesviral infection of urogenital system, unspecified: Secondary | ICD-10-CM

## 2016-07-08 DIAGNOSIS — I10 Essential (primary) hypertension: Secondary | ICD-10-CM

## 2016-07-08 MED ORDER — HYDROCHLOROTHIAZIDE 25 MG PO TABS
25.0000 mg | ORAL_TABLET | Freq: Every day | ORAL | 1 refills | Status: AC
Start: 2016-07-08 — End: ?

## 2016-07-08 NOTE — Progress Notes (Signed)
Subjective:      Patient ID: Katherine Freeman  is a 66 y.o.  female.     Chief Complaint   Patient presents with   . Hypertension     f/u      HPI    Problem   Hypertensive Disorder    07/08/2016 was in [place] last week when she developed sbp 170/110 and ruptured a blood vessel in her right eye. She had been weaning off hctz  until last august when she stopped it completely after a 30 lb wt loss. Unfortunately the bp spiked. She is taking 25 mg and ha has resolved. bp readings look much better now 110-140 systolic.    12/25/2015 Lost 20 lbs and was able to decrease hctz dose to 12.5 mg. bp is excellent today. She denies cp, sob, ha, dizziness.     Herpes, Genital    07/08/2016 pt was excited to report that the 10 day full treatment resolved her outbreaks. She has been off the medication since early September and has had no flares. She is thrilled since stopping it. Asked to relay the information to Dr. Regino Schultze.  Contracted in 2012. She requires the prophylactic medication or she will get a flareup. She would like to continue it for now and try to stop again next year.     Arthralgia, Unspecified Joint (Resolved)   Lower GI Bleed (Resolved)    01/2015. 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 She never felt good when she took the medication. Symptoms developed rather quickly. She started bleeding at 5 pm and initially ebbed and flowed, then at 8 pm she had more bleedin gand went to ER. She was admitted for 4 days. She has not had any further bleeding. She had egd/colonoscopy on Monday Sept 19 which showed only diverticulosis and a couple of small polyps which were removed. It is presumed that one of the diverticula bled. She has fh cancer. Brother died of stomach cancer at age 74, sister died at 67 of lung cancer, niece died of ca of small intestine in her mid 25s.  Lowest hgb was 7.7 on 9/19. Ferritin 48.           The following portions of the patient's history were reviewed and updated as appropriate: past family  history, past medical history, past social history, past surgical history and problem list.     Current Outpatient Prescriptions on File Prior to Visit   Medication Sig Dispense Refill   . rosuvastatin (CRESTOR) 10 MG tablet Take 1 tablet (10 mg total) by mouth daily. 90 tablet 3   . sertraline (ZOLOFT) 100 MG tablet Take 1 tablet (100 mg total) by mouth daily. 90 tablet 3   . [DISCONTINUED] hydroCHLOROthiazide (HYDRODIURIL) 12.5 MG tablet Take 1 tablet (12.5 mg total) by mouth daily. 90 tablet 3   . amoxicillin (AMOXIL) 500 MG capsule Take 4 capsules (2,000 mg total) by mouth 1 hour before appointment. 16 capsule 0     No current facility-administered medications on file prior to visit.      No Known Allergies    Review of Systems   Constitutional: Negative for appetite change, chills and fever.   Eyes: Positive for redness.   Respiratory: Negative for cough and shortness of breath.    Cardiovascular: Negative for chest pain, palpitations and leg swelling.   Gastrointestinal: Negative for abdominal pain, constipation, diarrhea, nausea and vomiting.   Genitourinary: Negative for dysuria, flank pain, hematuria and urgency.  Musculoskeletal: Negative for back pain.   Skin: Negative for pallor and rash.   Neurological: Positive for headaches. Negative for tremors, speech difficulty and weakness.   Psychiatric/Behavioral: Negative for confusion.           BP 137/79 (BP Site: Right arm)   Pulse 66   Temp 98.5 F (36.9 C) (Oral)   Wt 87.1 kg (192 lb)   BMI 34.84 kg/m     Objective:   Physical Exam   Constitutional: She is oriented to person, place, and time. She appears well-developed and well-nourished. No distress.   HENT:   Head: Normocephalic and atraumatic.   Neck: Neck supple. No thyromegaly present.   Cardiovascular: Normal rate and regular rhythm.  Exam reveals no gallop and no friction rub.    No murmur heard.  Pulmonary/Chest: Effort normal. She has no wheezes. She has no rales.   Lymphadenopathy:     She  has no cervical adenopathy.   Neurological: She is alert and oriented to person, place, and time.   Skin: Skin is warm. No rash noted.   Psychiatric: She has a normal mood and affect. Her behavior is normal. Judgment normal.          Assessment:     1. 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 Essential hypertension  Patient discontinued her blood pressure medication last August in hopes that weight gain had been sufficient to reduce the need for medication.  Unfortunately, she had an episode of very high blood pressure last week, which led to a conjunctival hemorrhage and a trip to urgent care.  She restarted hydrochlorothiazide and her blood pressure appears to be down below 140 systolic now.  Plans to continue taking it.  Low sodium DASH diet as recommended. The American Heart Association recommends 30 minutes of moderate exercise a minimum of five days per week for overall good health, reduction of obesity risk, and blood pressure control.     - hydroCHLOROthiazide (HYDRODIURIL) 25 MG tablet; Take 1 tablet (25 mg total) by mouth daily.  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  Patient been taking prophylaxis for five years but had never actually had a full course of treatment.  She has had no further flares in six months and is very happy to report it.    Patient states that she has gotten a new job at [employer] and plans to relocate to [facility] in the next couple of months.  We wish her all the best in her new adventures.    Risk & Benefits of any previous or new medication(s) were explained to the patient who verbalized understanding & agreed to the treatment plan. She will call with updates, questions or concerns.     Nonnie Done Shelva Majestic, MD, Jerrel Ivory

## 2016-07-08 NOTE — Patient Instructions (Signed)
It was nice to see you again today. Thank you for choosing [facility] for your healthcare needs.  Sincerely, Murline Weigel, MD, FACP

## 2016-07-08 NOTE — Progress Notes (Signed)
Nursing Documentation:  Limb alert status: Patient asked and denied any limb restrictions for blood pressure/blood draws.  Has the patient seen any other providers since their last visit: no  The patient is due for mammogram and pap smear

## 2016-08-26 ENCOUNTER — Telehealth (INDEPENDENT_AMBULATORY_CARE_PROVIDER_SITE_OTHER): Payer: Self-pay

## 2016-08-26 NOTE — Telephone Encounter (Signed)
The [facility] Coordinator reached out to the patient to see if we could be of any assistance in managing their health care needs. The patient did not answer and a voicemail message was left to call back.     Katherine Burck, LPN   [facility]

## 2016-08-30 ENCOUNTER — Telehealth (INDEPENDENT_AMBULATORY_CARE_PROVIDER_SITE_OTHER): Payer: Self-pay

## 2016-08-30 NOTE — Telephone Encounter (Signed)
The [facility] Coordinator reached out to the patient to see if we could be of any assistance in managing their health care needs. The patient did not answer and a voicemail message was left to call back.     Adedamola Seto, LPN   [facility]

## 2016-09-06 ENCOUNTER — Telehealth (INDEPENDENT_AMBULATORY_CARE_PROVIDER_SITE_OTHER): Payer: Self-pay

## 2016-09-06 NOTE — Telephone Encounter (Signed)
The [facility] Coordinator reached out to the patient to see if we could be of any assistance in managing their health care needs. The patient did not answer and I was unable to leave a message to call us back.       Whitaker Holderman, LPN   [facility]

## 2016-09-23 ENCOUNTER — Encounter (INDEPENDENT_AMBULATORY_CARE_PROVIDER_SITE_OTHER): Payer: Self-pay

## 2017-02-19 ENCOUNTER — Ambulatory Visit (INDEPENDENT_AMBULATORY_CARE_PROVIDER_SITE_OTHER): Payer: BLUE CROSS/BLUE SHIELD | Admitting: Nurse Practitioner

## 2017-02-19 ENCOUNTER — Encounter: Payer: Self-pay | Admitting: Nurse Practitioner

## 2017-02-19 VITALS — BP 120/72 | HR 66 | Temp 98.4°F | Resp 18 | Ht 62.5 in | Wt 200.6 lb

## 2017-02-19 DIAGNOSIS — A6009 Herpesviral infection of other urogenital tract: Secondary | ICD-10-CM | POA: Insufficient documentation

## 2017-02-19 DIAGNOSIS — E782 Mixed hyperlipidemia: Secondary | ICD-10-CM

## 2017-02-19 DIAGNOSIS — F325 Major depressive disorder, single episode, in full remission: Secondary | ICD-10-CM

## 2017-02-19 DIAGNOSIS — M159 Polyosteoarthritis, unspecified: Secondary | ICD-10-CM

## 2017-02-19 DIAGNOSIS — I10 Essential (primary) hypertension: Secondary | ICD-10-CM | POA: Diagnosis not present

## 2017-02-19 DIAGNOSIS — M150 Primary generalized (osteo)arthritis: Secondary | ICD-10-CM

## 2017-02-19 LAB — COMPLETE METABOLIC PANEL WITH GFR
AG RATIO: 1.3 (calc) (ref 1.0–2.5)
ALBUMIN MSPROF: 4 g/dL (ref 3.6–5.1)
ALT: 10 U/L (ref 6–29)
AST: 17 U/L (ref 10–35)
Alkaline phosphatase (APISO): 76 U/L (ref 33–130)
BUN: 23 mg/dL (ref 7–25)
CALCIUM: 9.2 mg/dL (ref 8.6–10.4)
CO2: 29 mmol/L (ref 20–32)
Chloride: 106 mmol/L (ref 98–110)
Creat: 0.94 mg/dL (ref 0.50–0.99)
GFR, EST AFRICAN AMERICAN: 74 mL/min/{1.73_m2} (ref >=60)
GFR, EST NON AFRICAN AMERICAN: 64 mL/min/{1.73_m2} (ref >=60)
GLOBULIN: 3 g/dL (ref 1.9–3.7)
Glucose, Bld: 85 mg/dL (ref 65–99)
POTASSIUM: 3.9 mmol/L (ref 3.5–5.3)
SODIUM: 141 mmol/L (ref 135–146)
TOTAL PROTEIN: 7 g/dL (ref 6.1–8.1)
Total Bilirubin: 0.4 mg/dL (ref 0.2–1.2)

## 2017-02-19 LAB — CBC WITH DIFFERENTIAL/PLATELET
BASOS PCT: 0.6 %
Basophils Absolute: 42 cells/uL (ref 0–200)
EOS ABS: 77 {cells}/uL (ref 15–500)
Eosinophils Relative: 1.1 %
HCT: 36.9 % (ref 35.0–45.0)
HEMOGLOBIN: 12.2 g/dL (ref 11.7–15.5)
Lymphs Abs: 2282 cells/uL (ref 850–3900)
MCH: 30.5 pg (ref 27.0–33.0)
MCHC: 33.1 g/dL (ref 32.0–36.0)
MCV: 92.3 fL (ref 80.0–100.0)
MONOS PCT: 9.4 %
MPV: 10.4 fL (ref 7.5–12.5)
NEUTROS ABS: 3941 {cells}/uL (ref 1500–7800)
Neutrophils Relative %: 56.3 %
Platelets: 246 10*3/uL (ref 140–400)
RBC: 4 10*6/uL (ref 3.80–5.10)
RDW: 13.2 % (ref 11.0–15.0)
Total Lymphocyte: 32.6 %
WBC mixed population: 658 cells/uL (ref 200–950)
WBC: 7 10*3/uL (ref 3.8–10.8)

## 2017-02-19 LAB — LIPID PANEL
CHOL/HDL RATIO: 2.1 (calc) (ref <5.0)
CHOLESTEROL: 177 mg/dL (ref <200)
HDL: 83 mg/dL (ref >50)
LDL CHOLESTEROL (CALC): 81 mg/dL
Non-HDL Cholesterol (Calc): 94 mg/dL (calc) (ref <130)
TRIGLYCERIDES: 54 mg/dL (ref <150)

## 2017-02-19 MED ORDER — VALACYCLOVIR HCL 500 MG PO TABS: 500.0000 mg | ORAL_TABLET | ORAL | 0 refills | Status: DC | PRN

## 2017-02-19 MED ORDER — HYDROCHLOROTHIAZIDE 25 MG PO TABS: 25.0000 mg | ORAL_TABLET | Freq: Every day | ORAL | 1 refills | Status: DC

## 2017-02-19 MED ORDER — ROSUVASTATIN CALCIUM 10 MG PO TABS: 10.0000 mg | ORAL_TABLET | Freq: Every day | ORAL | 3 refills | Status: DC

## 2017-02-19 MED ORDER — SERTRALINE HCL 100 MG PO TABS: 100.0000 mg | ORAL_TABLET | Freq: Every day | ORAL | 3 refills | Status: DC

## 2017-02-19 MED FILL — ROSUVASTATIN CALCIUM 10 MG: 10 | 90 days supply | Qty: 90 | Fill #0

## 2017-02-19 MED FILL — HYDROCHLOROTHIAZIDE 25 MG T: 25 | 90 days supply | Qty: 90 | Fill #0

## 2017-02-19 MED FILL — SERTRALINE HCL 100 MG TAB: 100 | 30 days supply | Qty: 30 | Fill #0

## 2017-02-19 NOTE — Progress Notes (Signed)
Careteam: Patient Care Team: Lauree Chandler, NP as PCP - General (Geriatric Medicine)  Advanced Directive information Does Patient Have a Medical Advance Directive?: No, Would patient like information on creating a medical advance directive?: Yes (MAU/Ambulatory/Procedural Areas - Information given)  No Known Allergies  Chief Complaint  Patient presents with  . Medical Management of Chronic Issues    Pt is being seen to establish care for management of chronic conditions.      HPI: Patient is a 66 y.o. female seen in the office today to establish care.  Ms Oconnell moved to here from [place] area last year and was previously going to PCP there.  Last physical was summer 2017.  Had PAP in the last few years which was normal, was told she did not have to have another.  Fasting today.   Hypertension- on hctz   Genital herpes- rarely will have a flare- uses 1000 mg twice daily for a few days which will   Depression- in remission on zoloft.   Has had dexa scan in the past- unsure what year this was- reported it was normal- states she may have gotten this when she turned 61.   Hx of GI due to meloxicam- only took for 15 days.  Hx of abnormal mammogram had breast biopsy which was negative.  Last colonoscopy 2016- every 5 years, family hx of colon cancer.   Review of Systems:  Review of Systems  Constitutional: Negative for chills, fever and weight loss.  HENT: Negative for tinnitus.   Eyes:       Hx of glaucoma and corrective lens, plans to go to [facility] eye care  Respiratory: Negative for cough, sputum production and shortness of breath.   Cardiovascular: Negative for chest pain, palpitations and leg swelling.  Gastrointestinal: Positive for heartburn (diet controlled.). Negative for abdominal pain, constipation and diarrhea.  Genitourinary: Negative for dysuria, frequency and urgency.  Musculoskeletal: Positive for back pain (occasional) and joint pain (bilateral  hips, right thumb). Negative for falls and myalgias.  Skin: Negative.   Neurological: Negative for dizziness and headaches.  Psychiatric/Behavioral: Positive for depression (in remission). Negative for memory loss. The patient does not have insomnia.    Past Medical History:  Diagnosis Date  . Depression   . Elevated cholesterol   . H/O mammogram    2015, 2016  . 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 High blood pressure   . History of esophagogastroduodenoscopy (EGD)    2014, 2016  . History of PFTs 10/26/2012  . Lower GI bleed 01/19/2015   Related to mobic 15 mg   . Mitral valve prolapse 09/06/1996  . Osteoporosis 09/22/2007   Past Surgical History:  Procedure Laterality Date  . BREAST SURGERY  05/20/2007   [facility], Dr.William Purkert   . COLONOSCOPY     2014, 2016 Dr.Bajani Manalo, Dr.Mahmood Abedi    Social History:   reports that she has quit smoking. She has a 5.00 pack-year smoking history. She has never used smokeless tobacco. She reports that she drinks alcohol. She reports that she does not use drugs.  Family History  Problem Relation Age of Onset  . Heart attack Mother   . CVA Mother   . Diabetes Mother   . Congestive Heart Failure Mother   . Stroke Mother   . Lung cancer Sister 11  . Cancer Brother        GI or colon cancer  . Neurologic Disorder Daughter   .  Cancer Other        Niece-colon cancer  . Diabetes Other        Nephew    Medications: Patient's Medications  New Prescriptions   No medications on file  Previous Medications   HYDROCHLOROTHIAZIDE (HYDRODIURIL) 25 MG TABLET    Take 25 mg by mouth daily.   ROSUVASTATIN (CRESTOR) 10 MG TABLET    Take 10 mg by mouth daily.   SERTRALINE (ZOLOFT) 100 MG TABLET    Take 100 mg by mouth daily.   VALACYCLOVIR (VALTREX) 500 MG TABLET    Take 500 mg by mouth as needed.  Modified Medications   No medications on file  Discontinued Medications   No medications on file     Physical Exam:  Vitals:   02/19/17 0859   BP: 120/72  Pulse: 66  Resp: 18  Temp: 98.4 F (36.9 C)  TempSrc: Oral  SpO2: 98%  Weight: 200 lb 9.6 oz (91 kg)  Height: 5' 2.5" (1.588 m)   Body mass index is 36.11 kg/m.  Physical Exam  Constitutional: She is oriented to person, place, and time. She appears well-developed and well-nourished. No distress.  HENT:  Head: Normocephalic and atraumatic.  Mouth/Throat: Oropharynx is clear and moist. No oropharyngeal exudate.  Eyes: Pupils are equal, round, and reactive to light. Conjunctivae are normal.  Neck: Normal range of motion. Neck supple.  Cardiovascular: Normal rate, regular rhythm and normal heart sounds.   Pulmonary/Chest: Effort normal and breath sounds normal.  Abdominal: Soft. Bowel sounds are normal.  Musculoskeletal: She exhibits no edema or tenderness.  Neurological: She is alert and oriented to person, place, and time.  Skin: Skin is warm and dry. She is not diaphoretic.  Psychiatric: She has a normal mood and affect.    Labs reviewed: Basic Metabolic Panel: No results for input(s): NA, K, CL, CO2, GLUCOSE, BUN, CREATININE, CALCIUM, MG, PHOS, TSH in the last 8760 hours. Liver Function Tests: No results for input(s): AST, ALT, ALKPHOS, BILITOT, PROT, ALBUMIN in the last 8760 hours. No results for input(s): LIPASE, AMYLASE in the last 8760 hours. No results for input(s): AMMONIA in the last 8760 hours. CBC: No results for input(s): WBC, NEUTROABS, HGB, HCT, MCV, PLT in the last 8760 hours. Lipid Panel: No results for input(s): CHOL, HDL, LDLCALC, TRIG, CHOLHDL, LDLDIRECT in the last 8760 hours. TSH: No results for input(s): TSH in the last 8760 hours. A1C: No results found for: HGBA1C   Assessment/Plan 1. Essential hypertension -controlled on diet modifications and hctz - hydrochlorothiazide (HYDRODIURIL) 25 MG tablet; Take 1 tablet (25 mg total) by mouth daily.  Dispense: 90 tablet; Refill: 1 - CBC with Differential/Platelets  2. Mixed  hyperlipidemia -to cont dietary modifications with medication.  - rosuvastatin (CRESTOR) 10 MG tablet; Take 1 tablet (10 mg total) by mouth daily.  Dispense: 30 tablet; Refill: 3 - CMP with eGFR - Lipid Panel  3. Depression, major, single episode, complete remission ([place]) -controlled on zoloft - sertraline (ZOLOFT) 100 MG tablet; Take 1 tablet (100 mg total) by mouth daily.  Dispense: 30 tablet; Refill: 3  4. Herpes genitalis in women -rarely will have breakout, uses valtrex as needed  - valACYclovir (VALTREX) 500 MG tablet; Take 1 tablet (500 mg total) by mouth as needed.  Dispense: 30 tablet; Refill: 0  5. Primary osteoarthritis involving multiple joints -occasional pain, encouraged. Tylenol 650 mg by mouth every 6 hours as needed pain.   Next appt: 1 month for welcome to medicare and  physical  Carlos American. Harle Battiest  [facility] [phone] 8 am - 5 pm) [phone] (after hours)

## 2017-02-19 NOTE — Patient Instructions (Signed)
Tylenol (acetaminophen) 650 mg by mouth every 6 hours as needed pain

## 2017-03-21 ENCOUNTER — Encounter: Payer: Self-pay | Admitting: Nurse Practitioner

## 2017-03-21 ENCOUNTER — Ambulatory Visit (INDEPENDENT_AMBULATORY_CARE_PROVIDER_SITE_OTHER): Payer: 59 | Admitting: Nurse Practitioner

## 2017-03-21 VITALS — BP 118/74 | HR 73 | Temp 98.5°F | Resp 17 | Ht 63.0 in | Wt 199.0 lb

## 2017-03-21 DIAGNOSIS — E2839 Other primary ovarian failure: Secondary | ICD-10-CM

## 2017-03-21 DIAGNOSIS — M7061 Trochanteric bursitis, right hip: Secondary | ICD-10-CM | POA: Diagnosis not present

## 2017-03-21 DIAGNOSIS — Z1231 Encounter for screening mammogram for malignant neoplasm of breast: Secondary | ICD-10-CM

## 2017-03-21 DIAGNOSIS — E782 Mixed hyperlipidemia: Secondary | ICD-10-CM

## 2017-03-21 DIAGNOSIS — I10 Essential (primary) hypertension: Secondary | ICD-10-CM | POA: Diagnosis not present

## 2017-03-21 DIAGNOSIS — Z23 Encounter for immunization: Secondary | ICD-10-CM | POA: Diagnosis not present

## 2017-03-21 DIAGNOSIS — Z0000 Encounter for general adult medical examination without abnormal findings: Secondary | ICD-10-CM | POA: Diagnosis not present

## 2017-03-21 DIAGNOSIS — F325 Major depressive disorder, single episode, in full remission: Secondary | ICD-10-CM | POA: Diagnosis not present

## 2017-03-21 NOTE — Progress Notes (Signed)
Provider: [name], NP  Patient Care Team: [name], NP as PCP - General (Geriatric Medicine)  Extended Emergency Contact Information Primary Emergency Contact: [facility] Address: [address]          [place], [place] [address] [place] Home Phone: [phone] Relation: Brother Allergies  Allergen Reactions  . Mobic [Meloxicam]     GI bleed   Code Status: FULL Goals of Care: Advanced Directive information Advanced Directives 03/21/2017  Does Patient Have a Medical Advance Directive? No  Would patient like information on creating a medical advance directive? No - Patient declined     Chief Complaint  Patient presents with  . Medical Management of Chronic Issues    Pt is being seen for a welcome to medicare visit and follow up on chronic conditions.   Marland Kitchen MMSE    30/30; passed clock drawing    HPI: Patient is a 66 y.o. female seen in today for an annual wellness exam.   Major illnesses or hospitalization in the last year- none  Depression screen [facility] 2/9 03/21/2017 02/19/2017  Decreased Interest 0 0  Down, Depressed, Hopeless 0 0  PHQ - 2 Score 0 0  no anxiety or depression- remission with medication  Fall Risk  03/21/2017 02/19/2017  Falls in the past year? No No   MMSE - Mini Mental State Exam 03/21/2017  Orientation to time 5  Orientation to Place 5  Registration 3  Attention/ Calculation 5  Recall 3  Language- name 2 objects 2  Language- repeat 1  Language- follow 3 step command 3  Language- read & follow direction 1  Write a sentence 1  Copy design 1  Total score 30     Health Maintenance  Topic Date Due  . MAMMOGRAM  05/06/2016  . PNA vac Low Risk Adult (2 of 2 - PPSV23) 01/12/2017  . PAP SMEAR  05/06/2017  . COLONOSCOPY  05/06/2024  . TETANUS/TDAP  10/17/2024  . INFLUENZA VACCINE  Completed  . DEXA SCAN  Completed  . Hepatitis C Screening  Completed  . HIV Screening  Completed    Urinary  incontinence? none Functional Status Survey: Is the patient deaf or have difficulty hearing?: No Does the patient have difficulty seeing, even when wearing glasses/contacts?: No Does the patient have difficulty concentrating, remembering, or making decisions?: No Does the patient have difficulty walking or climbing stairs?: No Does the patient have difficulty dressing or bathing?: No Does the patient have difficulty doing errands alone such as visiting a doctor's office or shopping?: No Current Exercise Habits: Home exercise routine;Structured exercise class, Type of exercise: treadmill;strength training/weights;walking, Time (Minutes): 30, Frequency (Times/Week): 3, Weekly Exercise (Minutes/Week): 90, Intensity: Moderate    Started gaining weight because of lack of activity and no routine exercise some weeks she does good other times she does not do any.   Diet?none, attempts to eat healthy, was vegan but now eating meat.   Vision Screening Comments: Pt has an appointment with Dr. Dione Booze on 04/07/17   Dentition: has not had appt since she has moved to [facility]. August of last year.   Pain: right hip at night, turns to the left which resolves this. No pain during the day.   Past Medical History:  Diagnosis Date  . Arthritis   . Depression   . Diverticulosis    noted on colonscopy   . Elevated cholesterol   . H/O mammogram    2015, 2016  . 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 High blood pressure   . History of esophagogastroduodenoscopy (EGD)    2014, 2016  . History of PFTs 10/26/2012  . Lower GI bleed 01/19/2015   Related to mobic 15 mg   . Mitral valve prolapse 09/06/1996  . Osteoarthritis of both knees     Past Surgical History:  Procedure Laterality Date  . BREAST SURGERY  05/20/2007   [facility], Dr.William Purkert   . COLONOSCOPY     2014, 2016 Dr.Bajani Manalo, Dr.Mahmood Abedi     Social History   Socioeconomic History  . Marital status: Divorced    Spouse name: None  .  Number of children: None  . Years of education: None  . Highest education level: None  Social Needs  . Financial resource strain: None  . Food insecurity - worry: None  . Food insecurity - inability: None  . Transportation needs - medical: None  . Transportation needs - non-medical: None  Occupational History  . None  Tobacco Use  . Smoking status: Former Smoker    Packs/day: 0.25    Years: 20.00    Pack years: 5.00  . Smokeless tobacco: Never Used  Substance and Sexual Activity  . Alcohol use: Yes    Comment: Occasionally red wine   . Drug use: No  . Sexual activity: Not Currently  Other Topics Concern  . None  Social History Narrative   Diet: Regular       Caffeine: Yes, infrequently when working       Married, if yes what year: Divorced, married in 1974      Do you live in a house, apartment, assisted living, condo, trailer, ect: House, 1 stories, one person       Pets: None      Current/Past profession:       Exercise: Yes, walking, hiking, and gym          Living Will: No   DNR: No   POA/HPOA: No      Questions below not in new patient packet    Functional Status:   Do you have difficulty bathing or dressing yourself?   Do you have difficulty preparing food or eating?   Do you have difficulty managing your medications?   Do you have difficulty managing your finances?   Do you have difficulty affording your medications?    Family History  Problem Relation Age of Onset  . Heart attack Mother   . CVA Mother   . Diabetes Mother   . Congestive Heart Failure Mother   . Stroke Mother   . Heart disease Mother   . Lung cancer Sister 80  . Cancer Brother        GI or colon cancer  . Neurologic Disorder Son   . Cancer Other        Niece-colon cancer  . Diabetes Other        Nephew    Review of Systems:  Review of Systems  Constitutional: Negative for activity change, appetite change, fatigue and unexpected weight change.  HENT: Negative for  congestion.   Eyes: Negative for visual disturbance.  Respiratory: Negative for shortness of breath and wheezing.   Cardiovascular: Negative for chest pain and leg swelling.  Gastrointestinal: Negative for abdominal distention, constipation, diarrhea and nausea.  Genitourinary: Negative for difficulty urinating.  Musculoskeletal: Negative for arthralgias, back pain, gait problem and myalgias.       Right side of hip pain  Skin: Negative for color change.  Neurological: Negative for dizziness and numbness.  Psychiatric/Behavioral: Positive for decreased concentration. Negative for sleep disturbance. The patient is not nervous/anxious.      Allergies as of 03/21/2017      Reactions   Mobic [meloxicam]    GI bleed      Medication List        Accurate as of 03/21/17  9:25 AM. Always use your most recent med list.          hydrochlorothiazide 25 MG tablet Commonly known as:  HYDRODIURIL Take 1 tablet (25 mg total) by mouth daily.   rosuvastatin 10 MG tablet Commonly known as:  CRESTOR Take 1 tablet (10 mg total) by mouth daily.   sertraline 100 MG tablet Commonly known as:  ZOLOFT Take 1 tablet (100 mg total) by mouth daily.   valACYclovir 500 MG tablet Commonly known as:  VALTREX Take 1 tablet (500 mg total) by mouth as needed.         Physical Exam: Vitals:   03/21/17 0908  BP: 118/74  Pulse: 73  Resp: 17  Temp: 98.5 F (36.9 C)  TempSrc: Oral  SpO2: 95%  Weight: 199 lb (90.3 kg)  Height: 5\' 3"  (1.6 m)   Body mass index is 35.25 kg/m. Physical Exam  Constitutional: She is oriented to person, place, and time. She appears well-developed and well-nourished. No distress.  HENT:  Head: Normocephalic and atraumatic.  Mouth/Throat: Oropharynx is clear and moist. No oropharyngeal exudate.  Eyes: Conjunctivae are normal. Pupils are equal, round, and reactive to light.  Neck: Normal range of motion. Neck supple. No thyromegaly present.  Cardiovascular: Normal  rate, regular rhythm and normal heart sounds.  Pulmonary/Chest: Effort normal and breath sounds normal.  Abdominal: Soft. Bowel sounds are normal.  Musculoskeletal: Normal range of motion. She exhibits no edema or tenderness.  Lymphadenopathy:    She has no cervical adenopathy.  Neurological: She is alert and oriented to person, place, and time. She has normal reflexes.  Skin: Skin is warm and dry. She is not diaphoretic.  Psychiatric: She has a normal mood and affect.    Labs reviewed: Basic Metabolic Panel: Recent Labs    02/19/17 1027  NA 141  K 3.9  CL 106  CO2 29  GLUCOSE 85  BUN 23  CREATININE 0.94  CALCIUM 9.2   Liver Function Tests: Recent Labs    02/19/17 1027  AST 17  ALT 10  BILITOT 0.4  PROT 7.0   No results for input(s): LIPASE, AMYLASE in the last 8760 hours. No results for input(s): AMMONIA in the last 8760 hours. CBC: Recent Labs    02/19/17 1027  WBC 7.0  NEUTROABS 3,941  HGB 12.2  HCT 36.9  MCV 92.3  PLT 246   Lipid Panel: Recent Labs    02/19/17 1027  CHOL 177  HDL 83  TRIG 54  CHOLHDL 2.1   Lab Results  Component Value Date   HGBA1C 5.6 12/25/2015    Procedures: No results found.  Assessment/Plan 1. Initial Medicare annual wellness visit -pt doing well.  The patient was counseled regarding the appropriate use of alcohol, regular self-examination of the breasts on a monthly basis, prevention of dental and periodontal disease, diet, regular sustained exercise for at least 30 minutes 5 times per week, routine screening interval for mammogram as recommended by the tobacco use,  and recommended schedule for GI hemoccult testing, colonoscopy, cholesterol, thyroid and diabetes screening. -plan to follow up Dexa scan -plans to get  mammogram -MMSE 30/30 -no anxiety or depression   2. Essential hypertension Stable on HCTZ. Labs stable. Will cont current regimen.   3. Depression, major, single episode, complete remission (HCC) In  remission on zoloft.   4. Mixed hyperlipidemia Controlled with diet and Crestor. Will cont current regimen  5. Estrogen deficiency - DG Bone Density; Future  6. Screening mammogram, encounter for - MM Digital Screening; Future  7. Need for pneumococcal vaccination - Pneumococcal polysaccharide vaccine 23-valent greater than or equal to 2yo subcutaneous/IM  8. Trochanteric bursitis of right hip Unable to tolerate NSAIDS due to GI bleed -discussed treatment with prednisone taper but she would like to hold off on this for now and try exercises with warm compress. Will notify if this worsens.   Next appt: 6 months with Dr Montez Moritaarter.  Janene HarveyJessica K. Biagio Borgubanks, AGNP  [facility] [phone](Monday-Friday 8 am - 5 pm) [phone] (after hours)

## 2017-03-21 NOTE — Patient Instructions (Addendum)
Alejandra Crane Alejandra Crane DDS (DENTIST)  Dentist in [place], [place] Address: [address], [place], [place] [address] Phone: [phone]   To use ALEVE 1 tablet by mouth twice daily for 1 week routinely Use exercises for pain and heat or ice  Follow up in 6 months with Dr Eulas Post,  1 year with Me, sooner if needed   Health Maintenance, Female Adopting a healthy lifestyle and getting preventive care can go a long way to promote health and wellness. Talk with your health care provider about what schedule of regular examinations is right for you. This is a good chance for you to check in with your provider about disease prevention and staying healthy. In between checkups, there are plenty of things you can do on your own. Experts have done a lot of research about which lifestyle changes and preventive measures are most likely to keep you healthy. Ask your health care provider for more information. Weight and diet Eat a healthy diet  Be sure to include plenty of vegetables, fruits, low-fat dairy products, and lean protein.  Do not eat a lot of foods high in solid fats, added sugars, or salt.  Get regular exercise. This is one of the most important things you can do for your health. ? Most adults should exercise for at least 150 minutes each week. The exercise should increase your heart rate and make you sweat (moderate-intensity exercise). ? Most adults should also do strengthening exercises at least twice a week. This is in addition to the moderate-intensity exercise.  Maintain a healthy weight  Body mass index (BMI) is a measurement that can be used to identify possible weight problems. It estimates body fat based on height and weight. Your health care provider can help determine your BMI and help you achieve or maintain a healthy weight.  For females 17 years of age and older: ? A BMI below 18.5 is considered underweight. ? A BMI of 18.5 to 24.9 is normal. ? A BMI of 25 to 29.9 is  considered overweight. ? A BMI of 30 and above is considered obese.  Watch levels of cholesterol and blood lipids  You should start having your blood tested for lipids and cholesterol at 65 years of age, then have this test every 5 years.  You may need to have your cholesterol levels checked more often if: ? Your lipid or cholesterol levels are high. ? You are older than 65 years of age. ? You are at high risk for heart disease.  Cancer screening Lung Cancer  Lung cancer screening is recommended for adults 8-64 years old who are at high risk for lung cancer because of a history of smoking.  A yearly low-dose CT scan of the lungs is recommended for people who: ? Currently smoke. ? Have quit within the past 15 years. ? Have at least a 30-pack-year history of smoking. A pack year is smoking an average of one pack of cigarettes a day for 1 year.  Yearly screening should continue until it has been 15 years since you quit.  Yearly screening should stop if you develop a health problem that would prevent you from having lung cancer treatment.  Breast Cancer  Practice breast self-awareness. This means understanding how your breasts normally appear and feel.  It also means doing regular breast self-exams. Let your health care provider know about any changes, no matter how small.  If you are in your 20s or 30s, you should have a  clinical breast exam (CBE) by a health care provider every 1-3 years as part of a regular health exam.  If you are 69 or older, have a CBE every year. Also consider having a breast X-ray (mammogram) every year.  If you have a family history of breast cancer, talk to your health care provider about genetic screening.  If you are at high risk for breast cancer, talk to your health care provider about having an MRI and a mammogram every year.  Breast cancer gene (BRCA) assessment is recommended for women who have family members with BRCA-related cancers.  BRCA-related cancers include: ? Breast. ? Ovarian. ? Tubal. ? Peritoneal cancers.  Results of the assessment will determine the need for genetic counseling and BRCA1 and BRCA2 testing.  Cervical Cancer Your health care provider may recommend that you be screened regularly for cancer of the pelvic organs (ovaries, uterus, and vagina). This screening involves a pelvic examination, including checking for microscopic changes to the surface of your cervix (Pap test). You may be encouraged to have this screening done every 3 years, beginning at age 53.  For women ages 44-65, health care providers may recommend pelvic exams and Pap testing every 3 years, or they may recommend the Pap and pelvic exam, combined with testing for human papilloma virus (HPV), every 5 years. Some types of HPV increase your risk of cervical cancer. Testing for HPV may also be done on women of any age with unclear Pap test results.  Other health care providers may not recommend any screening for nonpregnant women who are considered low risk for pelvic cancer and who do not have symptoms. Ask your health care provider if a screening pelvic exam is right for you.  If you have had past treatment for cervical cancer or a condition that could lead to cancer, you need Pap tests and screening for cancer for at least 20 years after your treatment. If Pap tests have been discontinued, your risk factors (such as having a new sexual partner) need to be reassessed to determine if screening should resume. Some women have medical problems that increase the chance of getting cervical cancer. In these cases, your health care provider may recommend more frequent screening and Pap tests.  Colorectal Cancer  This type of cancer can be detected and often prevented.  Routine colorectal cancer screening usually begins at 65 years of age and continues through 65 years of age.  Your health care provider may recommend screening at an earlier age if  you have risk factors for colon cancer.  Your health care provider may also recommend using home test kits to check for hidden blood in the stool.  A small camera at the end of a tube can be used to examine your colon directly (sigmoidoscopy or colonoscopy). This is done to check for the earliest forms of colorectal cancer.  Routine screening usually begins at age 66.  Direct examination of the colon should be repeated every 5-10 years through 65 years of age. However, you may need to be screened more often if early forms of precancerous polyps or small growths are found.  Skin Cancer  Check your skin from head to toe regularly.  Tell your health care provider about any new moles or changes in moles, especially if there is a change in a mole's shape or color.  Also tell your health care provider if you have a mole that is larger than the size of a pencil eraser.  Always use sunscreen.  Apply sunscreen liberally and repeatedly throughout the day.  Protect yourself by wearing long sleeves, pants, a wide-brimmed hat, and sunglasses whenever you are outside.  Heart disease, diabetes, and high blood pressure  High blood pressure causes heart disease and increases the risk of stroke. High blood pressure is more likely to develop in: ? People who have blood pressure in the high end of the normal range (130-139/85-89 mm Hg). ? People who are overweight or obese. ? People who are African American.  If you are 33-55 years of age, have your blood pressure checked every 3-5 years. If you are 13 years of age or older, have your blood pressure checked every year. You should have your blood pressure measured twice-once when you are at a hospital or clinic, and once when you are not at a hospital or clinic. Record the average of the two measurements. To check your blood pressure when you are not at a hospital or clinic, you can use: ? An automated blood pressure machine at a pharmacy. ? A home blood  pressure monitor.  If you are between 41 years and 67 years old, ask your health care provider if you should take aspirin to prevent strokes.  Have regular diabetes screenings. This involves taking a blood sample to check your fasting blood sugar level. ? If you are at a normal weight and have a low risk for diabetes, have this test once every three years after 65 years of age. ? If you are overweight and have a high risk for diabetes, consider being tested at a younger age or more often. Preventing infection Hepatitis B  If you have a higher risk for hepatitis B, you should be screened for this virus. You are considered at high risk for hepatitis B if: ? You were born in a country where hepatitis B is common. Ask your health care provider which countries are considered high risk. ? Your parents were born in a high-risk country, and you have not been immunized against hepatitis B (hepatitis B vaccine). ? You have HIV or AIDS. ? You use needles to inject street drugs. ? You live with someone who has hepatitis B. ? You have had sex with someone who has hepatitis B. ? You get hemodialysis treatment. ? You take certain medicines for conditions, including cancer, organ transplantation, and autoimmune conditions.  Hepatitis C  Blood testing is recommended for: ? Everyone born from [birth year] through 1964. ? Anyone with known risk factors for hepatitis C.  Sexually transmitted infections (STIs)  You should be screened for sexually transmitted infections (STIs) including gonorrhea and chlamydia if: ? You are sexually active and are younger than 65 years of age. ? You are older than 65 years of age and your health care provider tells you that you are at risk for this type of infection. ? Your sexual activity has changed since you were last screened and you are at an increased risk for chlamydia or gonorrhea. Ask your health care provider if you are at risk.  If you do not have HIV, but are at risk,  it may be recommended that you take a prescription medicine daily to prevent HIV infection. This is called pre-exposure prophylaxis (PrEP). You are considered at risk if: ? You are sexually active and do not regularly use condoms or know the HIV status of your partner(s). ? You take drugs by injection. ? You are sexually active with a partner who has HIV.  Talk with your health care  provider about whether you are at high risk of being infected with HIV. If you choose to begin PrEP, you should first be tested for HIV. You should then be tested every 3 months for as long as you are taking PrEP. Pregnancy  If you are premenopausal and you may become pregnant, ask your health care provider about preconception counseling.  If you may become pregnant, take 400 to 800 micrograms (mcg) of folic acid every day.  If you want to prevent pregnancy, talk to your health care provider about birth control (contraception). Osteoporosis and menopause  Osteoporosis is a disease in which the bones lose minerals and strength with aging. This can result in serious bone fractures. Your risk for osteoporosis can be identified using a bone density scan.  If you are 34 years of age or older, or if you are at risk for osteoporosis and fractures, ask your health care provider if you should be screened.  Ask your health care provider whether you should take a calcium or vitamin D supplement to lower your risk for osteoporosis.  Menopause may have certain physical symptoms and risks.  Hormone replacement therapy may reduce some of these symptoms and risks. Talk to your health care provider about whether hormone replacement therapy is right for you. Follow these instructions at home:  Schedule regular health, dental, and eye exams.  Stay current with your immunizations.  Do not use any tobacco products including cigarettes, chewing tobacco, or electronic cigarettes.  If you are pregnant, do not drink  alcohol.  If you are breastfeeding, limit how much and how often you drink alcohol.  Limit alcohol intake to no more than 1 drink per day for nonpregnant women. One drink equals 12 ounces of beer, 5 ounces of wine, or 1 ounces of hard liquor.  Do not use street drugs.  Do not share needles.  Ask your health care provider for help if you need support or information about quitting drugs.  Tell your health care provider if you often feel depressed.  Tell your health care provider if you have ever been abused or do not feel safe at home. This information is not intended to replace advice given to you by your health care provider. Make sure you discuss any questions you have with your health care provider. Document Released: 11/05/2010 Document Revised: 09/28/2015 Document Reviewed: 01/24/2015 Elsevier Interactive Patient Education  2018 [place] Eating Plan DASH stands for "Dietary Approaches to Stop Hypertension." The DASH eating plan is a healthy eating plan that has been shown to reduce high blood pressure (hypertension). It may also reduce your risk for type 2 diabetes, heart disease, and stroke. The DASH eating plan may also help with weight loss. What are tips for following this plan? General guidelines  Avoid eating more than 2,300 mg (milligrams) of salt (sodium) a day. If you have hypertension, you may need to reduce your sodium intake to 1,500 mg a day.  Limit alcohol intake to no more than 1 drink a day for nonpregnant women and 2 drinks a day for men. One drink equals 12 oz of beer, 5 oz of wine, or 1 oz of hard liquor.  Work with your health care provider to maintain a healthy body weight or to lose weight. Ask what an ideal weight is for you.  Get at least 30 minutes of exercise that causes your heart to beat faster (aerobic exercise) most days of the week. Activities may include walking, swimming, or biking.  Work with your health care provider or diet and  nutrition specialist (dietitian) to adjust your eating plan to your individual calorie needs. Reading food labels  Check food labels for the amount of sodium per serving. Choose foods with less than 5 percent of the Daily Value of sodium. Generally, foods with less than 300 mg of sodium per serving fit into this eating plan.  To find whole grains, look for the word "whole" as the first word in the ingredient list. Shopping  Buy products labeled as "low-sodium" or "no salt added."  Buy fresh foods. Avoid canned foods and premade or frozen meals. Cooking  Avoid adding salt when cooking. Use salt-free seasonings or herbs instead of table salt or sea salt. Check with your health care provider or pharmacist before using salt substitutes.  Do not fry foods. Cook foods using healthy methods such as baking, boiling, grilling, and broiling instead.  Cook with heart-healthy oils, such as olive, canola, soybean, or sunflower oil. Meal planning   Eat a balanced diet that includes: ? 5 or more servings of fruits and vegetables each day. At each meal, try to fill half of your plate with fruits and vegetables. ? Up to 6-8 servings of whole grains each day. ? Less than 6 oz of lean meat, poultry, or fish each day. A 3-oz serving of meat is about the same size as a deck of cards. One egg equals 1 oz. ? 2 servings of low-fat dairy each day. ? A serving of nuts, seeds, or beans 5 times each week. ? Heart-healthy fats. Healthy fats called Omega-3 fatty acids are found in foods such as flaxseeds and coldwater fish, like sardines, salmon, and mackerel.  Limit how much you eat of the following: ? Canned or prepackaged foods. ? Food that is high in trans fat, such as fried foods. ? Food that is high in saturated fat, such as fatty meat. ? Sweets, desserts, sugary drinks, and other foods with added sugar. ? Full-fat dairy products.  Do not salt foods before eating.  Try to eat at least 2 vegetarian  meals each week.  Eat more home-cooked food and less restaurant, buffet, and fast food.  When eating at a restaurant, ask that your food be prepared with less salt or no salt, if possible. What foods are recommended? The items listed may not be a complete list. Talk with your dietitian about what dietary choices are best for you. Grains Whole-grain or whole-wheat bread. Whole-grain or whole-wheat pasta. Brown rice. [place]. Bulgur. Whole-grain and low-sodium cereals. Pita bread. Low-fat, low-sodium crackers. Whole-wheat flour tortillas. Vegetables Fresh or frozen vegetables (raw, steamed, roasted, or grilled). Low-sodium or reduced-sodium tomato and vegetable juice. Low-sodium or reduced-sodium tomato sauce and tomato paste. Low-sodium or reduced-sodium canned vegetables. Fruits All fresh, dried, or frozen fruit. Canned fruit in natural juice (without added sugar). Meat and other protein foods Skinless chicken or [place]. Ground chicken or [place]. Pork with fat trimmed off. Fish and seafood. Egg whites. Dried beans, peas, or lentils. Unsalted nuts, nut butters, and seeds. Unsalted canned beans. Lean cuts of beef with fat trimmed off. Low-sodium, lean deli meat. Dairy Low-fat (1%) or fat-free (skim) milk. Fat-free, low-fat, or reduced-fat cheeses. Nonfat, low-sodium ricotta or cottage cheese. Low-fat or nonfat yogurt. Low-fat, low-sodium cheese. Fats and oils Soft margarine without trans fats. Vegetable oil. Low-fat, reduced-fat, or light mayonnaise and salad dressings (reduced-sodium). Canola, safflower, olive, soybean, and sunflower oils. Avocado. Seasoning and other foods Herbs. Spices. Seasoning mixes  without salt. Unsalted popcorn and pretzels. Fat-free sweets. What foods are not recommended? The items listed may not be a complete list. Talk with your dietitian about what dietary choices are best for you. Grains Baked goods made with fat, such as croissants, muffins, or some  breads. Dry pasta or rice meal packs. Vegetables Creamed or fried vegetables. Vegetables in a cheese sauce. Regular canned vegetables (not low-sodium or reduced-sodium). Regular canned tomato sauce and paste (not low-sodium or reduced-sodium). Regular tomato and vegetable juice (not low-sodium or reduced-sodium). Angie Fava. Olives. Fruits Canned fruit in a light or heavy syrup. Fried fruit. Fruit in cream or butter sauce. Meat and other protein foods Fatty cuts of meat. Ribs. Fried meat. Berniece Salines. Sausage. Bologna and other processed lunch meats. Salami. Fatback. Hotdogs. Bratwurst. Salted nuts and seeds. Canned beans with added salt. Canned or smoked fish. Whole eggs or egg yolks. Chicken or [place] with skin. Dairy Whole or 2% milk, cream, and half-and-half. Whole or full-fat cream cheese. Whole-fat or sweetened yogurt. Full-fat cheese. Nondairy creamers. Whipped toppings. Processed cheese and cheese spreads. Fats and oils Butter. Stick margarine. Lard. Shortening. Ghee. Bacon fat. Tropical oils, such as coconut, palm kernel, or palm oil. Seasoning and other foods Salted popcorn and pretzels. Onion salt, garlic salt, seasoned salt, table salt, and sea salt. Worcestershire sauce. Tartar sauce. Barbecue sauce. Teriyaki sauce. Soy sauce, including reduced-sodium. Steak sauce. Canned and packaged gravies. Fish sauce. Oyster sauce. Cocktail sauce. Horseradish that you find on the shelf. Ketchup. Mustard. Meat flavorings and tenderizers. Bouillon cubes. Hot sauce and Tabasco sauce. Premade or packaged marinades. Premade or packaged taco seasonings. Relishes. Regular salad dressings. Where to find more information:  National Heart, Lung, and [place]: https://wilson-eaton.com/  American Heart Association: www.heart.org Summary  The DASH eating plan is a healthy eating plan that has been shown to reduce high blood pressure (hypertension). It may also reduce your risk for type 2 diabetes, heart disease, and  stroke.  With the DASH eating plan, you should limit salt (sodium) intake to 2,300 mg a day. If you have hypertension, you may need to reduce your sodium intake to 1,500 mg a day.  When on the DASH eating plan, aim to eat more fresh fruits and vegetables, whole grains, lean proteins, low-fat dairy, and heart-healthy fats.  Work with your health care provider or diet and nutrition specialist (dietitian) to adjust your eating plan to your individual calorie needs. This information is not intended to replace advice given to you by your health care provider. Make sure you discuss any questions you have with your health care provider. Document Released: 04/11/2011 Document Revised: 04/15/2016 Document Reviewed: 04/15/2016 Elsevier Interactive Patient Education  2017 [employer].

## 2017-04-07 DIAGNOSIS — H01025 Squamous blepharitis left lower eyelid: Secondary | ICD-10-CM | POA: Diagnosis not present

## 2017-04-07 DIAGNOSIS — H04123 Dry eye syndrome of bilateral lacrimal glands: Secondary | ICD-10-CM | POA: Diagnosis not present

## 2017-04-07 DIAGNOSIS — H01021 Squamous blepharitis right upper eyelid: Secondary | ICD-10-CM | POA: Diagnosis not present

## 2017-04-07 DIAGNOSIS — H40013 Open angle with borderline findings, low risk, bilateral: Secondary | ICD-10-CM | POA: Diagnosis not present

## 2017-04-07 DIAGNOSIS — H01022 Squamous blepharitis right lower eyelid: Secondary | ICD-10-CM | POA: Diagnosis not present

## 2017-04-07 DIAGNOSIS — H01024 Squamous blepharitis left upper eyelid: Secondary | ICD-10-CM | POA: Diagnosis not present

## 2017-04-07 DIAGNOSIS — H2513 Age-related nuclear cataract, bilateral: Secondary | ICD-10-CM | POA: Diagnosis not present

## 2017-05-14 MED FILL — ROSUVASTATIN CALCIUM 10 MG: 10 | 30 days supply | Qty: 30 | Fill #1

## 2017-05-14 MED FILL — SERTRALINE HCL 100 MG TAB: 100 | 30 days supply | Qty: 30 | Fill #1

## 2017-05-14 MED FILL — HYDROCHLOROTHIAZIDE 25 MG T: 25 | 90 days supply | Qty: 90 | Fill #1

## 2017-05-15 ENCOUNTER — Other Ambulatory Visit: Payer: Self-pay | Admitting: *Deleted

## 2017-05-15 DIAGNOSIS — E782 Mixed hyperlipidemia: Secondary | ICD-10-CM

## 2017-05-15 DIAGNOSIS — F325 Major depressive disorder, single episode, in full remission: Secondary | ICD-10-CM

## 2017-05-15 DIAGNOSIS — I10 Essential (primary) hypertension: Secondary | ICD-10-CM

## 2017-05-15 MED ORDER — SERTRALINE HCL 100 MG PO TABS: 100.0000 mg | ORAL_TABLET | Freq: Every day | ORAL | 3 refills | Status: DC

## 2017-05-15 MED ORDER — ROSUVASTATIN CALCIUM 10 MG PO TABS: 10.0000 mg | ORAL_TABLET | Freq: Every day | ORAL | 3 refills | Status: DC

## 2017-05-15 MED ORDER — HYDROCHLOROTHIAZIDE 25 MG PO TABS: 25.0000 mg | ORAL_TABLET | Freq: Every day | ORAL | 3 refills | Status: DC

## 2017-05-15 NOTE — Telephone Encounter (Signed)
Patient requested 90 day supply due to cost

## 2017-05-26 ENCOUNTER — Other Ambulatory Visit: Payer: Self-pay | Admitting: Nurse Practitioner

## 2017-05-26 DIAGNOSIS — Z1231 Encounter for screening mammogram for malignant neoplasm of breast: Secondary | ICD-10-CM

## 2017-06-17 ENCOUNTER — Ambulatory Visit
Admission: RE | Admit: 2017-06-17 | Discharge: 2017-06-17 | Disposition: A | Discharge status: Home / Self Care | Payer: 59 | Source: Ambulatory Visit | Attending: Nurse Practitioner | Admitting: Nurse Practitioner

## 2017-06-17 DIAGNOSIS — Z1231 Encounter for screening mammogram for malignant neoplasm of breast: Secondary | ICD-10-CM

## 2017-06-17 DIAGNOSIS — Z780 Asymptomatic menopausal state: Secondary | ICD-10-CM | POA: Diagnosis not present

## 2017-06-17 DIAGNOSIS — M85851 Other specified disorders of bone density and structure, right thigh: Secondary | ICD-10-CM | POA: Diagnosis not present

## 2017-06-17 DIAGNOSIS — E2839 Other primary ovarian failure: Secondary | ICD-10-CM

## 2017-06-17 MED FILL — SERTRALINE HCL 100 MG TAB: 100 | 90 days supply | Qty: 90 | Fill #0

## 2017-06-19 ENCOUNTER — Other Ambulatory Visit: Payer: Self-pay

## 2017-06-19 ENCOUNTER — Ambulatory Visit: Payer: Self-pay

## 2017-06-23 ENCOUNTER — Telehealth: Payer: Self-pay

## 2017-06-23 NOTE — Telephone Encounter (Signed)
-----   Message from Sharon SellerJessica K Eubanks, NP sent at 06/19/2017 10:10 AM EST ----- Noted to have osteopenia- low bone mass on bone density- to start caltrate with D 600/400 twice a day with weight bearing activity

## 2017-06-23 NOTE — Telephone Encounter (Signed)
Patient's medication list has been updated to reflect recommendations of provider. Patient verbalized understanding.

## 2017-09-19 ENCOUNTER — Encounter: Payer: Self-pay | Admitting: Internal Medicine

## 2017-09-19 ENCOUNTER — Ambulatory Visit (INDEPENDENT_AMBULATORY_CARE_PROVIDER_SITE_OTHER): Payer: 59 | Admitting: Internal Medicine

## 2017-09-19 VITALS — BP 118/78 | HR 61 | Temp 98.4°F | Ht 63.0 in | Wt 194.8 lb

## 2017-09-19 DIAGNOSIS — Z79899 Other long term (current) drug therapy: Secondary | ICD-10-CM | POA: Diagnosis not present

## 2017-09-19 DIAGNOSIS — L723 Sebaceous cyst: Secondary | ICD-10-CM | POA: Diagnosis not present

## 2017-09-19 DIAGNOSIS — M25551 Pain in right hip: Secondary | ICD-10-CM | POA: Diagnosis not present

## 2017-09-19 DIAGNOSIS — A6009 Herpesviral infection of other urogenital tract: Secondary | ICD-10-CM | POA: Diagnosis not present

## 2017-09-19 DIAGNOSIS — I10 Essential (primary) hypertension: Secondary | ICD-10-CM

## 2017-09-19 DIAGNOSIS — E782 Mixed hyperlipidemia: Secondary | ICD-10-CM

## 2017-09-19 DIAGNOSIS — R7301 Impaired fasting glucose: Secondary | ICD-10-CM | POA: Diagnosis not present

## 2017-09-19 DIAGNOSIS — F325 Major depressive disorder, single episode, in full remission: Secondary | ICD-10-CM | POA: Diagnosis not present

## 2017-09-19 MED ORDER — CALCIUM CARBONATE-VITAMIN D 600-400 MG-UNIT PO TABS: 1.0000 | ORAL_TABLET | Freq: Two times a day (BID) | ORAL | 3 refills | Status: DC

## 2017-09-19 MED ORDER — SERTRALINE HCL 100 MG PO TABS: 100.0000 mg | ORAL_TABLET | Freq: Every day | ORAL | 3 refills | Status: DC

## 2017-09-19 MED ORDER — VALACYCLOVIR HCL 500 MG PO TABS: 500.0000 mg | ORAL_TABLET | Freq: Every day | ORAL | 3 refills | Status: DC

## 2017-09-19 MED ORDER — HYDROCHLOROTHIAZIDE 25 MG PO TABS: 25.0000 mg | ORAL_TABLET | Freq: Every day | ORAL | 3 refills | Status: DC

## 2017-09-19 MED FILL — SERTRALINE HCL 100 MG TAB: 100 | 90 days supply | Qty: 90 | Fill #0

## 2017-09-19 MED FILL — HYDROCHLOROTHIAZIDE 25 MG T: 25 | 90 days supply | Qty: 90 | Fill #0

## 2017-09-19 MED FILL — VALACYCLOVIR HCL 500 MG TAB: 500 | 90 days supply | Qty: 90 | Fill #0

## 2017-09-19 NOTE — Progress Notes (Signed)
Patient ID: Alejandra Crane, female   DOB: January 07, 1951, 67 y.o.   MRN: [id number]   Location:  [facility] OFFICE  Provider: DR Arletha Grippe  Code Status: FULL CODE Goals of Care:  Advanced Directives 03/21/2017  Does Patient Have a Medical Advance Directive? No  Would patient like information on creating a medical advance directive? No - Patient declined     Chief Complaint  Patient presents with  . Medical Management of Chronic Issues    Medical Management of Chronic Issues BP, Depression and Cholesterol. Patient stopped taking Crestor 3-4 months ago due to Hip pain. Pain has decreased some since stopping it but still has hip pain.     HPI: Patient is a 67 y.o. female seen today for medical management of chronic diseases.  She reports feeling well overall. She continues to work. She stopped taking statin due to right hip pain. Her pain is a little better but did not resolve completely. She admits to being noncompliant with Ca w D. She restarted valtrex daily due to recurrent exacerbations of genital herpes.  She is c/a enlarging bump on left occiput that has been present since age 56. No pain. No f/c. No d/c.   Hyperlipidemia - diet controlled. She stopped her statin on her own due to hip pain. FHx (+) MI (mother)  Obesity - slowly improving; BMI 34.52. Weight down 6 lbs since Oct 2018.  HTN - stable on HCTZ  MDD - mood stable on sertraline  She relocated from [place] area in 2017. She is a psychiatric nurse.    Past Medical History:  Diagnosis Date  . Arthritis   . Depression   . Diverticulosis    noted on colonscopy   . Elevated cholesterol   . H/O mammogram    2015, 2016  . 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 High blood pressure   . History of esophagogastroduodenoscopy (EGD)    2014, 2016  . History of PFTs 10/26/2012  . Lower GI bleed 01/19/2015   Related to mobic 15 mg   . Mitral valve prolapse 09/06/1996  . Osteoarthritis of both knees     Past Surgical History:    Procedure Laterality Date  . BREAST EXCISIONAL BIOPSY Left    benign  . BREAST SURGERY  05/20/2007   [facility], Dr.William Purkert   . COLONOSCOPY     2014, 2016 Dr.Bajani Manalo, [place]      reports that she has quit smoking. She has a 5.00 pack-year smoking history. She has never used smokeless tobacco. She reports that she drinks alcohol. She reports that she does not use drugs. Social History   Socioeconomic History  . Marital status: Divorced    Spouse name: Not on file  . Number of children: Not on file  . Years of education: Not on file  . Highest education level: Not on file  Occupational History  . Not on file  Social Needs  . Financial resource strain: Not on file  . Food insecurity:    Worry: Not on file    Inability: Not on file  . Transportation needs:    Medical: Not on file    Non-medical: Not on file  Tobacco Use  . Smoking status: Former Smoker    Packs/day: 0.25    Years: 20.00    Pack years: 5.00  . Smokeless tobacco: Never Used  Substance and Sexual Activity  . Alcohol use: Yes    Comment: Occasionally red wine   .  Drug use: No  . Sexual activity: Not Currently  Lifestyle  . Physical activity:    Days per week: Not on file    Minutes per session: Not on file  . Stress: Not on file  Relationships  . Social connections:    Talks on phone: Not on file    Gets together: Not on file    Attends religious service: Not on file    Active member of club or organization: Not on file    Attends meetings of clubs or organizations: Not on file    Relationship status: Not on file  . Intimate partner violence:    Fear of current or ex partner: Not on file    Emotionally abused: Not on file    Physically abused: Not on file    Forced sexual activity: Not on file  Other Topics Concern  . Not on file  Social History Narrative   Diet: Regular       Caffeine: Yes, infrequently when working       Married, if yes what year: Divorced, married in  1974      Do you live in a house, apartment, assisted living, [place], trailer, ect: House, 1 stories, one person       Pets: None      Current/Past profession:       Exercise: Yes, walking, hiking, and gym          Living Will: No   DNR: No   POA/HPOA: No      Questions below not in new patient packet    Functional Status:   Do you have difficulty bathing or dressing yourself?   Do you have difficulty preparing food or eating?   Do you have difficulty managing your medications?   Do you have difficulty managing your finances?   Do you have difficulty affording your medications?    Family History  Problem Relation Age of Onset  . Heart attack Mother   . CVA Mother   . Diabetes Mother   . Congestive Heart Failure Mother   . Stroke Mother   . Heart disease Mother   . Lung cancer Sister 35  . Cancer Brother        GI or colon cancer  . Neurologic Disorder Son   . Cancer Other        Niece-colon cancer  . Diabetes Other        Nephew    Allergies  Allergen Reactions  . Mobic [Meloxicam]     GI bleed    Outpatient Encounter Medications as of 09/19/2017  Medication Sig  . hydrochlorothiazide (HYDRODIURIL) 25 MG tablet Take 1 tablet (25 mg total) by mouth daily.  . sertraline (ZOLOFT) 100 MG tablet Take 1 tablet (100 mg total) by mouth daily.  . valACYclovir (VALTREX) 500 MG tablet Take 1 tablet (500 mg total) by mouth as needed.  . Calcium Carbonate-Vitamin D (CALTRATE 600+D) 600-400 MG-UNIT tablet Take 1 tablet by mouth 2 (two) times daily.  . rosuvastatin (CRESTOR) 10 MG tablet Take 1 tablet (10 mg total) by mouth daily. (Patient not taking: Reported on 09/19/2017)   No facility-administered encounter medications on file as of 09/19/2017.     Review of Systems:  Review of Systems  Musculoskeletal: Positive for arthralgias.  Skin:       Left head lump  All other systems reviewed and are negative.   Health Maintenance  Topic Date Due  . PAP SMEAR  05/06/2017    .  INFLUENZA VACCINE  12/04/2017  . MAMMOGRAM  06/18/2019  . COLONOSCOPY  05/06/2024  . TETANUS/TDAP  10/17/2024  . DEXA SCAN  Completed  . Hepatitis C Screening  Completed  . HIV Screening  Completed  . PNA vac Low Risk Adult  Completed    Physical Exam: Vitals:   09/19/17 0918  BP: 118/78  Pulse: 61  Temp: 98.4 F (36.9 C)  TempSrc: Oral  SpO2: 97%  Weight: 194 lb 12.8 oz (88.4 kg)  Height: '5\' 3"'  (1.6 m)   Body mass index is 34.51 kg/m. Physical Exam  Constitutional: She is oriented to person, place, and time. She appears well-developed and well-nourished.  HENT:  Head:    Mouth/Throat: Oropharynx is clear and moist. No oropharyngeal exudate.  MMM; no oral thrush  Eyes: Pupils are equal, round, and reactive to light. No scleral icterus.  Neck: Neck supple. Carotid bruit is not present. No tracheal deviation present. No thyromegaly present.  Cardiovascular: Normal rate, regular rhythm and intact distal pulses. Exam reveals no gallop and no friction rub.  Murmur (1/6 SEM) heard. No LE edema b/l. no calf TTP.   Pulmonary/Chest: Effort normal and breath sounds normal. No stridor. No respiratory distress. She has no wheezes. She has no rales.  Abdominal: Soft. Normal appearance and bowel sounds are normal. She exhibits no distension and no mass. There is no hepatomegaly. There is no tenderness. There is no rigidity, no rebound and no guarding. No hernia.  Musculoskeletal: She exhibits edema.  Lymphadenopathy:    She has no cervical adenopathy.  Neurological: She is alert and oriented to person, place, and time. She has normal reflexes.  Skin: Skin is warm and dry. No rash noted.  Psychiatric: She has a normal mood and affect. Her behavior is normal. Judgment and thought content normal.    Labs reviewed: Basic Metabolic Panel: Recent Labs    02/19/17 1027  NA 141  K 3.9  CL 106  CO2 29  GLUCOSE 85  BUN 23  CREATININE 0.94  CALCIUM 9.2   Liver Function  Tests: Recent Labs    02/19/17 1027  AST 17  ALT 10  BILITOT 0.4  PROT 7.0   No results for input(s): LIPASE, AMYLASE in the last 8760 hours. No results for input(s): AMMONIA in the last 8760 hours. CBC: Recent Labs    02/19/17 1027  WBC 7.0  NEUTROABS 3,941  HGB 12.2  HCT 36.9  MCV 92.3  PLT 246   Lipid Panel: Recent Labs    02/19/17 1027  CHOL 177  HDL 83  LDLCALC 81  TRIG 54  CHOLHDL 2.1   Lab Results  Component Value Date   HGBA1C 5.6 12/25/2015    Procedures since last visit: No results found.  Assessment/Plan   ICD-10-CM   1. Sebaceous cyst L72.3   2. Mixed hyperlipidemia E78.2 Lipid Panel  3. Essential hypertension I10 CMP with eGFR([facility])    hydrochlorothiazide (HYDRODIURIL) 25 MG tablet  4. Right hip pain M25.551   5. Depression, major, single episode, complete remission (HCC) F32.5 sertraline (ZOLOFT) 100 MG tablet  6. High risk medication use Z79.899 CMP with eGFR([facility])  7. Impaired fasting glucose R73.01 Hemoglobin A1c  8. Herpes genitalis in women A60.09 valACYclovir (VALTREX) 500 MG tablet     If cholesterol returns elevated, t/c reduced crestor dose to 30m 3 times per week  Will call with lab results  Continue current medications as ordered  Continue diet and exercise program  Follow up in  6 mos for fasting CPE/ECG with Gaspar Cola S. Perlie Gold  [facility] [address] [place], [place] [address] [phone] Cell (Monday-Friday 8 AM - 5 PM) [phone] After 5 PM and follow prompts

## 2017-09-19 NOTE — Patient Instructions (Addendum)
Will call with lab results  Continue current medications as ordered  Continue diet and exercise program  Follow up in 6 mos for fasting CPE/ECG with Shanda Bumps

## 2017-09-20 LAB — COMPLETE METABOLIC PANEL WITH GFR
AG Ratio: 1.3 (calc) (ref 1.0–2.5)
ALBUMIN MSPROF: 4 g/dL (ref 3.6–5.1)
ALT: 12 U/L (ref 6–29)
AST: 20 U/L (ref 10–35)
Alkaline phosphatase (APISO): 81 U/L (ref 33–130)
BILIRUBIN TOTAL: 0.4 mg/dL (ref 0.2–1.2)
BUN: 18 mg/dL (ref 7–25)
CALCIUM: 9.4 mg/dL (ref 8.6–10.4)
CHLORIDE: 106 mmol/L (ref 98–110)
CO2: 30 mmol/L (ref 20–32)
CREATININE: 0.95 mg/dL (ref 0.50–0.99)
GFR, EST AFRICAN AMERICAN: 73 mL/min/{1.73_m2} (ref >=60)
GFR, Est Non African American: 63 mL/min/{1.73_m2} (ref >=60)
GLUCOSE: 89 mg/dL (ref 65–99)
Globulin: 3.2 g/dL (calc) (ref 1.9–3.7)
Potassium: 3.8 mmol/L (ref 3.5–5.3)
Sodium: 143 mmol/L (ref 135–146)
TOTAL PROTEIN: 7.2 g/dL (ref 6.1–8.1)

## 2017-09-20 LAB — LIPID PANEL
CHOL/HDL RATIO: 4.1 (calc) (ref <5.0)
Cholesterol: 294 mg/dL — ABNORMAL HIGH (ref <200)
HDL: 72 mg/dL (ref >50)
LDL CHOLESTEROL (CALC): 203 mg/dL — AB
Non-HDL Cholesterol (Calc): 222 mg/dL (calc) — ABNORMAL HIGH (ref <130)
TRIGLYCERIDES: 80 mg/dL (ref <150)

## 2017-09-20 LAB — HEMOGLOBIN A1C
EAG (MMOL/L): 6.3 (calc)
Hgb A1c MFr Bld: 5.6 % of total Hgb (ref <5.7)
Mean Plasma Glucose: 114 (calc)

## 2017-09-22 ENCOUNTER — Other Ambulatory Visit: Payer: Self-pay

## 2017-09-22 MED ORDER — ROSUVASTATIN CALCIUM 5 MG PO TABS: 5.0000 mg | ORAL_TABLET | ORAL | 4 refills | Status: DC

## 2017-09-22 MED FILL — ROSUVASTATIN CALCIUM 5 MG T: 5 | 30 days supply | Qty: 15 | Fill #0

## 2017-11-17 ENCOUNTER — Other Ambulatory Visit: Payer: Self-pay

## 2017-11-17 ENCOUNTER — Encounter (HOSPITAL_COMMUNITY): Payer: Self-pay | Admitting: Emergency Medicine

## 2017-11-17 ENCOUNTER — Ambulatory Visit (HOSPITAL_COMMUNITY)
Admission: EM | Admit: 2017-11-17 | Discharge: 2017-11-17 | Disposition: A | Discharge status: Home / Self Care | Payer: 59 | Attending: Family Medicine | Admitting: Family Medicine

## 2017-11-17 DIAGNOSIS — L089 Local infection of the skin and subcutaneous tissue, unspecified: Secondary | ICD-10-CM

## 2017-11-17 DIAGNOSIS — L729 Follicular cyst of the skin and subcutaneous tissue, unspecified: Secondary | ICD-10-CM | POA: Diagnosis not present

## 2017-11-17 MED ORDER — DOXYCYCLINE HYCLATE 100 MG PO CAPS: 100.0000 mg | ORAL_CAPSULE | Freq: Two times a day (BID) | ORAL | 0 refills | Status: AC

## 2017-11-17 MED ORDER — IBUPROFEN 400 MG PO TABS: 400.0000 mg | ORAL_TABLET | Freq: Four times a day (QID) | ORAL | 0 refills | Status: DC | PRN

## 2017-11-17 MED FILL — DOXYCYCLINE HYCLATE 100 MG: 100 | 10 days supply | Qty: 20 | Fill #0

## 2017-11-17 NOTE — ED Provider Notes (Signed)
[facility]    CSN: [id number] Arrival date & time: 11/17/17  4098     History   Chief Complaint Chief Complaint  Patient presents with  . Cyst    HPI Alejandra Crane is a 67 y.o. female.   Alejandra Crane presents with complaints of pain to cyst to back of her neck. States has had a small area to her neck for 40 years. Has been increasing in size for the past few months. Really increased over the past few weeks and over the weekend got larger and more tender. She poked at it with a needle and thick pus drained. Still painful and irritated. No previous similar. No fevers. No history of mrsa. Hx of depression, htn, mitral valve prolapse, OA.    ROS per HPI.      Past Medical History:  Diagnosis Date  . Arthritis   . Depression   . Diverticulosis    noted on colonscopy   . Elevated cholesterol   . H/O mammogram    2015, 2016  . 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 High blood pressure   . History of esophagogastroduodenoscopy (EGD)    2014, 2016  . History of PFTs 10/26/2012  . Lower GI bleed 01/19/2015   Related to mobic 15 mg   . Mitral valve prolapse 09/06/1996  . Osteoarthritis of both knees     Patient Active Problem List   Diagnosis Date Noted  . Essential hypertension 02/19/2017  . Mixed hyperlipidemia 02/19/2017  . Depression, major, single episode, complete remission (HCC) 02/19/2017  . Herpes genitalis in women 02/19/2017    Past Surgical History:  Procedure Laterality Date  . BREAST EXCISIONAL BIOPSY Left    benign  . BREAST SURGERY  05/20/2007   [facility], Dr.William Purkert   . COLONOSCOPY     2014, 2016 Dr.Bajani Manalo, Dr.Mahmood Abedi     OB History   None      Home Medications    Prior to Admission medications   Medication Sig Start Date End Date Taking? Authorizing Provider  Calcium Carbonate-Vitamin D (CALTRATE 600+D) 600-400 MG-UNIT tablet Take 1 tablet by mouth 2 (two) times daily. 09/19/17   Kirt Boys, DO  doxycycline  (VIBRAMYCIN) 100 MG capsule Take 1 capsule (100 mg total) by mouth 2 (two) times daily for 10 days. 11/17/17 11/27/17  Georgetta Haber, NP  hydrochlorothiazide (HYDRODIURIL) 25 MG tablet Take 1 tablet (25 mg total) by mouth daily. 09/19/17   Kirt Boys, DO  ibuprofen (ADVIL,MOTRIN) 400 MG tablet Take 1 tablet (400 mg total) by mouth every 6 (six) hours as needed. 11/17/17   Georgetta Haber, NP  rosuvastatin (CRESTOR) 5 MG tablet Take 1 tablet (5 mg total) by mouth 3 (three) times a week. 09/22/17   Kirt Boys, DO  sertraline (ZOLOFT) 100 MG tablet Take 1 tablet (100 mg total) by mouth daily. 09/19/17   Kirt Boys, DO  valACYclovir (VALTREX) 500 MG tablet Take 1 tablet (500 mg total) by mouth daily. 09/19/17   Kirt Boys, DO    Family History Family History  Problem Relation Age of Onset  . Heart attack Mother   . CVA Mother   . Diabetes Mother   . Congestive Heart Failure Mother   . Stroke Mother   . Heart disease Mother   . Lung cancer Sister 2  . Cancer Brother        GI or colon cancer  . Neurologic Disorder Son   . Cancer Other  Niece-colon cancer  . Diabetes Other        Nephew    Social History Social History   Tobacco Use  . Smoking status: Former Smoker    Packs/day: 0.25    Years: 20.00    Pack years: 5.00  . Smokeless tobacco: Never Used  Substance Use Topics  . Alcohol use: Yes    Comment: Occasionally red wine   . Drug use: No     Allergies   Mobic [meloxicam]   Review of Systems Review of Systems   Physical Exam Triage Vital Signs ED Triage Vitals  Enc Vitals Group     BP 11/17/17 0909 139/74     Pulse Rate 11/17/17 0909 71     Resp 11/17/17 0909 16     Temp 11/17/17 0909 98.4 F (36.9 C)     Temp Source 11/17/17 0909 Oral     SpO2 11/17/17 0909 96 %     Weight --      Height --      Head Circumference --      Peak Flow --      Pain Score 11/17/17 0917 6     Pain Loc --      Pain Edu? --      Excl. in GC? --    No  data found.  Updated Vital Signs BP 139/74 (BP Location: Right Arm)   Pulse 71   Temp 98.4 F (36.9 C) (Oral)   Resp 16   SpO2 96%    Physical Exam  Constitutional: She is oriented to person, place, and time. She appears well-developed and well-nourished. No distress.  Cardiovascular: Normal rate, regular rhythm and normal heart sounds.  Pulmonary/Chest: Effort normal and breath sounds normal.  Neurological: She is alert and oriented to person, place, and time.  Skin: Skin is warm and dry.     2 cm fluctuance cyst to left posterior/lateral neck; no induration or surrounding redness, tenderness; scabbing at area from previous drainage, no drainage with pressure      UC Treatments / Results  Labs (all labs ordered are listed, but only abnormal results are displayed) Labs Reviewed - No data to display  EKG None  Radiology No results found.  Procedures Incision and Drainage Date/Time: 11/17/2017 10:05 AM Performed by: Georgetta Haber, NP Authorized by: Eustace Moore, MD   Consent:    Consent obtained:  Verbal   Consent given by:  Patient   Risks discussed:  Bleeding, incomplete drainage, pain and infection   Alternatives discussed:  No treatment, alternative treatment, observation and referral Location:    Type:  Cyst   Size:  2   Location:  Neck   Neck location:  L posterior Pre-procedure details:    Skin preparation:  Betadine Anesthesia (see MAR for exact dosages):    Anesthesia method:  Local infiltration   Local anesthetic:  Lidocaine 2% w/o epi Procedure type:    Complexity:  Simple Procedure details:    Incision types:  Single straight   Scalpel blade:  11   Wound management:  Probed and deloculated   Drainage:  Purulent   Drainage amount:  Copious   Wound treatment:  Wound left open   Packing materials:  None Post-procedure details:    Patient tolerance of procedure:  Tolerated well, no immediate complications   (including critical care  time)  Medications Ordered in UC Medications - No data to display  Initial Impression / Assessment and Plan / UC  Course  I have reviewed the triage vital signs and the nursing notes.  Pertinent labs & imaging results that were available during my care of the patient were reviewed by me and considered in my medical decision making (see chart for details).     Incised apparent infected cyst. Large thick output produced. Patient tolerated. Has appointment next month with dermatology, encouraged follow up as may need removal of cyst sack at that time if recurrent. Course of antibiotics provided. Wound care discussed. Return precautions provided. Patient verbalized understanding and agreeable to plan.    Final Clinical Impressions(s) / UC Diagnoses   Final diagnoses:  Infected cyst of skin     Discharge Instructions     Keep dressing on for 24 hours.  Warm compresses to promote drainage.  Then may remove. Wash with soap and water daily to keep clean, cover to keep clean. Gauze or bandage. Complete course of antibiotics.   Please follow up with dermatology as you have scheduled as this cyst may need to be completely removed in the future.  If symptoms worsen, develop increased pain, fevers, redness or worsening of drainage, or do not improve in the next week to return to be seen or to follow up with your PCP.     ED Prescriptions    Medication Sig Dispense Auth. Provider   doxycycline (VIBRAMYCIN) 100 MG capsule Take 1 capsule (100 mg total) by mouth 2 (two) times daily for 10 days. 20 capsule Linus MakoBurky, Jase Himmelberger B, NP   ibuprofen (ADVIL,MOTRIN) 400 MG tablet Take 1 tablet (400 mg total) by mouth every 6 (six) hours as needed. 30 tablet Georgetta HaberBurky, Aldeen Riga B, NP     Controlled Substance Prescriptions [place] Controlled Substance Registry consulted? Not Applicable   Georgetta HaberBurky, Shahzad Thomann B, NP 11/17/17 1006

## 2017-11-17 NOTE — Discharge Instructions (Signed)
Keep dressing on for 24 hours.  Warm compresses to promote drainage.  Then may remove. Wash with soap and water daily to keep clean, cover to keep clean. Gauze or bandage. Complete course of antibiotics.   Please follow up with dermatology as you have scheduled as this cyst may need to be completely removed in the future.  If symptoms worsen, develop increased pain, fevers, redness or worsening of drainage, or do not improve in the next week to return to be seen or to follow up with your PCP.

## 2017-11-17 NOTE — ED Triage Notes (Signed)
Patient reports a 40 year history of a pimple on left neck.  In may noticed changes to this "pimple" . Increased in size.  Patient has noticed pain.  Patient has pus drainage.  [place]

## 2017-12-24 DIAGNOSIS — L02811 Cutaneous abscess of head [any part, except face]: Secondary | ICD-10-CM | POA: Diagnosis not present

## 2017-12-31 ENCOUNTER — Other Ambulatory Visit: Payer: Self-pay

## 2017-12-31 DIAGNOSIS — I10 Essential (primary) hypertension: Secondary | ICD-10-CM

## 2017-12-31 DIAGNOSIS — Z79899 Other long term (current) drug therapy: Secondary | ICD-10-CM

## 2017-12-31 DIAGNOSIS — R7301 Impaired fasting glucose: Secondary | ICD-10-CM

## 2017-12-31 DIAGNOSIS — E782 Mixed hyperlipidemia: Secondary | ICD-10-CM

## 2018-01-01 MED FILL — HYDROCHLOROTHIAZIDE 25 MG T: 25 | 90 days supply | Qty: 90 | Fill #1

## 2018-01-01 MED FILL — ROSUVASTATIN CALCIUM 5 MG T: 5 | 90 days supply | Qty: 39 | Fill #1

## 2018-01-01 MED FILL — SERTRALINE HCL 100 MG TAB: 100 | 90 days supply | Qty: 90 | Fill #1

## 2018-03-16 ENCOUNTER — Other Ambulatory Visit: Payer: 59

## 2018-03-16 DIAGNOSIS — I10 Essential (primary) hypertension: Secondary | ICD-10-CM

## 2018-03-16 DIAGNOSIS — R7301 Impaired fasting glucose: Secondary | ICD-10-CM | POA: Diagnosis not present

## 2018-03-16 DIAGNOSIS — Z79899 Other long term (current) drug therapy: Secondary | ICD-10-CM | POA: Diagnosis not present

## 2018-03-16 DIAGNOSIS — E782 Mixed hyperlipidemia: Secondary | ICD-10-CM | POA: Diagnosis not present

## 2018-03-17 LAB — COMPLETE METABOLIC PANEL WITH GFR
AG RATIO: 1.3 (calc) (ref 1.0–2.5)
ALT: 9 U/L (ref 6–29)
AST: 16 U/L (ref 10–35)
Albumin: 3.9 g/dL (ref 3.6–5.1)
Alkaline phosphatase (APISO): 75 U/L (ref 33–130)
BUN: 22 mg/dL (ref 7–25)
CALCIUM: 9.5 mg/dL (ref 8.6–10.4)
CO2: 29 mmol/L (ref 20–32)
Chloride: 107 mmol/L (ref 98–110)
Creat: 0.9 mg/dL (ref 0.50–0.99)
GFR, EST NON AFRICAN AMERICAN: 67 mL/min/{1.73_m2} (ref >=60)
GFR, Est African American: 77 mL/min/{1.73_m2} (ref >=60)
GLUCOSE: 87 mg/dL (ref 65–99)
Globulin: 2.9 g/dL (calc) (ref 1.9–3.7)
POTASSIUM: 3.9 mmol/L (ref 3.5–5.3)
Sodium: 142 mmol/L (ref 135–146)
Total Bilirubin: 0.4 mg/dL (ref 0.2–1.2)
Total Protein: 6.8 g/dL (ref 6.1–8.1)

## 2018-03-17 LAB — LIPID PANEL
Cholesterol: 216 mg/dL — ABNORMAL HIGH (ref <200)
HDL: 71 mg/dL (ref >50)
LDL CHOLESTEROL (CALC): 130 mg/dL — AB
NON-HDL CHOLESTEROL (CALC): 145 mg/dL — AB (ref <130)
Total CHOL/HDL Ratio: 3 (calc) (ref <5.0)
Triglycerides: 61 mg/dL (ref <150)

## 2018-03-17 LAB — HEMOGLOBIN A1C
Hgb A1c MFr Bld: 5.6 % of total Hgb (ref <5.7)
MEAN PLASMA GLUCOSE: 114 (calc)
eAG (mmol/L): 6.3 (calc)

## 2018-03-23 ENCOUNTER — Encounter: Payer: Self-pay | Admitting: Nurse Practitioner

## 2018-03-23 ENCOUNTER — Ambulatory Visit (INDEPENDENT_AMBULATORY_CARE_PROVIDER_SITE_OTHER): Payer: 59 | Admitting: Nurse Practitioner

## 2018-03-23 VITALS — BP 122/74 | HR 64 | Temp 98.1°F | Ht 62.5 in | Wt 196.0 lb

## 2018-03-23 DIAGNOSIS — A6009 Herpesviral infection of other urogenital tract: Secondary | ICD-10-CM

## 2018-03-23 DIAGNOSIS — I10 Essential (primary) hypertension: Secondary | ICD-10-CM

## 2018-03-23 DIAGNOSIS — R7301 Impaired fasting glucose: Secondary | ICD-10-CM | POA: Diagnosis not present

## 2018-03-23 DIAGNOSIS — E782 Mixed hyperlipidemia: Secondary | ICD-10-CM

## 2018-03-23 DIAGNOSIS — Z0000 Encounter for general adult medical examination without abnormal findings: Secondary | ICD-10-CM

## 2018-03-23 DIAGNOSIS — R001 Bradycardia, unspecified: Secondary | ICD-10-CM | POA: Diagnosis not present

## 2018-03-23 DIAGNOSIS — M79671 Pain in right foot: Secondary | ICD-10-CM | POA: Diagnosis not present

## 2018-03-23 DIAGNOSIS — F325 Major depressive disorder, single episode, in full remission: Secondary | ICD-10-CM | POA: Diagnosis not present

## 2018-03-23 MED ORDER — SERTRALINE HCL 100 MG PO TABS: 100.0000 mg | ORAL_TABLET | Freq: Every day | ORAL | 3 refills | Status: DC

## 2018-03-23 MED ORDER — ZOSTER VAC RECOMB ADJUVANTED 50 MCG/0.5ML IM SUSR: 0.5000 mL | Freq: Once | INTRAMUSCULAR | 1 refills | Status: AC

## 2018-03-23 MED ORDER — HYDROCHLOROTHIAZIDE 25 MG PO TABS: 25.0000 mg | ORAL_TABLET | Freq: Every day | ORAL | 3 refills | Status: DC

## 2018-03-23 MED ORDER — VALACYCLOVIR HCL 500 MG PO TABS: 500.0000 mg | ORAL_TABLET | Freq: Every day | ORAL | 3 refills | Status: DC

## 2018-03-23 MED ORDER — ROSUVASTATIN CALCIUM 5 MG PO TABS: 5.0000 mg | ORAL_TABLET | ORAL | 4 refills | Status: DC

## 2018-03-23 NOTE — Progress Notes (Signed)
Provider: [name], NP  Patient Care Team: [name], NP as PCP - General (Geriatric Medicine)  Extended Emergency Contact Information Primary Emergency Contact: [facility] Address: [address]          [place], [place] [address] [place] Home Phone: [phone] Relation: Brother Allergies  Allergen Reactions  . Mobic [Meloxicam]     GI bleed   Code Status: FULL Goals of Care: Advanced Directive information Advanced Directives 03/23/2018  Does Patient Have a Medical Advance Directive? No  Would patient like information on creating a medical advance directive? -     Chief Complaint  Patient presents with  . Annual Exam    Pt is being seen for a physical.   . Medication Refill    all rx pended  . MMSE    29/30: failed clock drawing but pt was able to tell me the correct time    HPI: Patient is a 67 y.o. Crane seen in today for an annual wellness exam.   Major illnesses or hospitalization in the last year- none  Depression screen [facility] 2/9 03/23/2018 03/21/2017 02/19/2017  Decreased Interest 0 0 0  Down, Depressed, Hopeless 0 0 0  PHQ - 2 Score 0 0 0    Fall Risk  03/23/2018 03/21/2017 02/19/2017  Falls in the past year? 0 No No   MMSE - Mini Mental State Exam 03/23/2018 03/21/2017  Orientation to time 5 5  Orientation to Place 5 5  Registration 3 3  Attention/ Calculation 5 5  Recall 3 3  Language- name 2 objects 2 2  Language- repeat 1 1  Language- follow 3 step command 3 3  Language- read & follow direction 1 1  Write a sentence 1 1  Copy design 0 1  Total score 29 30     Health Maintenance  Topic Date Due  . INFLUENZA VACCINE  12/04/2017  . MAMMOGRAM  06/18/2019  . COLONOSCOPY  05/06/2024  . TETANUS/TDAP  10/17/2024  . DEXA SCAN  Completed  . Hepatitis C Screening  Completed  . PNA vac Low Risk Adult  Completed   Last PAP was when she was ~[age], had normal exams throughout the year GYN told her  she was done after her last.   Urinary incontinence? None.   Continues to work as a Engineer, civil (consulting).  Walks frequently.  Exercise weekly.   Diet?attempts to follow healthy diet.    Dentition: every 6 months  Ophthalmologist- yearly   Pain: none.   Works for [employer]- got high dose on Oct   Past Medical History:  Diagnosis Date  . Arthritis   . Depression   . Diverticulosis    noted on colonscopy   . Elevated cholesterol   . H/O mammogram    2015, 2016  . 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 High blood pressure   . History of esophagogastroduodenoscopy (EGD)    2014, 2016  . History of PFTs 10/26/2012  . Lower GI bleed 01/19/2015   Related to mobic 15 mg   . Mitral valve prolapse 09/06/1996  . Osteoarthritis of both knees     Past Surgical History:  Procedure Laterality Date  . BREAST EXCISIONAL BIOPSY Left    benign  . BREAST SURGERY  05/20/2007   [facility], Dr.William Purkert   . COLONOSCOPY     2014, 2016 Dr.Bajani Manalo, Dr.Mahmood Abedi     Social History   Socioeconomic History  . Marital status: Divorced  Spouse name: Not on file  . Number of children: Not on file  . Years of education: Not on file  . Highest education level: Not on file  Occupational History  . Not on file  Social Needs  . Financial resource strain: Not on file  . Food insecurity:    Worry: Not on file    Inability: Not on file  . Transportation needs:    Medical: Not on file    Non-medical: Not on file  Tobacco Use  . Smoking status: Former Smoker    Packs/day: 0.25    Years: 20.00    Pack years: 5.00  . Smokeless tobacco: Never Used  Substance and Sexual Activity  . Alcohol use: Yes    Comment: Occasionally red wine   . Drug use: No  . Sexual activity: Not Currently  Lifestyle  . Physical activity:    Days per week: Not on file    Minutes per session: Not on file  . Stress: Not on file  Relationships  . Social connections:    Talks on phone: Not on file    Gets together: Not  on file    Attends religious service: Not on file    Active member of club or organization: Not on file    Attends meetings of clubs or organizations: Not on file    Relationship status: Not on file  Other Topics Concern  . Not on file  Social History Narrative   Diet: Regular       Caffeine: Yes, infrequently when working       Married, if yes what year: Divorced, married in 1974      Do you live in a house, apartment, assisted living, condo, trailer, ect: House, 1 stories, one person       Pets: None      Current/Past profession:       Exercise: Yes, walking, hiking, and gym          Living Will: No   DNR: No   POA/HPOA: No      Questions below not in new patient packet    Functional Status:   Do you have difficulty bathing or dressing yourself?   Do you have difficulty preparing food or eating?   Do you have difficulty managing your medications?   Do you have difficulty managing your finances?   Do you have difficulty affording your medications?    Family History  Problem Relation Age of Onset  . Heart attack Mother   . CVA Mother   . Diabetes Mother   . Congestive Heart Failure Mother   . Stroke Mother   . Heart disease Mother   . Lung cancer Sister [id number]  . Cancer Brother        GI or colon cancer  . Neurologic Disorder Son   . Cancer Other        Niece-colon cancer  . Diabetes Other        Nephew    Review of Systems:  Review of Systems  Constitutional: Negative for activity change, appetite change, fatigue and unexpected weight change.  HENT: Negative for congestion and hearing loss.   Eyes: Negative.   Respiratory: Negative for cough and shortness of breath.   Cardiovascular: Negative for chest pain, palpitations and leg swelling.  Gastrointestinal: Negative for abdominal pain, constipation and diarrhea.  Genitourinary: Negative for difficulty urinating and dysuria.  Musculoskeletal: Negative for arthralgias and myalgias.       Right lateral  heel  pain when walking  Skin: Negative for color change and wound.  Neurological: Negative for dizziness and weakness.  Psychiatric/Behavioral: Negative for agitation, behavioral problems and confusion.     Allergies as of 03/23/2018      Reactions   Mobic [meloxicam]    GI bleed      Medication List        Accurate as of 03/23/18 11:Alejandra AM. Always use your most recent med list.          Calcium Carbonate-Vitamin D 600-400 MG-UNIT tablet Take 1 tablet by mouth 2 (two) times daily.   hydrochlorothiazide 25 MG tablet Commonly known as:  HYDRODIURIL Take 1 tablet (25 mg total) by mouth daily.   rosuvastatin 5 MG tablet Commonly known as:  CRESTOR Take 1 tablet (5 mg total) by mouth 3 (three) times a week.   sertraline 100 MG tablet Commonly known as:  ZOLOFT Take 1 tablet (100 mg total) by mouth daily.   valACYclovir 500 MG tablet Commonly known as:  VALTREX Take 1 tablet (500 mg total) by mouth daily.         Physical Exam: Vitals:   03/23/18 1049  BP: 122/74  Pulse: 64  Temp: 98.1 F (Alejandra.7 C)  TempSrc: Oral  SpO2: 98%  Weight: 196 lb (88.9 kg)  Height: 5' 2.5" (1.588 m)   Body mass index is 35.28 kg/m. Physical Exam  Constitutional: She is oriented to person, place, and time. She appears well-developed and well-nourished. No distress.  HENT:  Head: Normocephalic and atraumatic.  Mouth/Throat: Oropharynx is clear and moist. No oropharyngeal exudate.  Eyes: Pupils are equal, round, and reactive to light. Conjunctivae are normal.  Neck: Normal range of motion. Neck supple. No thyromegaly present.  Cardiovascular: Normal rate, regular rhythm and normal heart sounds.  Pulmonary/Chest: Effort normal and breath sounds normal.  Abdominal: Soft. Bowel sounds are normal.  Musculoskeletal: Normal range of motion. She exhibits no edema or tenderness.  No reproducible pain to heel.   Lymphadenopathy:    She has no cervical adenopathy.  Neurological: She is alert  and oriented to person, place, and time. She has normal reflexes.  Skin: Skin is warm and dry. She is not diaphoretic.  Psychiatric: She has a normal mood and affect.    Labs reviewed: Basic Metabolic Panel: Recent Labs    09/19/17 1027 03/16/18 1034  NA 143 142  K 3.8 3.9  CL 106 107  CO2 30 29  GLUCOSE 89 87  BUN 18 22  CREATININE 0.95 0.90  CALCIUM 9.4 9.5   Liver Function Tests: Recent Labs    09/19/17 1027 03/16/18 1034  AST 20 16  ALT 12 9  BILITOT 0.4 0.4  PROT 7.2 6.8   No results for input(s): LIPASE, AMYLASE in the last 8760 hours. No results for input(s): AMMONIA in the last 8760 hours. CBC: No results for input(s): WBC, NEUTROABS, HGB, HCT, MCV, PLT in the last 8760 hours. Lipid Panel: Recent Labs    09/19/17 1027 03/16/18 1034  CHOL 294* 216*  HDL 72 71  LDLCALC 203* 130*  TRIG 80 61  CHOLHDL 4.1 3.0   Lab Results  Component Value Date   HGBA1C 5.6 03/16/2018    Procedures: No results found.  Assessment/Plan 1. Essential hypertension -controlled on hctz  - hydrochlorothiazide (HYDRODIURIL) 25 MG tablet; Take 1 tablet (25 mg total) by mouth daily.  Dispense: 90 tablet; Refill: 3 - EKG 12-Lead  2. Depression, major, single episode, complete  remission (HCC) Controlled on zoloft  - sertraline (ZOLOFT) 100 MG tablet; Take 1 tablet (100 mg total) by mouth daily.  Dispense: 90 tablet; Refill: 3  3. Herpes genitalis in women -stable - valACYclovir (VALTREX) 500 MG tablet; Take 1 tablet (500 mg total) by mouth daily.  Dispense: 90 tablet; Refill: 3  4. Impaired fasting glucose A1c at goal at 5.6  5. Mixed hyperlipidemia LDL improved to 130 from previous LDL 203. Encouraged ongoing diet modification with exercise.  - rosuvastatin (CRESTOR) 5 MG tablet; Take 1 tablet (5 mg total) by mouth 3 (three) times a week.  Dispense: 15 tablet; Refill: 4  6. Bradycardia -EKG noted with sinus bradycardia.  - TSH - CBC with  Differential/Platelets  7. Right foot pain - Ambulatory referral to Podiatry for further evaluation   8. Wellness examination  Pt doing well today. The patient was counseled regarding the appropriate use of alcohol, regular self-examination of the breasts on a monthly basis, prevention of dental and periodontal disease, diet, regular sustained exercise for at least 30 minutes 5 times per week, routine screening interval for mammogram as recommended by the American Cancer Society and ACOG, importance of regular PAP smears,  tobacco use,  and recommended schedule for GI hemoccult testing, colonoscopy, cholesterol, thyroid and diabetes screening.  Next appt: 09/14/2018 Janene Harvey. Biagio Borg  [facility] [phone]

## 2018-03-23 NOTE — Patient Instructions (Signed)

## 2018-03-24 LAB — CBC WITH DIFFERENTIAL/PLATELET
BASOS ABS: 52 {cells}/uL (ref 0–200)
Basophils Relative: 0.7 %
EOS ABS: 118 {cells}/uL (ref 15–500)
Eosinophils Relative: 1.6 %
HCT: 39.1 % (ref 35.0–45.0)
HEMOGLOBIN: 12.9 g/dL (ref 11.7–15.5)
Lymphs Abs: 2923 cells/uL (ref 850–3900)
MCH: 30.7 pg (ref 27.0–33.0)
MCHC: 33 g/dL (ref 32.0–36.0)
MCV: 93.1 fL (ref 80.0–100.0)
MONOS PCT: 7.9 %
MPV: 10.4 fL (ref 7.5–12.5)
Neutro Abs: 3722 cells/uL (ref 1500–7800)
Neutrophils Relative %: 50.3 %
Platelets: 277 10*3/uL (ref 140–400)
RBC: 4.2 10*6/uL (ref 3.80–5.10)
RDW: 13.4 % (ref 11.0–15.0)
Total Lymphocyte: 39.5 %
WBC mixed population: 585 cells/uL (ref 200–950)
WBC: 7.4 10*3/uL (ref 3.8–10.8)

## 2018-03-24 LAB — TSH: TSH: 1.47 m[IU]/L (ref 0.40–4.50)

## 2018-03-31 ENCOUNTER — Ambulatory Visit (INDEPENDENT_AMBULATORY_CARE_PROVIDER_SITE_OTHER): Payer: 59

## 2018-03-31 ENCOUNTER — Encounter: Payer: Self-pay | Admitting: Sports Medicine

## 2018-03-31 ENCOUNTER — Ambulatory Visit: Payer: 59 | Admitting: Sports Medicine

## 2018-03-31 VITALS — BP 119/76 | HR 62

## 2018-03-31 DIAGNOSIS — M2142 Flat foot [pes planus] (acquired), left foot: Secondary | ICD-10-CM | POA: Diagnosis not present

## 2018-03-31 DIAGNOSIS — M7661 Achilles tendinitis, right leg: Secondary | ICD-10-CM | POA: Diagnosis not present

## 2018-03-31 DIAGNOSIS — M2141 Flat foot [pes planus] (acquired), right foot: Secondary | ICD-10-CM

## 2018-03-31 DIAGNOSIS — M79671 Pain in right foot: Secondary | ICD-10-CM | POA: Diagnosis not present

## 2018-03-31 DIAGNOSIS — M79672 Pain in left foot: Secondary | ICD-10-CM

## 2018-03-31 DIAGNOSIS — M7750 Other enthesopathy of unspecified foot: Secondary | ICD-10-CM

## 2018-03-31 DIAGNOSIS — M7751 Other enthesopathy of right foot: Secondary | ICD-10-CM

## 2018-03-31 NOTE — Patient Instructions (Signed)
For tennis shoes recommend:  Anne ShutterBrooks Beast Ascis New balance Saucony Can be purchased at [employer] or [employer]  Vionic  SAS Can be purchased at [employer] or [employer]   For work shoes recommend: [employer] boots  Can be purchased at a variety of places or Scientist, product/process development[employer] Market   For Circuit Citycasual/dress shoes recommend: Vionic  Can be purchased at [employer] or [employer]

## 2018-03-31 NOTE — Progress Notes (Signed)
Subjective: Alejandra Crane is a 67 y.o. female patient who presents to office for evaluation of Right> Left heel pain. Patient complains of progressive pain especially over the last year in the Right>Left foot at the Achilles. Ranks pain 4/10 and is now interferring with daily activities. Patient has tried no treatment.  Patient states that she has been picking up her activity with walking and noticed that the pain has been worse with walking however she does not want to stop walking.  And reports that she does notice that her arches have fallen more on the left and is requesting new orthotics.  Patient denies injury or trauma. Patient denies any other pedal complaints.   Review of Systems  All other systems reviewed and are negative.    Patient Active Problem List   Diagnosis Date Noted  . Essential hypertension 02/19/2017  . Mixed hyperlipidemia 02/19/2017  . Depression, major, single episode, complete remission (HCC) 02/19/2017  . Herpes genitalis in women 02/19/2017    Current Outpatient Medications on File Prior to Visit  Medication Sig Dispense Refill  . Calcium Carbonate-Vitamin D (CALTRATE 600+D) 600-400 MG-UNIT tablet Take 1 tablet by mouth 2 (two) times daily. 180 tablet 3  . hydrochlorothiazide (HYDRODIURIL) 25 MG tablet Take 1 tablet (25 mg total) by mouth daily. 90 tablet 3  . rosuvastatin (CRESTOR) 5 MG tablet Take 1 tablet (5 mg total) by mouth 3 (three) times a week. 15 tablet 4  . sertraline (ZOLOFT) 100 MG tablet Take 1 tablet (100 mg total) by mouth daily. 90 tablet 3  . valACYclovir (VALTREX) 500 MG tablet Take 1 tablet (500 mg total) by mouth daily. 90 tablet 3   No current facility-administered medications on file prior to visit.     Allergies  Allergen Reactions  . Mobic [Meloxicam]     GI bleed    Objective:  General: Alert and oriented x3 in no acute distress  Dermatology: No open lesions bilateral lower extremities, no webspace macerations, no  ecchymosis bilateral, all nails x 10 are well manicured.  Vascular: Dorsalis Pedis and Posterior Tibial pedal pulses 1/4, Capillary Fill Time 3 seconds, + pedal hair growth bilateral, no edema bilateral lower extremities, Temperature gradient within normal limits.  Neurology: Gross sensation intact via light touch bilateral, Protective sensation intact  with Semmes Weinstein Monofilament to all pedal sites, Position sense intact, vibratory intact bilateral, Deep tendon reflexes within normal limits bilateral, No babinski sign present bilateral. +/- Tinels sign right/left foot.   Musculoskeletal: Mild tenderness with palpation at insertion of the Achilles on Right>Left, there is calcaneal exostosis with mild soft tissue present and decreased ankle rom with knee extending  vs flexed resembling gastroc equnius bilateral, The achilles tendon feels intact with no nodularity or palpable dell, Thompson sign negative, Subtalar and midtarsal joint range of motion is within normal limits, there is no 1st ray hypermobility or forefoot deformity noted bilateral.   Gait: Antalgic gait with increased heel off right>left.   Xrays  Right Foot    Impression: Normal osseous mineralization. Joint spaces preserved. No fracture/dislocation/boney destruction. Calcaneal spur present. Kager's triangle intact with no obliteration. No soft tissue abnormalities or radiopaque foreign bodies.   Assessment and Plan: Problem List Items Addressed This Visit    None    Visit Diagnoses    Achilles tendinitis of right lower extremity    -  Primary   Relevant Orders   DG Foot Complete Right (Completed)   Pes planus of both feet  Foot pain, bilateral          -Complete examination performed -Xrays reviewed -Discussed treatement options -Rx custom functional foot orthotics patient to see Rick, heel lifts, gentle stretching -Patient to return to office for orthotics or sooner if condition worsens.  Asencion Islamitorya Rayjon Wery,  DPM

## 2018-04-14 DIAGNOSIS — H2513 Age-related nuclear cataract, bilateral: Secondary | ICD-10-CM | POA: Diagnosis not present

## 2018-04-14 DIAGNOSIS — H01021 Squamous blepharitis right upper eyelid: Secondary | ICD-10-CM | POA: Diagnosis not present

## 2018-04-14 DIAGNOSIS — H04123 Dry eye syndrome of bilateral lacrimal glands: Secondary | ICD-10-CM | POA: Diagnosis not present

## 2018-04-14 DIAGNOSIS — H01024 Squamous blepharitis left upper eyelid: Secondary | ICD-10-CM | POA: Diagnosis not present

## 2018-04-14 DIAGNOSIS — H35413 Lattice degeneration of retina, bilateral: Secondary | ICD-10-CM | POA: Diagnosis not present

## 2018-04-14 DIAGNOSIS — H01022 Squamous blepharitis right lower eyelid: Secondary | ICD-10-CM | POA: Diagnosis not present

## 2018-04-14 DIAGNOSIS — H40013 Open angle with borderline findings, low risk, bilateral: Secondary | ICD-10-CM | POA: Diagnosis not present

## 2018-04-14 DIAGNOSIS — H01025 Squamous blepharitis left lower eyelid: Secondary | ICD-10-CM | POA: Diagnosis not present

## 2018-05-18 MED FILL — ROSUVASTATIN CALCIUM 5 MG T: 5 | 90 days supply | Qty: 39 | Fill #0

## 2018-05-18 MED FILL — SHINGRIX 50 MCG SUS: 50 | 1 days supply | Qty: 1 | Fill #0

## 2018-05-18 MED FILL — SERTRALINE HCL 100 MG TAB: 100 | 90 days supply | Qty: 90 | Fill #0

## 2018-05-18 MED FILL — HYDROCHLOROTHIAZIDE 25 MG T: 25 | 90 days supply | Qty: 90 | Fill #0

## 2018-05-18 MED FILL — VALACYCLOVIR HCL 500 MG TAB: 500 | 90 days supply | Qty: 90 | Fill #0

## 2018-08-15 MED FILL — HYDROCHLOROTHIAZIDE 25 MG T: 25 | 90 days supply | Qty: 90 | Fill #0

## 2018-08-15 MED FILL — ROSUVASTATIN CALCIUM 5 MG T: 5 | 34 days supply | Qty: 15 | Fill #0

## 2018-08-15 MED FILL — SERTRALINE HCL 100 MG TAB: 100 | 90 days supply | Qty: 90 | Fill #0

## 2018-08-15 MED FILL — VALACYCLOVIR HCL 500 MG TAB: 500 | 90 days supply | Qty: 90 | Fill #0

## 2018-08-26 DIAGNOSIS — H35413 Lattice degeneration of retina, bilateral: Secondary | ICD-10-CM | POA: Diagnosis not present

## 2018-08-26 DIAGNOSIS — H43812 Vitreous degeneration, left eye: Secondary | ICD-10-CM | POA: Diagnosis not present

## 2018-09-11 ENCOUNTER — Other Ambulatory Visit: Payer: Self-pay

## 2018-09-11 ENCOUNTER — Other Ambulatory Visit: Payer: Self-pay | Admitting: Nurse Practitioner

## 2018-09-11 DIAGNOSIS — E782 Mixed hyperlipidemia: Secondary | ICD-10-CM

## 2018-09-14 ENCOUNTER — Other Ambulatory Visit: Payer: Self-pay

## 2018-09-14 ENCOUNTER — Other Ambulatory Visit: Payer: 59

## 2018-09-14 DIAGNOSIS — E782 Mixed hyperlipidemia: Secondary | ICD-10-CM | POA: Diagnosis not present

## 2018-09-14 LAB — COMPLETE METABOLIC PANEL WITH GFR
AG Ratio: 1.6 (calc) (ref 1.0–2.5)
ALT: 10 U/L (ref 6–29)
AST: 15 U/L (ref 10–35)
Albumin: 3.9 g/dL (ref 3.6–5.1)
Alkaline phosphatase (APISO): 65 U/L (ref 37–153)
BUN: 25 mg/dL (ref 7–25)
CO2: 30 mmol/L (ref 20–32)
Calcium: 9.5 mg/dL (ref 8.6–10.4)
Chloride: 108 mmol/L (ref 98–110)
Creat: 0.92 mg/dL (ref 0.50–0.99)
GFR, Est African American: 75 mL/min/{1.73_m2} (ref >=60)
GFR, Est Non African American: 65 mL/min/{1.73_m2} (ref >=60)
Globulin: 2.5 g/dL (calc) (ref 1.9–3.7)
Glucose, Bld: 89 mg/dL (ref 65–99)
Potassium: 3.6 mmol/L (ref 3.5–5.3)
Sodium: 143 mmol/L (ref 135–146)
Total Bilirubin: 0.3 mg/dL (ref 0.2–1.2)
Total Protein: 6.4 g/dL (ref 6.1–8.1)

## 2018-09-14 LAB — LIPID PANEL
Cholesterol: 203 mg/dL — ABNORMAL HIGH (ref <200)
HDL: 75 mg/dL (ref >=50)
LDL Cholesterol (Calc): 114 mg/dL (calc) — ABNORMAL HIGH
Non-HDL Cholesterol (Calc): 128 mg/dL (calc) (ref <130)
Total CHOL/HDL Ratio: 2.7 (calc) (ref <5.0)
Triglycerides: 58 mg/dL (ref <150)

## 2018-09-16 ENCOUNTER — Ambulatory Visit (INDEPENDENT_AMBULATORY_CARE_PROVIDER_SITE_OTHER): Payer: 59 | Admitting: Nurse Practitioner

## 2018-09-16 ENCOUNTER — Encounter: Payer: Self-pay | Admitting: Nurse Practitioner

## 2018-09-16 ENCOUNTER — Other Ambulatory Visit: Payer: Self-pay

## 2018-09-16 DIAGNOSIS — H538 Other visual disturbances: Secondary | ICD-10-CM | POA: Diagnosis not present

## 2018-09-16 DIAGNOSIS — M8580 Other specified disorders of bone density and structure, unspecified site: Secondary | ICD-10-CM

## 2018-09-16 DIAGNOSIS — E782 Mixed hyperlipidemia: Secondary | ICD-10-CM | POA: Diagnosis not present

## 2018-09-16 DIAGNOSIS — A6009 Herpesviral infection of other urogenital tract: Secondary | ICD-10-CM

## 2018-09-16 DIAGNOSIS — I10 Essential (primary) hypertension: Secondary | ICD-10-CM | POA: Diagnosis not present

## 2018-09-16 DIAGNOSIS — R7301 Impaired fasting glucose: Secondary | ICD-10-CM

## 2018-09-16 DIAGNOSIS — F325 Major depressive disorder, single episode, in full remission: Secondary | ICD-10-CM | POA: Diagnosis not present

## 2018-09-16 NOTE — Progress Notes (Signed)
This service is provided via telemedicine  No vital signs collected/recorded due to the encounter was a telemedicine visit.   Location of patient (ex: home, work):  Home  Patient consents to a telephone visit: Yes  Location of the provider (ex: office, home):  [place], Office   Name of any referring provider:  N/A  Names of all persons participating in the telemedicine service and their role in the encounter:  S.Chrae B/CMA, Abbey Chatters, NP, and Patient   Time spent on call:  8 min with medical assistant      Careteam: Patient Care Team: [name], NP as PCP - General (Geriatric Medicine)  Advanced Directive information Does Patient Have a Medical Advance Directive?: No, Would patient like information on creating a medical advance directive?: Yes (MAU/Ambulatory/Procedural Areas - Information given)  Allergies  Allergen Reactions  . Mobic [Meloxicam]     GI bleed    Chief Complaint  Patient presents with  . Medical Management of Chronic Issues    6 month follow-up and discuss labs (patient can view on mychart). Patient c/o change in vision. Patient seen eye doctor and is scheduled to follow-up next week 09/25/2018 with Dr.Groat.      HPI: Patient is a 68 y.o. female for routine follow up.  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   Having trouble with her visit that started a few months ago (march) had floater in left eye and having blurred visit close up, she went to see Dr Dione Booze and scheduled follow up May 22nd.  Question if floaters were causing blurred vision.  Overall feels like she is doing well.   Hyperlipidemia - continues on crestor 5 mg by mouth three times weekly, not totally complaint but attempts to be. LDL down to 114 down from 932. No body aches or pains like before. Attempting to change diet and has increased exercise.   Obesity - last week weight stable at 196 lbs. Overall feeling better. Clothes fitting  better.   HTN - does not take blood pressure routinely, continues on HCTZ  MDD - controlled on sertraline 100 mg by mouth, depression is stable.  Herpes simplex- using valtrex 500 mg by mouth daily for suppression. No recent outbreaks.   Osteopenia- continues on cal and vit d, trying to remember to take consistently    Review of Systems:  Review of Systems  Constitutional: Negative for chills, fever and weight loss.  HENT: Negative for tinnitus.   Eyes: Positive for blurred vision.       Hx of glaucoma and corrective lens Currently with floaters and following with ophthalmology  Respiratory: Negative for cough, sputum production and shortness of breath.   Cardiovascular: Negative for chest pain, palpitations and leg swelling.  Gastrointestinal: Positive for heartburn (diet controlled.). Negative for abdominal pain, constipation and diarrhea.  Genitourinary: Negative for dysuria, frequency and urgency.  Musculoskeletal: Positive for joint pain. Negative for back pain, falls and myalgias.  Skin: Negative.   Neurological: Negative for dizziness and headaches.  Psychiatric/Behavioral: Positive for depression (in remission). Negative for memory loss. The patient does not have insomnia.     Past Medical History:  Diagnosis Date  . Arthritis   . Depression   . Diverticulosis    noted on colonscopy   . Elevated cholesterol   . H/O mammogram    2015, 2016  . 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 High blood pressure   . History of esophagogastroduodenoscopy (EGD)  2014, 2016  . History of PFTs 10/26/2012  . Lower GI bleed 01/19/2015   Related to mobic 15 mg   . Mitral valve prolapse 09/06/1996  . Osteoarthritis of both knees    Past Surgical History:  Procedure Laterality Date  . BREAST EXCISIONAL BIOPSY Left    benign  . BREAST SURGERY  05/20/2007   [facility], Dr.William Purkert   . COLONOSCOPY     2014, 2016 Dr.Bajani Manalo, Dr.Mahmood Abedi    Social History:   reports that  she has quit smoking. She has a 5.00 pack-year smoking history. She has never used smokeless tobacco. She reports current alcohol use. She reports that she does not use drugs.  Family History  Problem Relation Age of Onset  . Heart attack Mother   . CVA Mother   . Diabetes Mother   . Congestive Heart Failure Mother   . Stroke Mother   . Heart disease Mother   . Lung cancer Sister 60  . Cancer Brother        GI or colon cancer  . Neurologic Disorder Son   . Cancer Other        Niece-colon cancer  . Diabetes Other        Nephew    Medications: Patient's Medications  New Prescriptions   No medications on file  Previous Medications   CALCIUM CARBONATE-VITAMIN D (CALTRATE 600+D) 600-400 MG-UNIT TABLET    Take 1 tablet by mouth 2 (two) times daily.   HYDROCHLOROTHIAZIDE (HYDRODIURIL) 25 MG TABLET    Take 1 tablet (25 mg total) by mouth daily.   ROSUVASTATIN (CRESTOR) 5 MG TABLET    Take 1 tablet (5 mg total) by mouth 3 (three) times a week.   SERTRALINE (ZOLOFT) 100 MG TABLET    Take 1 tablet (100 mg total) by mouth daily.   VALACYCLOVIR (VALTREX) 500 MG TABLET    Take 1 tablet (500 mg total) by mouth daily.  Modified Medications   No medications on file  Discontinued Medications   No medications on file     Physical Exam: unable due to televisit.     Labs reviewed: Basic Metabolic Panel: Recent Labs    09/19/17 1027 03/16/18 1034 03/23/18 1207 09/14/18 0945  NA 143 142  --  143  K 3.8 3.9  --  3.6  CL 106 107  --  108  CO2 30 29  --  30  GLUCOSE 89 87  --  89  BUN 18 22  --  25  CREATININE 0.95 0.90  --  0.92  CALCIUM 9.4 9.5  --  9.5  TSH  --   --  1.47  --    Liver Function Tests: Recent Labs    09/19/17 1027 03/16/18 1034 09/14/18 0945  AST ALT BILITOT 0.4 0.4 0.3  PROT 7.2 6.8 6.4   No results for input(s): LIPASE, AMYLASE in the last 8760 hours. No results for input(s): AMMONIA in the last 8760 hours. CBC: Recent Labs     03/23/18 1207  WBC 7.4  NEUTROABS 3,722  HGB 12.9  HCT 39.1  MCV 93.1  PLT 277   Lipid Panel: Recent Labs    09/19/17 1027 03/16/18 1034 09/14/18 0945  CHOL 294* 216* 203*  HDL 72 71 75  LDLCALC 203* 130* 114*  TRIG 80 61 58  CHOLHDL 4.1 3.0 2.7   TSH: Recent Labs    03/23/18 1207  TSH 1.47  A1C: Lab Results  Component Value Date   HGBA1C 5.6 03/16/2018     Assessment/Plan 1. Essential hypertension Controlled on hctz, continue medication with lifestyle changes.   2. Depression, major, single episode, complete remission (HCC) Stable, in remission on zoloft 100 mg daily   3. Mixed hyperlipidemia LDL has improved with crestor 3 times weekly and diet modifications. Will continue current regimen with lifestyle modifications.   4. Herpes genitalis in women -stable on valtrex 500 mg by mouth daily for suppressive therapy, no recent exacerbations.   5. Osteopenia, unspecified location To continue to take caltrate with D 600/400 twice a day with weight bearing activity    6. Blurred vision, bilateral Being followed by ophthalmology, there has been no changes to vision since initial evaluation but no improvement. Plans to follow up on 09/25/2018  7. Impaired fasting glucose -continue lifestyle modifications - Hemoglobin A1c; Future   Next appt: 6 months with lab work prior to visit.  Janene HarveyJessica K. Biagio BorgEubanks, AGNP  [facility] [phone]    Virtual Visit via Telephone Note  I connected with pt on 09/16/18 at 10:00 AM EDT by telephone and verified that I am speaking with the correct person using two identifiers.  Location: Patient: home Provider: office    I discussed the limitations, risks, security and privacy concerns of performing an evaluation and management service by telephone and the availability of in person appointments. I also discussed with the patient that there may be a patient responsible charge related to this  service. The patient expressed understanding and agreed to proceed.   I discussed the assessment and treatment plan with the patient. The patient was provided an opportunity to ask questions and all were answered. The patient agreed with the plan and demonstrated an understanding of the instructions.   The patient was advised to call back or seek an in-person evaluation if the symptoms worsen or if the condition fails to improve as anticipated.  I provided 18 minutes of non-face-to-face time during this encounter.  Janene HarveyJessica K. Biagio BorgEubanks, AGNP Avs printed and mailed

## 2018-09-16 NOTE — Patient Instructions (Addendum)
Continue to work on diet modifications and increasing activity.   Great job on your lifestyle changes.

## 2018-09-21 ENCOUNTER — Ambulatory Visit: Payer: 59 | Admitting: Nurse Practitioner

## 2018-09-25 DIAGNOSIS — H2513 Age-related nuclear cataract, bilateral: Secondary | ICD-10-CM | POA: Diagnosis not present

## 2018-09-25 DIAGNOSIS — H0102B Squamous blepharitis left eye, upper and lower eyelids: Secondary | ICD-10-CM | POA: Diagnosis not present

## 2018-09-25 DIAGNOSIS — H35413 Lattice degeneration of retina, bilateral: Secondary | ICD-10-CM | POA: Diagnosis not present

## 2018-09-25 DIAGNOSIS — H1045 Other chronic allergic conjunctivitis: Secondary | ICD-10-CM | POA: Diagnosis not present

## 2018-09-25 DIAGNOSIS — H04123 Dry eye syndrome of bilateral lacrimal glands: Secondary | ICD-10-CM | POA: Diagnosis not present

## 2018-09-25 DIAGNOSIS — H43812 Vitreous degeneration, left eye: Secondary | ICD-10-CM | POA: Diagnosis not present

## 2018-09-25 DIAGNOSIS — H0102A Squamous blepharitis right eye, upper and lower eyelids: Secondary | ICD-10-CM | POA: Diagnosis not present

## 2018-10-30 MED FILL — SHINGRIX 50 MCG SUS: 50 | 1 days supply | Qty: 1 | Fill #1

## 2018-11-19 ENCOUNTER — Encounter: Payer: Self-pay | Admitting: Nurse Practitioner

## 2018-11-19 NOTE — Telephone Encounter (Signed)
We will await patient's call to schedule or reply via mychart

## 2018-11-19 NOTE — Telephone Encounter (Signed)
Message routed to Dinah Ngetich, NP. 

## 2018-11-19 NOTE — Telephone Encounter (Signed)
Will need to schedule an appointment at the office to evaluate knee pain and eczema then will determine which specialist to refer to.

## 2018-12-22 ENCOUNTER — Other Ambulatory Visit: Payer: Self-pay | Admitting: Nurse Practitioner

## 2018-12-22 DIAGNOSIS — E782 Mixed hyperlipidemia: Secondary | ICD-10-CM

## 2018-12-22 MED FILL — HYDROCHLOROTHIAZIDE 25 MG T: 25 | 90 days supply | Qty: 90 | Fill #1

## 2018-12-22 MED FILL — ROSUVASTATIN CALCIUM 5 MG T: 5 | 35 days supply | Qty: 15 | Fill #0

## 2018-12-22 MED FILL — VALACYCLOVIR HCL 500 MG TAB: 500 | 90 days supply | Qty: 90 | Fill #1

## 2018-12-22 MED FILL — SERTRALINE HCL 100 MG TAB: 100 | 90 days supply | Qty: 90 | Fill #1

## 2019-01-29 ENCOUNTER — Other Ambulatory Visit: Payer: Self-pay | Admitting: Nurse Practitioner

## 2019-01-29 DIAGNOSIS — E782 Mixed hyperlipidemia: Secondary | ICD-10-CM

## 2019-02-01 MED FILL — ROSUVASTATIN CALCIUM 5 MG T: 5 | 35 days supply | Qty: 15 | Fill #0

## 2019-03-09 ENCOUNTER — Other Ambulatory Visit: Payer: Self-pay | Admitting: Nurse Practitioner

## 2019-03-09 DIAGNOSIS — E782 Mixed hyperlipidemia: Secondary | ICD-10-CM

## 2019-03-09 MED FILL — ROSUVASTATIN CALCIUM 5 MG T: 5 | 84 days supply | Qty: 36 | Fill #0

## 2019-03-09 NOTE — Telephone Encounter (Signed)
[facility] 

## 2019-03-15 ENCOUNTER — Other Ambulatory Visit: Payer: 59

## 2019-03-15 ENCOUNTER — Other Ambulatory Visit: Payer: Self-pay

## 2019-03-15 DIAGNOSIS — R7301 Impaired fasting glucose: Secondary | ICD-10-CM | POA: Diagnosis not present

## 2019-03-15 DIAGNOSIS — E782 Mixed hyperlipidemia: Secondary | ICD-10-CM

## 2019-03-15 DIAGNOSIS — I10 Essential (primary) hypertension: Secondary | ICD-10-CM

## 2019-03-16 ENCOUNTER — Other Ambulatory Visit: Payer: Self-pay | Admitting: Nurse Practitioner

## 2019-03-16 DIAGNOSIS — Z1231 Encounter for screening mammogram for malignant neoplasm of breast: Secondary | ICD-10-CM

## 2019-03-16 LAB — CBC WITH DIFFERENTIAL/PLATELET
Absolute Monocytes: 570 cells/uL (ref 200–950)
Basophils Absolute: 39 cells/uL (ref 0–200)
Basophils Relative: 0.5 %
Eosinophils Absolute: 108 cells/uL (ref 15–500)
Eosinophils Relative: 1.4 %
HCT: 37.3 % (ref 35.0–45.0)
Hemoglobin: 12.1 g/dL (ref 11.7–15.5)
Lymphs Abs: 3042 cells/uL (ref 850–3900)
MCH: 31.1 pg (ref 27.0–33.0)
MCHC: 32.4 g/dL (ref 32.0–36.0)
MCV: 95.9 fL (ref 80.0–100.0)
MPV: 10.4 fL (ref 7.5–12.5)
Monocytes Relative: 7.4 %
Neutro Abs: 3942 cells/uL (ref 1500–7800)
Neutrophils Relative %: 51.2 %
Platelets: 276 10*3/uL (ref 140–400)
RBC: 3.89 10*6/uL (ref 3.80–5.10)
RDW: 13.1 % (ref 11.0–15.0)
Total Lymphocyte: 39.5 %
WBC: 7.7 10*3/uL (ref 3.8–10.8)

## 2019-03-16 LAB — LIPID PANEL
Cholesterol: 197 mg/dL (ref <200)
HDL: 69 mg/dL (ref >=50)
LDL Cholesterol (Calc): 113 mg/dL (calc) — ABNORMAL HIGH
Non-HDL Cholesterol (Calc): 128 mg/dL (calc) (ref <130)
Total CHOL/HDL Ratio: 2.9 (calc) (ref <5.0)
Triglycerides: 68 mg/dL (ref <150)

## 2019-03-16 LAB — HEMOGLOBIN A1C
Hgb A1c MFr Bld: 5.6 % of total Hgb (ref <5.7)
Mean Plasma Glucose: 114 (calc)
eAG (mmol/L): 6.3 (calc)

## 2019-03-16 LAB — COMPLETE METABOLIC PANEL WITH GFR
AG Ratio: 1.4 (calc) (ref 1.0–2.5)
ALT: 11 U/L (ref 6–29)
AST: 18 U/L (ref 10–35)
Albumin: 4 g/dL (ref 3.6–5.1)
Alkaline phosphatase (APISO): 70 U/L (ref 37–153)
BUN: 21 mg/dL (ref 7–25)
CO2: 28 mmol/L (ref 20–32)
Calcium: 9.4 mg/dL (ref 8.6–10.4)
Chloride: 104 mmol/L (ref 98–110)
Creat: 0.86 mg/dL (ref 0.50–0.99)
GFR, Est African American: 81 mL/min/{1.73_m2} (ref >=60)
GFR, Est Non African American: 70 mL/min/{1.73_m2} (ref >=60)
Globulin: 2.8 g/dL (calc) (ref 1.9–3.7)
Glucose, Bld: 83 mg/dL (ref 65–99)
Potassium: 3.8 mmol/L (ref 3.5–5.3)
Sodium: 141 mmol/L (ref 135–146)
Total Bilirubin: 0.3 mg/dL (ref 0.2–1.2)
Total Protein: 6.8 g/dL (ref 6.1–8.1)

## 2019-03-18 ENCOUNTER — Ambulatory Visit: Payer: 59 | Admitting: Nurse Practitioner

## 2019-03-18 ENCOUNTER — Ambulatory Visit: Payer: 59 | Admitting: Family

## 2019-03-29 ENCOUNTER — Other Ambulatory Visit: Payer: Self-pay

## 2019-03-29 ENCOUNTER — Ambulatory Visit (INDEPENDENT_AMBULATORY_CARE_PROVIDER_SITE_OTHER): Payer: 59 | Admitting: Nurse Practitioner

## 2019-03-29 ENCOUNTER — Encounter: Payer: Self-pay | Admitting: Nurse Practitioner

## 2019-03-29 VITALS — BP 112/70 | HR 76 | Temp 96.9°F | Ht 63.0 in | Wt 202.0 lb

## 2019-03-29 DIAGNOSIS — M1711 Unilateral primary osteoarthritis, right knee: Secondary | ICD-10-CM

## 2019-03-29 DIAGNOSIS — Z6835 Body mass index (BMI) 35.0-35.9, adult: Secondary | ICD-10-CM

## 2019-03-29 DIAGNOSIS — E782 Mixed hyperlipidemia: Secondary | ICD-10-CM

## 2019-03-29 DIAGNOSIS — R519 Headache, unspecified: Secondary | ICD-10-CM | POA: Diagnosis not present

## 2019-03-29 DIAGNOSIS — I10 Essential (primary) hypertension: Secondary | ICD-10-CM | POA: Diagnosis not present

## 2019-03-29 DIAGNOSIS — F325 Major depressive disorder, single episode, in full remission: Secondary | ICD-10-CM

## 2019-03-29 DIAGNOSIS — M8580 Other specified disorders of bone density and structure, unspecified site: Secondary | ICD-10-CM | POA: Diagnosis not present

## 2019-03-29 MED ORDER — ROSUVASTATIN CALCIUM 5 MG PO TABS: 5.0000 mg | ORAL_TABLET | Freq: Every day | ORAL | 3 refills | Status: DC

## 2019-03-29 NOTE — Patient Instructions (Addendum)
To start cerave cream 1-2 daily - to use immediatly after bathing No scent to laundry detergent (free and clear) or body wash Chose cream over lotion  Orthopedic referral placed for knee Continue to use ice   Increase hydration Neurology referral for headache Make log of headache

## 2019-03-29 NOTE — Progress Notes (Signed)
Careteam: Patient Care Team: Sharon SellerEubanks, Mathayus Stanbery K, NP as PCP - General (Geriatric Medicine)  Advanced Directive information    Allergies  Allergen Reactions  . Mobic [Meloxicam]     GI bleed    Chief Complaint  Patient presents with  . Medical Management of Chronic Issues    6 month follow-up. Headache x 1 month off/on. Patient currently with headache.   . Knee Problem    Patient c/o right knee swelling and decreased mobility. Patient requesting referral   . Rash    Rash on back for several months     HPI: Patient is a 68 y.o. female seen in the office today for routine follow up.   Headaches- never had headaches before- blood pressure has been well controlled. Reports it is possible stress but does not feel really stressed. Has been going on for about a month. 2-3 times a week.  6-7/10. Described as an ache in the front of her head. Today pain is above right eye and goes around to the right side. Does not take any medication for this. Will last a few hours and then goes away on its own.  No dizziness, no changes in vision or blurred vision. No decrease in hearing. No sinus congestion or fullness. No loss of balance or trouble walking. Still able to normally function with a headache and not requiring medication.   Knee pain, right- has chronic right knee pain due to arthritis. Moved in  2017, had an injection in 2016. Normally last for about 5 years. Had swelling. Used ice which improved symptoms but was difficult to walk.  Better now but still feels some swelling and feels the tightness. Pain was 10/10 when swollen but now not having this. No pain currently, just tight.   Depression- in remission. Not bothered by depression. Continues to zoloft 100 mg daily  HTN- HCTZ which controls. Following low sodium diet.   Osteopenia-   Hyperlipidemia- Crestor 5 mg three days a week- not doing that good with it. She reports she was eating a lot of cheese and dairy in attempts to  lower LDL. Has increase activity. Thought that she could eat whatever she wanted with her exercise but weight went up.   Obesity- working on diet and exercise. Realized that she had to cut out food because she was gaining weight.   Rash- itchy scaly rash on the back- feels like it is eczema and states it is related to stress- has improved but still some rough patches- has not used anything other than Aveeno lotion.   Review of Systems:  Review of Systems  Constitutional: Negative for chills, fever and weight loss.  HENT: Negative for tinnitus.   Respiratory: Negative for cough, sputum production and shortness of breath.   Cardiovascular: Negative for chest pain, palpitations and leg swelling.  Gastrointestinal: Negative for abdominal pain, constipation, diarrhea and heartburn.  Genitourinary: Negative for dysuria, frequency and urgency.  Musculoskeletal: Negative for back pain, falls, joint pain and myalgias.  Skin: Positive for itching and rash.  Neurological: Positive for headaches. Negative for dizziness, tingling, tremors and sensory change.  Psychiatric/Behavioral: Negative for depression and memory loss. The patient does not have insomnia.     Past Medical History:  Diagnosis Date  . Arthritis   . Depression   . Diverticulosis    noted on colonscopy   . Elevated cholesterol   . H/O mammogram    2015, 2016  . 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  High blood pressure   . History of esophagogastroduodenoscopy (EGD)    2014, 2016  . History of PFTs 10/26/2012  . Lower GI bleed 01/19/2015   Related to mobic 15 mg   . Mitral valve prolapse 09/06/1996  . Osteoarthritis of both knees    Past Surgical History:  Procedure Laterality Date  . BREAST EXCISIONAL BIOPSY Left    benign  . BREAST SURGERY  05/20/2007   [facility], Dr.William Purkert   . COLONOSCOPY     2014, 2016 Dr.Bajani Manalo, Dr.Mahmood Abedi    Social History:   reports that she has quit smoking. She has a 5.00 pack-year  smoking history. She has never used smokeless tobacco. She reports current alcohol use. She reports that she does not use drugs.  Family History  Problem Relation Age of Onset  . Heart attack Mother   . CVA Mother   . Diabetes Mother   . Congestive Heart Failure Mother   . Stroke Mother   . Heart disease Mother   . Lung cancer Sister 40  . Cancer Brother        GI or colon cancer  . Neurologic Disorder Son   . Cancer Other        Niece-colon cancer  . Diabetes Other        Nephew    Medications: Patient's Medications  New Prescriptions   No medications on file  Previous Medications   CALCIUM CARBONATE-VITAMIN D (CALTRATE 600+D) 600-400 MG-UNIT TABLET    Take 1 tablet by mouth 2 (two) times daily.   HYDROCHLOROTHIAZIDE (HYDRODIURIL) 25 MG TABLET    Take 1 tablet (25 mg total) by mouth daily.   ROSUVASTATIN (CRESTOR) 5 MG TABLET    TAKE 1 TABLET BY MOUTH 3 TIMES A WEEK.   SERTRALINE (ZOLOFT) 100 MG TABLET    Take 1 tablet (100 mg total) by mouth daily.   VALACYCLOVIR (VALTREX) 500 MG TABLET    Take 1 tablet (500 mg total) by mouth daily.  Modified Medications   No medications on file  Discontinued Medications   No medications on file    Physical Exam:  Vitals:   03/29/19 0950  BP: 112/70  Pulse: 76  Temp: (!) 96.9 F (36.1 C)  TempSrc: Temporal  SpO2: 98%  Weight: 202 lb (91.6 kg)  Height: 5\' 3"  (1.6 m)   Body mass index is 35.78 kg/m. Wt Readings from Last 3 Encounters:  03/29/19 202 lb (91.6 kg)  03/23/18 196 lb (88.9 kg)  09/19/17 194 lb 12.8 oz (88.4 kg)    Physical Exam Constitutional:      General: She is not in acute distress.    Appearance: She is well-developed. She is not diaphoretic.  HENT:     Head: Normocephalic and atraumatic.     Mouth/Throat:     Pharynx: No oropharyngeal exudate.  Eyes:     Conjunctiva/sclera: Conjunctivae normal.     Pupils: Pupils are equal, round, and reactive to light.  Neck:     Musculoskeletal: Normal range of  motion and neck supple.  Cardiovascular:     Rate and Rhythm: Normal rate and regular rhythm.     Heart sounds: Normal heart sounds.  Pulmonary:     Effort: Pulmonary effort is normal.     Breath sounds: Normal breath sounds.  Abdominal:     General: Bowel sounds are normal.     Palpations: Abdomen is soft.  Musculoskeletal:        General: No tenderness.  Skin:    General: Skin is warm and dry.  Neurological:     General: No focal deficit present.     Mental Status: She is alert and oriented to person, place, and time.     Cranial Nerves: No cranial nerve deficit.     Sensory: No sensory deficit.     Motor: No weakness.     Coordination: Coordination normal.     Gait: Gait normal.     Deep Tendon Reflexes: Reflexes normal.  Psychiatric:        Mood and Affect: Mood normal.        Behavior: Behavior normal.     Labs reviewed: Basic Metabolic Panel: Recent Labs    09/14/18 0945 03/15/19 0811  NA 143 141  K 3.6 3.8  CL 108 104  CO2 30 28  GLUCOSE 89 83  BUN 25 21  CREATININE 0.92 0.86  CALCIUM 9.5 9.4   Liver Function Tests: Recent Labs    09/14/18 0945 03/15/19 0811  AST 15 18  ALT 10 11  BILITOT 0.3 0.3  PROT 6.4 6.8   No results for input(s): LIPASE, AMYLASE in the last 8760 hours. No results for input(s): AMMONIA in the last 8760 hours. CBC: Recent Labs    03/15/19 0811  WBC 7.7  NEUTROABS 3,942  HGB 12.1  HCT 37.3  MCV 95.9  PLT 276   Lipid Panel: Recent Labs    09/14/18 0945 03/15/19 0811  CHOL 203* 197  HDL 75 69  LDLCALC 114* 113*  TRIG 58 68  CHOLHDL 2.7 2.9   TSH: No results for input(s): TSH in the last 8760 hours. A1C: Lab Results  Component Value Date   HGBA1C 5.6 03/15/2019     Assessment/Plan 1. Essential hypertension - well controlled on HCTZ and dietary modifications  - COMPLETE METABOLIC PANEL WITH GFR; Future - CBC with Differential/Platelet; Future  2. Osteopenia, unspecified location -continues on cal and  vit d with weight bearing activity  3. Depression, major, single episode, complete remission ([place]) -stable on zoloft.  4. Mixed hyperlipidemia -reports she had poor dietary compliance but has changed at this time. Recommend continue with medication and dietary recommendation. Also continues to exercise.  - rosuvastatin (CRESTOR) 5 MG tablet; Take 1 tablet (5 mg total) by mouth at bedtime.  Dispense: 45 tablet; Refill: 3 - Lipid Panel; Future - COMPLETE METABOLIC PANEL WITH GFR; Future  5. Primary osteoarthritis of right knee -continues to use ice which overall has improved knee. Would like to establish care with orthopedic incase she has another flare. - AMB referral to orthopedics  6. Nonintractable headache, unspecified chronicity pattern, unspecified headache type -plans to increase hydration.  -will keep a log on duration,quality, time, etc of headaches - Ambulatory referral to Neurology for further evaluation.   7. Body mass index (BMI) of 35.0-35.9 in adult Noted today  8. Class 2 severe obesity due to excess calories with serious comorbidity and body mass index (BMI) of 35.0 to 35.9 in adult [facility]) Noted, she is aware of need for dietary changes as well as increase in physical activity for weight loss.    Next appt: 6 months, lab work before visit.  Carlos American. Johnson, [facility] [phone]

## 2019-04-08 ENCOUNTER — Other Ambulatory Visit: Payer: Self-pay

## 2019-04-08 ENCOUNTER — Encounter: Payer: Self-pay | Admitting: Orthopaedic Surgery

## 2019-04-08 ENCOUNTER — Ambulatory Visit (INDEPENDENT_AMBULATORY_CARE_PROVIDER_SITE_OTHER): Payer: 59

## 2019-04-08 ENCOUNTER — Ambulatory Visit: Payer: 59 | Admitting: Orthopaedic Surgery

## 2019-04-08 DIAGNOSIS — M25561 Pain in right knee: Secondary | ICD-10-CM | POA: Diagnosis not present

## 2019-04-08 DIAGNOSIS — R634 Abnormal weight loss: Secondary | ICD-10-CM | POA: Diagnosis not present

## 2019-04-08 MED ORDER — PENNSAID 2 % EX SOLN: 2.0000 g | Freq: Two times a day (BID) | CUTANEOUS | 3 refills | Status: DC | PRN

## 2019-04-08 NOTE — Progress Notes (Signed)
Office Visit Note   Patient: Alejandra Crane           Date of Birth: [birth year]           MRN: [id number] Visit Date: 04/08/2019              Requested by: [name], NP [address] [place],  [place] [address] PCP: [name], NP   Assessment & Plan: Visit Diagnoses:  1. Right knee pain, unspecified chronicity   2. Weight loss     Plan: Impression is moderate right knee tricompartmental DJD with small effusion.  Based on discussion today patient would like to move forward with physical therapy, referral to Dr. Dalbert Garnet for weight loss, pennsaid.  She like to hold off on injections for now.  I did give her a pamphlet on Synvisc.  Questions encouraged and answered.  Follow-up as needed.  Follow-Up Instructions: Return if symptoms worsen or fail to improve.   Orders:  Orders Placed This Encounter  Procedures  . XR KNEE 3 VIEW RIGHT  . Ambulatory referral to Physical Therapy  . Amb Ref to Medical Weight Management   Meds ordered this encounter  Medications  . Diclofenac Sodium (PENNSAID) 2 % SOLN    Sig: Apply 2 g topically 2 (two) times daily as needed (to affected area).    Dispense:  112 g    Refill:  3      Procedures: No procedures performed   Clinical Data: No additional findings.   Subjective: Chief Complaint  Patient presents with  . 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  It started back in 2016 and she has had persistent mild swelling in the knee since then.  She has had a previous cortisone injection about 5 years ago which did help significantly.  Denies any injuries.  She did take Mobic which made her feel really good but she ended up with a GI bleed so therefore she can no longer take NSAIDs.  She has gained some weight recently as well which has made the pain worse.  Denies any mechanical symptoms.    Review of Systems  Constitutional: Negative.   HENT: Negative.   Eyes: Negative.   Respiratory: Negative.   Cardiovascular: Negative.   Endocrine: Negative.   Musculoskeletal: Negative.   Neurological: Negative.   Hematological: Negative.   Psychiatric/Behavioral: Negative.   All other systems reviewed and are negative.    Objective: Vital Signs: There were no vitals taken for this visit.  Physical Exam Vitals signs and nursing note reviewed.  Constitutional:      Appearance: She is well-developed.  HENT:     Head: Normocephalic and atraumatic.  Neck:     Musculoskeletal: Neck supple.  Pulmonary:     Effort: Pulmonary effort is normal.  Abdominal:     Palpations: Abdomen is soft.  Skin:    General: Skin is warm.     Capillary Refill: Capillary refill takes less than 2 seconds.  Neurological:     Mental Status: She is alert and oriented to person, place, and time.  Psychiatric:        Behavior: Behavior normal.        Thought Content: Thought content normal.        Judgment: Judgment normal.     Ortho Exam Right knee  exam shows a small joint effusion.  Collaterals and cruciates are stable.  1+ patellofemoral crepitus.  Normal range of motion. Specialty Comments:  No specialty comments available.  Imaging: Xr Knee 3 View Right  Result Date: 04/08/2019 Moderate tricompartmental DJD.  No acute abnormalities.    PMFS History: Patient Active Problem List   Diagnosis Date Noted  . Osteopenia 09/16/2018  . Essential hypertension 02/19/2017  . Mixed hyperlipidemia 02/19/2017  . Depression, major, single episode, complete remission ([place]) 02/19/2017  . Herpes genitalis in women 02/19/2017   Past Medical History:  Diagnosis Date  . Arthritis   . Depression   . Diverticulosis    noted on colonscopy   . Elevated cholesterol   . H/O mammogram    2015, 2016  . 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 High blood pressure   . History of esophagogastroduodenoscopy (EGD)     2014, 2016  . History of PFTs 10/26/2012  . Lower GI bleed 01/19/2015   Related to mobic 15 mg   . Mitral valve prolapse 09/06/1996  . Osteoarthritis of both knees     Family History  Problem Relation Age of Onset  . Heart attack Mother   . CVA Mother   . Diabetes Mother   . Congestive Heart Failure Mother   . Stroke Mother   . Heart disease Mother   . Lung cancer Sister 87  . Cancer Brother        GI or colon cancer  . Neurologic Disorder Son   . Cancer Other        Niece-colon cancer  . Diabetes Other        Nephew    Past Surgical History:  Procedure Laterality Date  . BREAST EXCISIONAL BIOPSY Left    benign  . BREAST SURGERY  05/20/2007   [facility], Dr.William Purkert   . COLONOSCOPY     2014, 2016 Dr.Bajani Manalo, [place]    Social History   Occupational History  . Not on file  Tobacco Use  . Smoking status: Former Smoker    Packs/day: 0.25    Years: 20.00    Pack years: 5.00  . Smokeless tobacco: Never Used  . Tobacco comment: Quit in her 90's   Substance and Sexual Activity  . Alcohol use: Yes    Comment: Occasionally red wine   . Drug use: No  . Sexual activity: Not Currently

## 2019-04-14 ENCOUNTER — Other Ambulatory Visit: Payer: Self-pay

## 2019-04-14 ENCOUNTER — Encounter: Payer: Self-pay | Admitting: Physical Therapy

## 2019-04-14 ENCOUNTER — Ambulatory Visit (INDEPENDENT_AMBULATORY_CARE_PROVIDER_SITE_OTHER): Payer: 59 | Admitting: Physical Therapy

## 2019-04-14 DIAGNOSIS — R600 Localized edema: Secondary | ICD-10-CM

## 2019-04-14 DIAGNOSIS — M25661 Stiffness of right knee, not elsewhere classified: Secondary | ICD-10-CM

## 2019-04-14 DIAGNOSIS — M25561 Pain in right knee: Secondary | ICD-10-CM

## 2019-04-14 DIAGNOSIS — G8929 Other chronic pain: Secondary | ICD-10-CM

## 2019-04-14 DIAGNOSIS — M6281 Muscle weakness (generalized): Secondary | ICD-10-CM

## 2019-04-14 NOTE — Therapy (Signed)
[facility] [place], [place], [address] Phone: [phone]   Fax:  [phone]  Physical Therapy Evaluation  Patient Details  Name: Alejandra Crane MRN: [id number] Date of Birth: 1950/12/31 Referring Provider (PT): Leandrew Koyanagi, MD   Encounter Date: 04/14/2019  PT End of Session - 04/14/19 1413    Visit Number  1    Number of Visits  12    Date for PT Re-Evaluation  05/26/19    PT Start Time  0160    PT Stop Time  1227    PT Time Calculation (min)  38 min    Activity Tolerance  Patient tolerated treatment well    Behavior During Therapy  [facility] for tasks assessed/performed       Past Medical History:  Diagnosis Date  . Arthritis   . Depression   . Diverticulosis    noted on colonscopy   . Elevated cholesterol   . H/O mammogram    2015, 2016  . 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 High blood pressure   . History of esophagogastroduodenoscopy (EGD)    2014, 2016  . History of PFTs 10/26/2012  . Lower GI bleed 01/19/2015   Related to mobic 15 mg   . Mitral valve prolapse 09/06/1996  . Osteoarthritis of both knees     Past Surgical History:  Procedure Laterality Date  . BREAST EXCISIONAL BIOPSY Left    benign  . BREAST SURGERY  05/20/2007   [facility], Dr.William Purkert   . COLONOSCOPY     2014, 2016 Dr.Bajani Manalo, Dr.Mahmood Abedi     There were no vitals filed for this visit.   Subjective Assessment - 04/14/19 1151    Subjective  Pt is a 68 y/o female who presents to OPPT for Rt knee pain.  Pt reports chronic knee pain and swelling, with exacerbation x 2-3 months.  Pt with hx of OA, and hx of steroids, injections and PT in the past.  MD recommended pennsaid, PT, and weight management program.    Limitations  Standing;Walking    How long can you walk comfortably?  2 miles    Patient Stated Goals  improve pain, maintain mobility, "last time I thought is was a bunch of crap, but it really made a difference"    Currently  in Pain?  Yes    Pain Score  0-No pain   up to 5/10   Pain Location  Knee    Pain Orientation  Right    Pain Descriptors / Indicators  Aching;Tightness;Other (Comment)   stiff due to swelling   Pain Type  Chronic pain    Pain Onset  More than a month ago    Pain Frequency  Intermittent    Aggravating Factors   standing, walking, stairs (doesn't have at this time)    Pain Relieving Factors  ice         [facility] PT Assessment - 04/14/19 1157      Assessment   Medical Diagnosis  M25.561 (ICD-10-CM) - Right knee pain, unspecified chronicity    Referring Provider (PT)  Leandrew Koyanagi, MD    Onset Date/Surgical Date  --   chronic with exacerbation x 2-3 months   Hand Dominance  Left    Next MD Visit  PRN    Prior Therapy  back in [place] for initial pain      Precautions   Precautions  None      Restrictions   Weight Bearing  Restrictions  No      Balance Screen   Has the patient fallen in the past 6 months  No    Has the patient had a decrease in activity level because of a fear of falling?   No    Is the patient reluctant to leave their home because of a fear of falling?   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    Leisure  sewing, walking daily (aims for 2 miles/day)      Cognition   Overall Cognitive Status  Within Functional Limits for tasks assessed      Observation/Other Assessments   Observations  Rt knee swelling noted - not formally measured as pt wearing pants      Posture/Postural Control   Posture/Postural Control  Postural limitations    Postural Limitations  Rounded Shoulders;Forward head      ROM / Strength   AROM / PROM / Strength  AROM;Strength      AROM   AROM Assessment Site  Knee     Right/Left Knee  Right;Left    Right Knee Extension  0    Right Knee Flexion  118    Left Knee Extension  0    Left Knee Flexion  130      Strength   Strength Assessment Site  Hip;Knee    Right/Left Hip  Right;Left    Right Hip Flexion  4/5    Right Hip Extension  3-/5    Right Hip ABduction  3/5    Left Hip Flexion  5/5    Left Hip Extension  3/5    Left Hip ABduction  4/5    Right/Left Knee  Right;Left    Right Knee Flexion  4/5    Right Knee Extension  4/5   poor VMO activation, quad lag present with SLR   Left Knee Flexion  5/5    Left Knee Extension  5/5      Palpation   Palpation comment  no significant tenderness noted Rt knee                Objective measurements completed on examination: See above findings.      [facility] Adult PT Treatment/Exercise - 04/14/19 1157      Exercises   Exercises  Knee/Hip      Knee/Hip Exercises: Seated   Long Arc Quad  Right;5 reps    Long Arc Quad Limitations  5 sec hold      Knee/Hip Exercises: Supine   Bridges  Both;5 reps    Straight Leg Raises  5 reps;Right      Knee/Hip Exercises: Sidelying   Hip ABduction  Right;5 reps    Hip ABduction Limitations  with extension             PT Education - 04/14/19 1413    Education Details  HEP, TENS unit and benefits of aquatic exercise    Person(s) Educated  Patient    Methods  Explanation;Demonstration;Handout    Comprehension  Verbalized understanding;Returned demonstration;Need further instruction  PT Long Term Goals - 04/14/19 1418      PT LONG TERM GOAL #1   Title  independent with HEP    Status  New    Target Date  05/26/19      PT LONG TERM GOAL #2   Title  report pain < 4/10 with activity for improved function    Status  New    Target Date  05/26/19      PT LONG TERM GOAL #3   Title  demonstrate at least 4+/5 Rt hip/knee strength for improved function    Status  New    Target Date  05/26/19      PT LONG TERM GOAL #4   Title   report ability to continue to walk 2+ miles without increase in pain for improved function    Status  New    Target Date  05/26/19             Plan - 04/14/19 1414    Clinical Impression Statement  Pt is a 68 y/o female who presents to OPPT for acute exacerbation of chronic Rt knee pain.  Pt demonstrates decreased strength and mild ROM limitations, as well as increased edema affecting functional mobility.  Pt will benefit from PT to address deficits listed.    Personal Factors and Comorbidities  Comorbidity 3+;Past/Current Experience    Comorbidities  OA, depression, anxiety, herpes, HTN, mitral valve prolapse    Examination-Activity Limitations  Stand;Locomotion Level;Transfers;Squat;Stairs    Examination-Participation Restrictions  Other   exercise, occupation   Stability/Clinical Decision Making  Evolving/Moderate complexity    Clinical Decision Making  Moderate    Rehab Potential  Good    PT Frequency  2x / week    PT Duration  6 weeks    PT Treatment/Interventions  ADLs/Self Care Home Management;Cryotherapy;Electrical Stimulation;Ultrasound;Moist Heat;Iontophoresis 4mg /ml Dexamethasone;Gait training;Stair training;Functional mobility training;Therapeutic activities;Therapeutic exercise;Balance training;Patient/family education;Neuromuscular re-education;Manual techniques;Vasopneumatic Device;Taping;Dry needling;Passive range of motion    PT Next Visit Plan  review HEP, progress strengthening (open chain then progressing to closed chain as pain improves), modalities PRN for pain    PT Home Exercise Plan  Access Code: [email or web address]    Consulted and Agree with Plan of Care  Patient       Patient will benefit from skilled therapeutic intervention in order to improve the following deficits and impairments:  Abnormal gait, Pain, Postural dysfunction, Impaired flexibility, Decreased range of motion, Decreased strength, Increased edema, Difficulty walking, Decreased mobility  Visit  Diagnosis: Chronic pain of right knee - Plan: PT plan of care cert/re-cert  Stiffness of right knee, not elsewhere classified - Plan: PT plan of care cert/re-cert  Localized edema - Plan: PT plan of care cert/re-cert  Muscle weakness (generalized) - Plan: PT plan of care cert/re-cert     Problem List Patient Active Problem List   Diagnosis Date Noted  . Osteopenia 09/16/2018  . Essential hypertension 02/19/2017  . Mixed hyperlipidemia 02/19/2017  . Depression, major, single episode, complete remission ([facility]) 02/19/2017  . Herpes genitalis in women 02/19/2017       Clarita CraneStephanie F Bary Limbach, PT, DPT 04/14/19 2:26 PM     [facility] [address] [place], [place], [address] Phone: [phone]   Fax:  [phone]  Name: Alejandra Crane MRN: [id number] Date of Birth: Apr 20, 1951

## 2019-04-14 NOTE — Patient Instructions (Signed)
Access Code: [email or web address]  URL: [email or web address]  Date: 04/14/2019  Prepared by: Faustino Congress   Exercises Supine Bridge - 10 reps - 1 sets - 5 sec hold - 2x daily - 7x weekly Supine Straight Leg Raises - 10 reps - 1 sets - 2x daily - 7x weekly                  Sidelying Hip Extension in Abduction - 10 reps - 1 sets - 2x daily - 7x weekly Seated Long Arc Quad - 10 reps - 1 sets - 5 sec hold - 2x daily - 7x weekly Patient Education TENS Unit

## 2019-04-20 ENCOUNTER — Ambulatory Visit (INDEPENDENT_AMBULATORY_CARE_PROVIDER_SITE_OTHER): Payer: 59 | Admitting: Orthotics

## 2019-04-20 ENCOUNTER — Encounter: Payer: Self-pay | Admitting: Sports Medicine

## 2019-04-20 ENCOUNTER — Ambulatory Visit (INDEPENDENT_AMBULATORY_CARE_PROVIDER_SITE_OTHER): Payer: 59 | Admitting: Sports Medicine

## 2019-04-20 ENCOUNTER — Other Ambulatory Visit: Payer: Self-pay

## 2019-04-20 DIAGNOSIS — M7751 Other enthesopathy of right foot: Secondary | ICD-10-CM | POA: Diagnosis not present

## 2019-04-20 DIAGNOSIS — M779 Enthesopathy, unspecified: Secondary | ICD-10-CM | POA: Diagnosis not present

## 2019-04-20 DIAGNOSIS — M2141 Flat foot [pes planus] (acquired), right foot: Secondary | ICD-10-CM

## 2019-04-20 DIAGNOSIS — M2142 Flat foot [pes planus] (acquired), left foot: Secondary | ICD-10-CM | POA: Diagnosis not present

## 2019-04-20 DIAGNOSIS — M7752 Other enthesopathy of left foot: Secondary | ICD-10-CM | POA: Diagnosis not present

## 2019-04-20 DIAGNOSIS — M79672 Pain in left foot: Secondary | ICD-10-CM

## 2019-04-20 DIAGNOSIS — M79671 Pain in right foot: Secondary | ICD-10-CM | POA: Diagnosis not present

## 2019-04-20 DIAGNOSIS — M729 Fibroblastic disorder, unspecified: Secondary | ICD-10-CM | POA: Diagnosis not present

## 2019-04-20 MED FILL — OMRON 3 SERIES BP MONITOR D: 30 days supply | Qty: 1 | Fill #0

## 2019-04-20 NOTE — Progress Notes (Signed)
Subjective: Alejandra Crane is a 68 y.o. female returns who presents to office for evaluation of bilateral foot pain. Patient reports that she is interested in orthotics for her fallen arches and some pain 5/10 but when she walks around pain 7/10.Reports that her right doesn't usually bother her. Patient denies any other pedal complaints.   Patient Active Problem List   Diagnosis Date Noted  . Osteopenia 09/16/2018  . Essential hypertension 02/19/2017  . Mixed hyperlipidemia 02/19/2017  . Depression, major, single episode, complete remission ([place]) 02/19/2017  . Herpes genitalis in women 02/19/2017    Current Outpatient Medications on File Prior to Visit  Medication Sig Dispense Refill  . Calcium Carbonate-Vitamin D (CALTRATE 600+D) 600-400 MG-UNIT tablet Take 1 tablet by mouth 2 (two) times daily. 180 tablet 3  . Diclofenac Sodium (PENNSAID) 2 % SOLN Apply 2 g topically 2 (two) times daily as needed (to affected area). 112 g 3  . hydrochlorothiazide (HYDRODIURIL) 25 MG tablet Take 1 tablet (25 mg total) by mouth daily. 90 tablet 3  . rosuvastatin (CRESTOR) 5 MG tablet Take 1 tablet (5 mg total) by mouth at bedtime. 45 tablet 3  . sertraline (ZOLOFT) 100 MG tablet Take 1 tablet (100 mg total) by mouth daily. 90 tablet 3  . valACYclovir (VALTREX) 500 MG tablet Take 1 tablet (500 mg total) by mouth daily. 90 tablet 3   No current facility-administered medications on file prior to visit.    Allergies  Allergen Reactions  . Mobic [Meloxicam]     GI bleed    Objective:  General: Alert and oriented x3 in no acute distress  Dermatology: No open lesions bilateral lower extremities, no webspace macerations, no ecchymosis bilateral, all nails x 10 are well manicured.  Vascular: Dorsalis Pedis and Posterior Tibial pedal pulses 1/4, Capillary Fill Time 3 seconds, + pedal hair growth bilateral, no edema bilateral lower extremities, Temperature gradient within normal limits.  Neurology: Johney Maine  sensation intact via light touch bilateral.  Musculoskeletal: Mild tenderness with palpation at medial arch bilateral L>R. No pain to calf. Strength within normal limits.   Assessment and Plan: Problem List Items Addressed This Visit    None    Visit Diagnoses    Pes planus of both feet    -  Primary   Tendonitis       Fasciitis       Foot pain, bilateral          -Complete examination performed -Previous Xrays reviewed -Discussed treatment options -Rx custom functional foot orthotics patient to see Liliane Channel today for orthotics -Continue with gentle stretching and good supportive shoes daily -Patient to return to office for pick up of orthotics or sooner if condition worsens.  Landis Martins, DPM

## 2019-04-21 NOTE — Progress Notes (Signed)
Patient is being seen today for f/o to address congential pes planus/pes planovalgus. Patient is active youth and demonstrates over pronation in gait, prominent medially shifted talus, and collapse of medial column.  Goals are RF stability, longitudinal arch support, decrease in pronation, and ease of discomfort in mobility related activities.   

## 2019-04-27 ENCOUNTER — Ambulatory Visit: Payer: 59 | Admitting: Physical Therapy

## 2019-05-06 ENCOUNTER — Ambulatory Visit
Admission: RE | Admit: 2019-05-06 | Discharge: 2019-05-06 | Disposition: A | Discharge status: Home / Self Care | Payer: 59 | Source: Ambulatory Visit | Attending: Nurse Practitioner | Admitting: Nurse Practitioner

## 2019-05-06 ENCOUNTER — Other Ambulatory Visit: Payer: Self-pay

## 2019-05-06 DIAGNOSIS — Z1231 Encounter for screening mammogram for malignant neoplasm of breast: Secondary | ICD-10-CM

## 2019-05-07 ENCOUNTER — Encounter

## 2019-05-10 ENCOUNTER — Telehealth: Payer: Self-pay | Admitting: Physical Therapy

## 2019-05-10 ENCOUNTER — Encounter: Payer: 59 | Admitting: Physical Therapy

## 2019-05-10 NOTE — Telephone Encounter (Signed)
Called patient as she did not show for her appt.  She reports she forgot and apologized for forgetting. Reminded of next appt time.  [name], PT, DPT 05/10/19 8:24 AM [facility] [address] [place], [place], [address] Phone: [phone]   Fax:  [phone]

## 2019-05-12 ENCOUNTER — Encounter: Payer: Self-pay | Admitting: Physical Therapy

## 2019-05-12 ENCOUNTER — Ambulatory Visit (INDEPENDENT_AMBULATORY_CARE_PROVIDER_SITE_OTHER): Payer: 59 | Admitting: Physical Therapy

## 2019-05-12 ENCOUNTER — Other Ambulatory Visit: Payer: Self-pay

## 2019-05-12 DIAGNOSIS — M25661 Stiffness of right knee, not elsewhere classified: Secondary | ICD-10-CM

## 2019-05-12 DIAGNOSIS — M25561 Pain in right knee: Secondary | ICD-10-CM | POA: Diagnosis not present

## 2019-05-12 DIAGNOSIS — G8929 Other chronic pain: Secondary | ICD-10-CM | POA: Diagnosis not present

## 2019-05-12 DIAGNOSIS — M6281 Muscle weakness (generalized): Secondary | ICD-10-CM

## 2019-05-12 DIAGNOSIS — R600 Localized edema: Secondary | ICD-10-CM | POA: Diagnosis not present

## 2019-05-12 NOTE — Therapy (Signed)
[facility] [address] [place], [place], [address] Phone: [phone]   Fax:  [phone]  Physical Therapy Treatment  Patient Details  Name: Alejandra Crane MRN: [id number] Date of Birth: October 14, 1950 Referring Provider (PT): Tarry Kos, MD   Encounter Date: 05/12/2019  PT End of Session - 05/12/19 0841    Visit Number  2    Number of Visits  12    Date for PT Re-Evaluation  06/23/19   extended due to missed weeks   PT Start Time  0803    PT Stop Time  0848    PT Time Calculation (min)  45 min    Activity Tolerance  Patient tolerated treatment well    Behavior During Therapy  [facility] for tasks assessed/performed       Past Medical History:  Diagnosis Date  . Arthritis   . Depression   . Diverticulosis    noted on colonscopy   . Elevated cholesterol   . H/O mammogram    2015, 2016  . 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 High blood pressure   . History of esophagogastroduodenoscopy (EGD)    2014, 2016  . History of PFTs 10/26/2012  . Lower GI bleed 01/19/2015   Related to mobic 15 mg   . Mitral valve prolapse 09/06/1996  . Osteoarthritis of both knees     Past Surgical History:  Procedure Laterality Date  . BREAST EXCISIONAL BIOPSY Left    benign  . BREAST SURGERY  05/20/2007   [facility], Dr.William Purkert   . COLONOSCOPY     2014, 2016 Dr.Bajani Manalo, Dr.Mahmood Abedi     There were no vitals filed for this visit.  Subjective Assessment - 05/12/19 0806    Subjective  doing well; Rt knee feels tight today. doing exercises once a day, knee is still a little swollen.  pain is about the same.    Limitations  Standing;Walking    How long can you walk comfortably?  2 miles    Patient Stated Goals  improve pain, maintain mobility, "last time I thought is was a bunch of crap, but it really made a difference"    Pain Score  3     Pain Location  Knee    Pain Orientation  Right    Pain Descriptors / Indicators  Aching;Tightness   stiff    Pain Onset  More than a month ago    Pain Frequency  Intermittent    Aggravating Factors   standing, walking, stairs (doesn't have at this time)    Pain Relieving Factors  ice                       [facility] Adult PT Treatment/Exercise - 05/12/19 0809      Knee/Hip Exercises: Stretches   Quad Stretch  Right;3 reps;30 seconds    Quad Stretch Limitations  trial of supine/sidelying/prone - pt prefers supine      Knee/Hip Exercises: Aerobic   Nustep  L5 x 6 min      Knee/Hip Exercises: Seated   Long Arc Quad  Right;15 reps    Long Arc Quad Limitations  5 sec hold      Knee/Hip Exercises: Supine   Bridges  Both;15 reps    Straight Leg Raises  Right;15 reps      Knee/Hip Exercises: Sidelying   Hip ABduction  Right;15 reps    Hip ABduction Limitations  with extension  Knee/Hip Exercises: Prone   Straight Leg Raises  Right;15 reps      Modalities   Modalities  Cryotherapy;Electrical Stimulation      Cryotherapy   Number Minutes Cryotherapy  12 Minutes    Cryotherapy Location  Knee    Type of Cryotherapy  Ice pack      Electrical Stimulation   Electrical Stimulation Location  Rt knee    Electrical Stimulation Action  IFC    Electrical Stimulation Parameters  to tolerance    Electrical Stimulation Goals  Edema;Pain             PT Education - 05/12/19 [phone]    Education Details  added quad stretch    Person(s) Educated  Patient    Methods  Explanation;Demonstration;Handout    Comprehension  Verbalized understanding;Returned demonstration;Need further instruction          PT Long Term Goals - 04/14/19 1418      PT LONG TERM GOAL #1   Title  independent with HEP    Status  New    Target Date  05/26/19      PT LONG TERM GOAL #2   Title  report pain < 4/10 with activity for improved function    Status  New    Target Date  05/26/19      PT LONG TERM GOAL #3   Title  demonstrate at least 4+/5 Rt hip/knee strength for improved function     Status  New    Target Date  05/26/19      PT LONG TERM GOAL #4   Title  report ability to continue to walk 2+ miles without increase in pain for improved function    Status  New    Target Date  05/26/19            Plan - 05/12/19 0843    Clinical Impression Statement  Pt tolerated session well today needing min cues for HEP.  Will continue to benefit from PT to maximize function.    Personal Factors and Comorbidities  Comorbidity 3+;Past/Current Experience    Comorbidities  OA, depression, anxiety, herpes, HTN, mitral valve prolapse    Examination-Activity Limitations  Stand;Locomotion Level;Transfers;Squat;Stairs    Examination-Participation Restrictions  Other   exercise, occupation   Stability/Clinical Decision Making  Evolving/Moderate complexity    Rehab Potential  Good    PT Frequency  2x / week    PT Duration  6 weeks    PT Treatment/Interventions  ADLs/Self Care Home Management;Cryotherapy;Electrical Stimulation;Ultrasound;Moist Heat;Iontophoresis 4mg /ml Dexamethasone;Gait training;Stair training;Functional mobility training;Therapeutic activities;Therapeutic exercise;Balance training;Patient/family education;Neuromuscular re-education;Manual techniques;Vasopneumatic Device;Taping;Dry needling;Passive range of motion    PT Next Visit Plan  review HEP, progress strengthening (open chain then progressing to closed chain as pain improves), modalities PRN for pain    PT Home Exercise Plan  Access Code: [email or web address]    Consulted and Agree with Plan of Care  Patient       Patient will benefit from skilled therapeutic intervention in order to improve the following deficits and impairments:  Abnormal gait, Pain, Postural dysfunction, Impaired flexibility, Decreased range of motion, Decreased strength, Increased edema, Difficulty walking, Decreased mobility  Visit Diagnosis: Chronic pain of right knee  Stiffness of right knee, not elsewhere classified  Localized edema  Muscle  weakness (generalized)     Problem List Patient Active Problem List   Diagnosis Date Noted  . Osteopenia 09/16/2018  . Essential hypertension 02/19/2017  . Mixed hyperlipidemia 02/19/2017  . Depression, major, single episode,  complete remission ([facility]) 02/19/2017  . Herpes genitalis in women 02/19/2017       [name], PT, DPT 05/12/19 8:44 AM     [facility] [address] [place], [place], [address] Phone: [phone]   Fax:  [phone]  Name: Mercia Dowe MRN: [id number] Date of Birth: 1951-04-23

## 2019-05-12 NOTE — Patient Instructions (Signed)
Access Code: [email or web address]  URL: [email or web address]  Date: 05/12/2019  Prepared by: Moshe Cipro   Exercises Supine Bridge - 10 reps - 1 sets - 5 sec hold - 2x daily - 7x weekly Supine Straight Leg Raises - 10 reps - 1 sets - 2x daily - 7x weekly                  Sidelying Hip Extension in Abduction - 10 reps - 1 sets - 2x daily - 7x weekly Seated Long Arc Quad - 10 reps - 1 sets - 5 sec hold - 2x daily - 7x weekly Supine Quadriceps Stretch with Strap on Table - 10 reps - 3 sets - 1x daily - 7x weekly Patient Education TENS Unit

## 2019-05-13 ENCOUNTER — Encounter: Payer: Self-pay | Admitting: Neurology

## 2019-05-14 NOTE — Progress Notes (Signed)
Virtual Visit via Video Note The purpose of this virtual visit is to provide medical care while limiting exposure to the novel coronavirus.    Consent was obtained for video visit:  Yes.   Answered questions that patient had about telehealth interaction:  Yes.   I discussed the limitations, risks, security and privacy concerns of performing an evaluation and management service by telemedicine. I also discussed with the patient that there may be a patient responsible charge related to this service. The patient expressed understanding and agreed to proceed.  Pt location: Home Physician Location: office Name of referring provider:  Lauree Chandler, NP I connected with Alejandra Crane at patients initiation/request on 05/17/2019 at  1:50 PM EST by video enabled telemedicine application and verified that I am speaking with the correct person using two identifiers. Pt MRN:  [id number] Pt DOB:  [birth year] Video Participants:  Alejandra Crane   History of Present Illness:  Alejandra Crane is a 69 year old female who presents for headaches.  History supplemented by referring provider note.  She began having headaches in October.  She describes 3-7/10 bifrontal or parietal non-throbbing headache but location varies.  No associated nausea, vomiting, visual disturbance, dizziness, photophobia, phonophobia, slurred speech or unilateral numbness or weakness.  They typically last 1 to 2 hours hours and occur daily.  Triggers may include emotional stress but unsure.  No relieving factors.    Eye exam reportedly okay.  She denies prior history of headaches.  She cannot identify any preceding cause.  Depression is in remission.  Blood pressure has been controlled.  CBC and CMP from 03/15/2019 was normal.  She has not taken any analgesic.   Current NSAIDS:  Unable to take due to history of GI bleed. Current analgesics:  none Current triptans:  none Current ergotamine:  none Current anti-emetic:   none Current muscle relaxants:  none Current anti-anxiolytic:  none Current sleep aide:  none Current Antihypertensive medications:  HCTZ Current Antidepressant medications:  Sertraline 100mg  daily Current Anticonvulsant medications:  none Current anti-CGRP:  none Current Vitamins/Herbal/Supplements:  Caltrate Current Antihistamines/Decongestants:  none Other therapy:  none Hormone/birth control:  none   Past Medical History: Past Medical History:  Diagnosis Date  . Arthritis   . Depression   . Diverticulosis    noted on colonscopy   . Elevated cholesterol   . H/O mammogram    2015, 2016  . 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 High blood pressure   . History of esophagogastroduodenoscopy (EGD)    2014, 2016  . History of PFTs 10/26/2012  . Lower GI bleed 01/19/2015   Related to mobic 15 mg   . Mitral valve prolapse 09/06/1996  . Osteoarthritis of both knees     Medications: Outpatient Encounter Medications as of 05/17/2019  Medication Sig  . Calcium Carbonate-Vitamin D (CALTRATE 600+D) 600-400 MG-UNIT tablet Take 1 tablet by mouth 2 (two) times daily.  . Diclofenac Sodium (PENNSAID) 2 % SOLN Apply 2 g topically 2 (two) times daily as needed (to affected area).  . hydrochlorothiazide (HYDRODIURIL) 25 MG tablet Take 1 tablet (25 mg total) by mouth daily.  . rosuvastatin (CRESTOR) 5 MG tablet Take 1 tablet (5 mg total) by mouth at bedtime.  . sertraline (ZOLOFT) 100 MG tablet Take 1 tablet (100 mg total) by mouth daily.  . valACYclovir (VALTREX) 500 MG tablet Take 1 tablet (500 mg total) by mouth daily.   No facility-administered encounter medications on file as of 05/17/2019.  Allergies: Allergies  Allergen Reactions  . Mobic [Meloxicam]     GI bleed    Family History: Family History  Problem Relation Age of Onset  . Heart attack Mother   . CVA Mother   . Diabetes Mother   . Congestive Heart Failure Mother   . Stroke Mother   . Heart disease Mother   . Lung cancer  Sister 68  . Cancer Brother        GI or colon cancer  . Neurologic Disorder Son   . Cancer Other        Niece-colon cancer  . Diabetes Other        Nephew    Social History: Social History   Socioeconomic History  . Marital status: Divorced    Spouse name: Not on file  . Number of children: Not on file  . Years of education: Not on file  . Highest education level: Not on file  Occupational History  . Not on file  Tobacco Use  . Smoking status: Former Smoker    Packs/day: 0.25    Years: 20.00    Pack years: 5.00  . Smokeless tobacco: Never Used  . Tobacco comment: Quit in her 49's   Substance and Sexual Activity  . Alcohol use: Yes    Comment: Occasionally red wine   . Drug use: No  . Sexual activity: Not Currently  Other Topics Concern  . Not on file  Social History Narrative   Diet: Regular       Caffeine: Yes, infrequently when working       Married, if yes what year: Divorced, married in 1974      Do you live in a house, apartment, assisted living, condo, trailer, ect: House, 1 stories, one person       Pets: None      Current/Past profession:       Exercise: Yes, walking, hiking, and gym    Right handed   One story home   Drinks caffeine, occasionally weekly      Living Will: No   DNR: No   POA/HPOA: No      Questions below not in new patient packet    Functional Status:   Do you have difficulty bathing or dressing yourself?   Do you have difficulty preparing food or eating?   Do you have difficulty managing your medications?   Do you have difficulty managing your finances?   Do you have difficulty affording your medications?   Social Determinants of Health   Financial Resource Strain:   . Difficulty of Paying Living Expenses: Not on file  Food Insecurity:   . Worried About Programme researcher, broadcasting/film/video in the Last Year: Not on file  . Ran Out of Food in the Last Year: Not on file  Transportation Needs:   . Lack of Transportation (Medical): Not on  file  . Lack of Transportation (Non-Medical): Not on file  Physical Activity:   . Days of Exercise per Week: Not on file  . Minutes of Exercise per Session: Not on file  Stress:   . Feeling of Stress : Not on file  Social Connections:   . Frequency of Communication with Friends and Family: Not on file  . Frequency of Social Gatherings with Friends and Family: Not on file  . Attends Religious Services: Not on file  . Active Member of Clubs or Organizations: Not on file  . Attends Banker Meetings: Not on file  .  Marital Status: Not on file  Intimate Partner Violence:   . Fear of Current or Ex-Partner: Not on file  . Emotionally Abused: Not on file  . Physically Abused: Not on file  . Sexually Abused: Not on file    Observations/Objective:   Height 5\' 3"  (1.6 m), weight 200 lb (90.7 kg). No acute distress.  Alert and oriented.  Speech fluent and not dysarthric.  Language intact.  Eyes orthophoric on primary gaze.  Face symmetric.  Assessment and Plan:   New onset daily headache.  As patient is over 50 and with no prior history of headache, testing is warranted.  1.  MRI of brain with and without contrast 2.  Sed rate 3.  She defers possible medication until after testing completed. 4.  Advised okay to take Tylenol but limit to no more than 2 days out of week to prevent rebound headache. 5.  Further recommendations pending results.  Follow Up Instructions:    -I discussed the assessment and treatment plan with the patient. The patient was provided an opportunity to ask questions and all were answered. The patient agreed with the plan and demonstrated an understanding of the instructions.   The patient was advised to call back or seek an in-person evaluation if the symptoms worsen or if the condition fails to improve as anticipated.    , DO

## 2019-05-17 ENCOUNTER — Encounter: Payer: Self-pay | Admitting: Physical Therapy

## 2019-05-17 ENCOUNTER — Other Ambulatory Visit: Payer: Self-pay

## 2019-05-17 ENCOUNTER — Telehealth (INDEPENDENT_AMBULATORY_CARE_PROVIDER_SITE_OTHER): Payer: 59 | Admitting: Neurology

## 2019-05-17 ENCOUNTER — Ambulatory Visit (INDEPENDENT_AMBULATORY_CARE_PROVIDER_SITE_OTHER): Payer: 59 | Admitting: Physical Therapy

## 2019-05-17 ENCOUNTER — Encounter: Payer: Self-pay | Admitting: Neurology

## 2019-05-17 VITALS — Ht 63.0 in | Wt 200.0 lb

## 2019-05-17 DIAGNOSIS — R600 Localized edema: Secondary | ICD-10-CM

## 2019-05-17 DIAGNOSIS — M25561 Pain in right knee: Secondary | ICD-10-CM

## 2019-05-17 DIAGNOSIS — G8929 Other chronic pain: Secondary | ICD-10-CM | POA: Diagnosis not present

## 2019-05-17 DIAGNOSIS — R519 Headache, unspecified: Secondary | ICD-10-CM

## 2019-05-17 DIAGNOSIS — M6281 Muscle weakness (generalized): Secondary | ICD-10-CM | POA: Diagnosis not present

## 2019-05-17 DIAGNOSIS — M25661 Stiffness of right knee, not elsewhere classified: Secondary | ICD-10-CM | POA: Diagnosis not present

## 2019-05-17 NOTE — Therapy (Signed)
[facility] [address] [place], [place], [address] Phone: [phone]   Fax:  [phone]  Physical Therapy Treatment  Patient Details  Name: Alejandra Crane MRN: [id number] Date of Birth: 11-06-1950 Referring Provider (PT): Tarry Kos, MD   Encounter Date: 05/17/2019  PT End of Session - 05/17/19 0839    Visit Number  3    Number of Visits  12    Date for PT Re-Evaluation  06/23/19   extended due to missed weeks   PT Start Time  0805   pt arrived late   PT Stop Time  0847    PT Time Calculation (min)  42 min    Activity Tolerance  Patient tolerated treatment well    Behavior During Therapy  [facility] for tasks assessed/performed       Past Medical History:  Diagnosis Date  . Arthritis   . Depression   . Diverticulosis    noted on colonscopy   . Elevated cholesterol   . H/O mammogram    2015, 2016  . 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 High blood pressure   . History of esophagogastroduodenoscopy (EGD)    2014, 2016  . History of PFTs 10/26/2012  . Lower GI bleed 01/19/2015   Related to mobic 15 mg   . Mitral valve prolapse 09/06/1996  . Osteoarthritis of both knees     Past Surgical History:  Procedure Laterality Date  . BREAST EXCISIONAL BIOPSY Left    benign  . BREAST SURGERY  05/20/2007   [facility], Dr.William Purkert   . COLONOSCOPY     2014, 2016 Dr.Bajani Manalo, Dr.Mahmood Abedi     There were no vitals filed for this visit.  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 knee feels about the same, did order TENS unit and stretch strap for home    Limitations  Standing;Walking    How long can you walk comfortably?  2 miles    Patient Stated Goals  improve pain, maintain mobility, "last time I thought is was a bunch of crap, but it really made a difference"    Currently in Pain?  No/denies    Pain Onset  More than a month ago                        [facility] Adult PT Treatment/Exercise - 05/17/19 0809      Knee/Hip Exercises: Stretches   Quad Stretch  Right;3 reps;30 seconds    Quad Stretch Limitations  supine with foot off mat; with strap      Knee/Hip Exercises: Aerobic   Nustep  L5 x 6 min      Knee/Hip Exercises: Machines for Strengthening   Total Gym Leg Press  75# 2x10      Knee/Hip Exercises: Seated   Long Arc Quad  Right;2 sets;10 reps    Long Arc Quad Weight  3 lbs.    Long [employer] Limitations  5 sec hold      Knee/Hip Exercises: Supine   Short Arc [employer]  Right;2 sets;10 reps    Short Arc Quad Sets Limitations  3#; 5 sec hold    Bridges with [employer]  2 sets;10 reps    Straight Leg Raise with External Rotation  Right;2 sets;10 reps    Straight Leg Raise with External Rotation Limitations  3#      Knee/Hip Exercises:  Sidelying   Clams  Rt; 2x10; L2 band      Cryotherapy   Number Minutes Cryotherapy  12 Minutes    Cryotherapy Location  Knee    Type of Cryotherapy  Ice pack      Electrical Stimulation   Electrical Stimulation Location  Rt knee    Electrical Stimulation Action  IFC    Electrical Stimulation Parameters  to tolerance    Electrical Stimulation Goals  Edema;Pain                  PT Long Term Goals - 04/14/19 1418      PT LONG TERM GOAL #1   Title  independent with HEP    Status  New    Target Date  05/26/19      PT LONG TERM GOAL #2   Title  report pain < 4/10 with activity for improved function    Status  New    Target Date  05/26/19      PT LONG TERM GOAL #3   Title  demonstrate at least 4+/5 Rt hip/knee strength for improved function    Status  New    Target Date  05/26/19      PT LONG TERM GOAL #4   Title  report ability to continue to walk 2+ miles without increase in pain for improved function    Status  New    Target Date  05/26/19            Plan - 05/17/19 0839    Clinical Impression Statement  Pt tolerated  progression of strengthening exercises well without pain, needing min cues for technique with exercises.  Will continue to benefit from PT to maximize function.    Personal Factors and Comorbidities  Comorbidity 3+;Past/Current Experience    Comorbidities  OA, depression, anxiety, herpes, HTN, mitral valve prolapse    Examination-Activity Limitations  Stand;Locomotion Level;Transfers;Squat;Stairs    Examination-Participation Restrictions  Other   exercise, occupation   Stability/Clinical Decision Making  Evolving/Moderate complexity    Rehab Potential  Good    PT Frequency  2x / week    PT Duration  6 weeks    PT Treatment/Interventions  ADLs/Self Care Home Management;Cryotherapy;Electrical Stimulation;Ultrasound;Moist Heat;Iontophoresis 4mg /ml Dexamethasone;Gait training;Stair training;Functional mobility training;Therapeutic activities;Therapeutic exercise;Balance training;Patient/family education;Neuromuscular re-education;Manual techniques;Vasopneumatic Device;Taping;Dry needling;Passive range of motion    PT Next Visit Plan  review HEP, progress strengthening (open chain then progressing to closed chain as pain improves), modalities PRN for pain    PT Home Exercise Plan  Access Code: [email or web address]    Consulted and Agree with Plan of Care  Patient       Patient will benefit from skilled therapeutic intervention in order to improve the following deficits and impairments:  Abnormal gait, Pain, Postural dysfunction, Impaired flexibility, Decreased range of motion, Decreased strength, Increased edema, Difficulty walking, Decreased mobility  Visit Diagnosis: Chronic pain of right knee  Stiffness of right knee, not elsewhere classified  Localized edema  Muscle weakness (generalized)     Problem List Patient Active Problem List   Diagnosis Date Noted  . Osteopenia 09/16/2018  . Essential hypertension 02/19/2017  . Mixed hyperlipidemia 02/19/2017  . Depression, major, single episode,  complete remission ([facility]) 02/19/2017  . Herpes genitalis in women 02/19/2017      02/21/2017, PT, DPT 05/17/19 8:40 AM     [facility] [address] [place], [place], [place] Phone: [phone]   Fax:  [phone]  Name: Alejandra Crane MRN: [id number] Date of Birth: 07-23-1950

## 2019-05-19 ENCOUNTER — Other Ambulatory Visit: Payer: Self-pay

## 2019-05-19 ENCOUNTER — Ambulatory Visit (INDEPENDENT_AMBULATORY_CARE_PROVIDER_SITE_OTHER): Payer: 59 | Admitting: Physical Therapy

## 2019-05-19 ENCOUNTER — Other Ambulatory Visit (INDEPENDENT_AMBULATORY_CARE_PROVIDER_SITE_OTHER): Payer: 59

## 2019-05-19 ENCOUNTER — Encounter: Payer: Self-pay | Admitting: Physical Therapy

## 2019-05-19 DIAGNOSIS — M25561 Pain in right knee: Secondary | ICD-10-CM | POA: Diagnosis not present

## 2019-05-19 DIAGNOSIS — R519 Headache, unspecified: Secondary | ICD-10-CM | POA: Diagnosis not present

## 2019-05-19 DIAGNOSIS — M25661 Stiffness of right knee, not elsewhere classified: Secondary | ICD-10-CM

## 2019-05-19 DIAGNOSIS — M6281 Muscle weakness (generalized): Secondary | ICD-10-CM | POA: Diagnosis not present

## 2019-05-19 DIAGNOSIS — R600 Localized edema: Secondary | ICD-10-CM | POA: Diagnosis not present

## 2019-05-19 DIAGNOSIS — G8929 Other chronic pain: Secondary | ICD-10-CM | POA: Diagnosis not present

## 2019-05-19 LAB — SEDIMENTATION RATE: Sed Rate: 29 mm/h (ref 0–30)

## 2019-05-19 NOTE — Therapy (Signed)
[facility] [address] [place], [place], [address] Phone: [phone]   Fax:  [phone]  Physical Therapy Treatment  Patient Details  Name: Alejandra Crane MRN: [id number] Date of Birth: June 20, 1950 Referring Provider (PT): Tarry Kos, MD   Encounter Date: 05/19/2019  PT End of Session - 05/19/19 0926    Visit Number  4    Number of Visits  12    Date for PT Re-Evaluation  06/23/19   extended due to missed weeks   PT Start Time  0850    PT Stop Time  0935    PT Time Calculation (min)  45 min    Activity Tolerance  Patient tolerated treatment well    Behavior During Therapy  [facility] for tasks assessed/performed       Past Medical History:  Diagnosis Date  . Arthritis   . Depression   . Diverticulosis    noted on colonscopy   . Elevated cholesterol   . H/O mammogram    2015, 2016  . 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 High blood pressure   . History of esophagogastroduodenoscopy (EGD)    2014, 2016  . History of PFTs 10/26/2012  . Lower GI bleed 01/19/2015   Related to mobic 15 mg   . Mitral valve prolapse 09/06/1996  . Osteoarthritis of both knees     Past Surgical History:  Procedure Laterality Date  . BREAST EXCISIONAL BIOPSY Left    benign  . BREAST SURGERY  05/20/2007   [facility], Dr.William Purkert   . COLONOSCOPY     2014, 2016 Dr.Bajani Manalo, Dr.Mahmood Abedi     There were no vitals filed for this visit.  Subjective Assessment - 05/19/19 0853    Subjective  knee feels good after leaving here lasting most of the day, overall though pain returns and is about the same    Limitations  Standing;Walking    How long can you walk comfortably?  2 miles    Patient Stated Goals  improve pain, maintain mobility, "last time I thought is was a bunch of crap, but it really made a difference"    Currently in Pain?  No/denies    Pain Onset  More than a month ago                       [facility] Adult PT  Treatment/Exercise - 05/19/19 0854      Knee/Hip Exercises: Stretches   Passive Hamstring Stretch  Right;3 reps;30 seconds    Passive Hamstring Stretch Limitations  supine with strap    Quad Stretch  Right;3 reps;30 seconds    Quad Stretch Limitations  supine with foot off mat; with strap      Knee/Hip Exercises: Aerobic   Nustep  L5 x 6 min      Knee/Hip Exercises: Machines for Strengthening   Total Gym Leg Press  75# 3x10      Knee/Hip Exercises: Standing   Other Standing Knee Exercises  deadlift with L5 band 2x10; mod cues for technique      Knee/Hip Exercises: Supine   Bridges  Both;15 reps    Bridges Limitations  feet on red physioball    Straight Leg Raise with External Rotation  Right;2 sets;10 reps    Straight Leg Raise with External Rotation Limitations  3#      Cryotherapy   Number Minutes Cryotherapy  12 Minutes    Cryotherapy Location  Knee    Type  of Cryotherapy  Ice pack      Acupuncturist Location  Rt knee    Electrical Stimulation Action  IFC    Electrical Stimulation Parameters  to tolerance    Electrical Stimulation Goals  Edema;Pain                  PT Long Term Goals - 04/14/19 1418      PT LONG TERM GOAL #1   Title  independent with HEP    Status  New    Target Date  05/26/19      PT LONG TERM GOAL #2   Title  report pain < 4/10 with activity for improved function    Status  New    Target Date  05/26/19      PT LONG TERM GOAL #3   Title  demonstrate at least 4+/5 Rt hip/knee strength for improved function    Status  New    Target Date  05/26/19      PT LONG TERM GOAL #4   Title  report ability to continue to walk 2+ miles without increase in pain for improved function    Status  New    Target Date  05/26/19            Plan - 05/19/19 1048    Clinical Impression Statement  Pt reports she feels better after physical therapy sessions, and benefit lasting about a day before symptoms return.   Will benefit from continued PT to maximize function.  Plan to update HEP next session to progress strengthening exercises.    Personal Factors and Comorbidities  Comorbidity 3+;Past/Current Experience    Comorbidities  OA, depression, anxiety, herpes, HTN, mitral valve prolapse    Examination-Activity Limitations  Stand;Locomotion Level;Transfers;Squat;Stairs    Examination-Participation Restrictions  Other   exercise, occupation   Stability/Clinical Decision Making  Evolving/Moderate complexity    Rehab Potential  Good    PT Frequency  2x / week    PT Duration  6 weeks    PT Treatment/Interventions  ADLs/Self Care Home Management;Cryotherapy;Electrical Stimulation;Ultrasound;Moist Heat;Iontophoresis 4mg /ml Dexamethasone;Gait training;Stair training;Functional mobility training;Therapeutic activities;Therapeutic exercise;Balance training;Patient/family education;Neuromuscular re-education;Manual techniques;Vasopneumatic Device;Taping;Dry needling;Passive range of motion    PT Next Visit Plan  review HEP, progress strengthening (open chain then progressing to closed chain as pain improves), modalities PRN for pain; add additional strengthening exercises to HEP    PT Home Exercise Plan  Access Code: [email or web address]    Consulted and Agree with Plan of Care  Patient       Patient will benefit from skilled therapeutic intervention in order to improve the following deficits and impairments:  Abnormal gait, Pain, Postural dysfunction, Impaired flexibility, Decreased range of motion, Decreased strength, Increased edema, Difficulty walking, Decreased mobility  Visit Diagnosis: Chronic pain of right knee  Stiffness of right knee, not elsewhere classified  Localized edema  Muscle weakness (generalized)     Problem List Patient Active Problem List   Diagnosis Date Noted  . Osteopenia 09/16/2018  . Essential hypertension 02/19/2017  . Mixed hyperlipidemia 02/19/2017  . Depression, major, single  episode, complete remission ([place]) 02/19/2017  . Herpes genitalis in women 02/19/2017       Laureen Abrahams, PT, DPT 05/19/19 10:50 AM    [facility] [address] [place], [place], [address] Phone: [phone]   Fax:  [phone]  Name: Carisma Troupe MRN: [id number] Date of Birth: 29-Apr-1951

## 2019-05-20 ENCOUNTER — Other Ambulatory Visit: Payer: Self-pay | Admitting: Orthotics

## 2019-05-28 ENCOUNTER — Other Ambulatory Visit: Payer: Self-pay

## 2019-05-28 ENCOUNTER — Ambulatory Visit (INDEPENDENT_AMBULATORY_CARE_PROVIDER_SITE_OTHER): Payer: 59 | Admitting: Physical Therapy

## 2019-05-28 ENCOUNTER — Encounter: Payer: Self-pay | Admitting: Physical Therapy

## 2019-05-28 DIAGNOSIS — G8929 Other chronic pain: Secondary | ICD-10-CM | POA: Diagnosis not present

## 2019-05-28 DIAGNOSIS — M6281 Muscle weakness (generalized): Secondary | ICD-10-CM | POA: Diagnosis not present

## 2019-05-28 DIAGNOSIS — M25561 Pain in right knee: Secondary | ICD-10-CM

## 2019-05-28 DIAGNOSIS — M25661 Stiffness of right knee, not elsewhere classified: Secondary | ICD-10-CM

## 2019-05-28 DIAGNOSIS — R600 Localized edema: Secondary | ICD-10-CM

## 2019-05-28 NOTE — Therapy (Addendum)
[place], [place], [address] Phone: [phone]   Fax:  [phone]  Physical Therapy Treatment/Discharge Summary  Patient Details  Name: Alejandra Crane MRN: [id number] Date of Birth: 01-03-1951 Referring Provider (PT): Leandrew Koyanagi, MD   Encounter Date: 05/28/2019  PT End of Session - 05/28/19 0855    Visit Number  5    Number of Visits  12    Date for PT Re-Evaluation  --   extended due to missed weeks   PT Start Time  0803    PT Stop Time  0843    PT Time Calculation (min)  40 min    Activity Tolerance  Patient tolerated treatment well    Behavior During Therapy  [facility] for tasks assessed/performed       Past Medical History:  Diagnosis Date  . Arthritis   . Depression   . Diverticulosis    noted on colonscopy   . Elevated cholesterol   . H/O mammogram    2015, 2016  . 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 High blood pressure   . History of esophagogastroduodenoscopy (EGD)    2014, 2016  . History of PFTs 10/26/2012  . Lower GI bleed 01/19/2015   Related to mobic 15 mg   . Mitral valve prolapse 09/06/1996  . Osteoarthritis of both knees     Past Surgical History:  Procedure Laterality Date  . BREAST EXCISIONAL BIOPSY Left    benign  . BREAST SURGERY  05/20/2007   [facility], Dr.William Purkert   . COLONOSCOPY     2014, 2016 Dr.Bajani Manalo, Dr.Mahmood Abedi     There were no vitals filed for this visit.  Subjective Assessment - 05/28/19 0807    Subjective  brought her home TENS unit; knee is doing well.    Limitations  Standing;Walking    How long can you walk comfortably?  2 miles    Patient Stated Goals  improve pain, maintain mobility, "last time I thought is was a bunch of crap, but it really made a difference"    Currently in Pain?  No/denies    Pain Onset  More than a month ago         [facility] PT Assessment - 05/28/19 0820      Assessment   Medical Diagnosis  M25.561 (ICD-10-CM) - Right knee  pain, unspecified chronicity    Referring Provider (PT)  Leandrew Koyanagi, MD      Strength   Right Hip Flexion  5/5    Right Hip Extension  4-/5    Right Hip ABduction  4/5    Right Knee Flexion  4+/5    Right Knee Extension  5/5                   [facility] Adult PT Treatment/Exercise - 05/28/19 0810      Self-Care   Self-Care  Other Self-Care Comments    Other Self-Care Comments   educated on use of home TENS machine and application, set to appropriate settings, provided gym equipment appropriate exercises for pt.      Knee/Hip Exercises: Stretches   Passive Hamstring Stretch  Right;3 reps;30 seconds    Passive Hamstring Stretch Limitations  supine with strap    Quad Stretch  Right;3 reps;30 seconds    Quad Stretch Limitations  supine with foot off mat; with strap      Knee/Hip Exercises: Aerobic   Nustep  L6 x 6 min  Knee/Hip Exercises: Seated   Long Arc Quad  Right;10 reps    Long Arc Quad Limitations  5 sec hold      Knee/Hip Exercises: Supine   Bridges  Both;10 reps    Straight Leg Raises  Right;15 reps             PT Education - 05/28/19 0855    Education Details  see self care    Person(s) Educated  Patient    Methods  Explanation;Demonstration;Handout    Comprehension  Verbalized understanding;Returned demonstration;Need further instruction          PT Long Term Goals - 05/28/19 0855      PT LONG TERM GOAL #1   Title  independent with HEP    Status  Achieved      PT LONG TERM GOAL #2   Title  report pain < 4/10 with activity for improved function    Status  Achieved      PT LONG TERM GOAL #3   Title  demonstrate at least 4+/5 Rt hip/knee strength for improved function    Status  Achieved      PT LONG TERM GOAL #4   Title  report ability to continue to walk 2+ miles without increase in pain for improved function    Status  Achieved            Plan - 05/28/19 0855    Clinical Impression Statement  Pt has met all goals and  feels ready to hold from PT.  Will plan to hold PT x 30 days and reassess or d/c depending on if pt returns.    Personal Factors and Comorbidities  Comorbidity 3+;Past/Current Experience    Comorbidities  OA, depression, anxiety, herpes, HTN, mitral valve prolapse    Examination-Activity Limitations  Stand;Locomotion Level;Transfers;Squat;Stairs    Examination-Participation Restrictions  Other   exercise, occupation   Stability/Clinical Decision Making  Evolving/Moderate complexity    Rehab Potential  Good    PT Frequency  2x / week    PT Duration  6 weeks    PT Treatment/Interventions  ADLs/Self Care Home Management;Cryotherapy;Electrical Stimulation;Ultrasound;Moist Heat;Iontophoresis 29m/ml Dexamethasone;Gait training;Stair training;Functional mobility training;Therapeutic activities;Therapeutic exercise;Balance training;Patient/family education;Neuromuscular re-education;Manual techniques;Vasopneumatic Device;Taping;Dry needling;Passive range of motion    PT Next Visit Plan  hold PT x 30 days, reassess or d/c    PT Home Exercise Plan  Access Code: [email or web address]   Consulted and Agree with Plan of Care  Patient       Patient will benefit from skilled therapeutic intervention in order to improve the following deficits and impairments:  Abnormal gait, Pain, Postural dysfunction, Impaired flexibility, Decreased range of motion, Decreased strength, Increased edema, Difficulty walking, Decreased mobility  Visit Diagnosis: Chronic pain of right knee  Stiffness of right knee, not elsewhere classified  Localized edema  Muscle weakness (generalized)     Problem List Patient Active Problem List   Diagnosis Date Noted  . Osteopenia 09/16/2018  . Essential hypertension 02/19/2017  . Mixed hyperlipidemia 02/19/2017  . Depression, major, single episode, complete remission ([place] 02/19/2017  . Herpes genitalis in women 02/19/2017         SLaureen Abrahams PT, DPT 05/28/19 9:02  AM    [facility] [address] [place] [address]Phone: [phone]  Fax:  [phone] Name: Alejandra DelanceyMRN: [id number] of Birth: 808/24/1952     PHYSICAL THERAPY DISCHARGE SUMMARY  Visits from Start of Care: 5  Current  functional level related to goals / functional outcomes: See above   Remaining deficits: n/a   Education / Equipment: HEP  Plan: Patient agrees to discharge.  Patient goals were met. Patient is being discharged due to meeting the stated rehab goals.  ?????     Laureen Abrahams, PT, DPT 10/01/19 10:44 AM  [facility] [address] [place], [place], [address] Phone: [phone]   Fax:  [phone]

## 2019-05-31 ENCOUNTER — Encounter: Payer: 59 | Admitting: Physical Therapy

## 2019-06-04 ENCOUNTER — Encounter: Payer: 59 | Admitting: Physical Therapy

## 2019-06-09 ENCOUNTER — Encounter: Payer: 59 | Admitting: Physical Therapy

## 2019-06-10 ENCOUNTER — Other Ambulatory Visit: Payer: Self-pay | Admitting: Orthotics

## 2019-06-11 ENCOUNTER — Encounter: Payer: 59 | Admitting: Physical Therapy

## 2019-06-15 ENCOUNTER — Other Ambulatory Visit: Payer: Self-pay | Admitting: Nurse Practitioner

## 2019-06-15 DIAGNOSIS — I10 Essential (primary) hypertension: Secondary | ICD-10-CM

## 2019-06-15 DIAGNOSIS — F325 Major depressive disorder, single episode, in full remission: Secondary | ICD-10-CM

## 2019-06-15 MED FILL — SERTRALINE HCL 100 MG TAB: 100 | 90 days supply | Qty: 90 | Fill #0

## 2019-06-15 MED FILL — ROSUVASTATIN CALCIUM 5 MG T: 5 | 56 days supply | Qty: 24 | Fill #1

## 2019-06-15 MED FILL — HYDROCHLOROTHIAZIDE 25 MG T: 25 | 90 days supply | Qty: 90 | Fill #0

## 2019-06-16 ENCOUNTER — Other Ambulatory Visit: Payer: 59

## 2019-06-21 ENCOUNTER — Ambulatory Visit (INDEPENDENT_AMBULATORY_CARE_PROVIDER_SITE_OTHER): Payer: 59 | Admitting: Bariatrics

## 2019-06-22 ENCOUNTER — Ambulatory Visit (INDEPENDENT_AMBULATORY_CARE_PROVIDER_SITE_OTHER): Payer: 59 | Admitting: Family Medicine

## 2019-06-22 ENCOUNTER — Other Ambulatory Visit: Payer: Self-pay

## 2019-06-22 ENCOUNTER — Encounter (INDEPENDENT_AMBULATORY_CARE_PROVIDER_SITE_OTHER): Payer: Self-pay | Admitting: Family Medicine

## 2019-06-22 VITALS — BP 126/80 | HR 62 | Temp 98.1°F | Ht 63.0 in | Wt 203.0 lb

## 2019-06-22 DIAGNOSIS — M1711 Unilateral primary osteoarthritis, right knee: Secondary | ICD-10-CM | POA: Diagnosis not present

## 2019-06-22 DIAGNOSIS — Z9189 Other specified personal risk factors, not elsewhere classified: Secondary | ICD-10-CM

## 2019-06-22 DIAGNOSIS — R0602 Shortness of breath: Secondary | ICD-10-CM

## 2019-06-22 DIAGNOSIS — E7849 Other hyperlipidemia: Secondary | ICD-10-CM

## 2019-06-22 DIAGNOSIS — F325 Major depressive disorder, single episode, in full remission: Secondary | ICD-10-CM | POA: Diagnosis not present

## 2019-06-22 DIAGNOSIS — Z0289 Encounter for other administrative examinations: Secondary | ICD-10-CM

## 2019-06-22 DIAGNOSIS — I10 Essential (primary) hypertension: Secondary | ICD-10-CM

## 2019-06-22 DIAGNOSIS — G4719 Other hypersomnia: Secondary | ICD-10-CM | POA: Diagnosis not present

## 2019-06-22 DIAGNOSIS — Z6836 Body mass index (BMI) 36.0-36.9, adult: Secondary | ICD-10-CM

## 2019-06-22 DIAGNOSIS — R5383 Other fatigue: Secondary | ICD-10-CM

## 2019-06-22 DIAGNOSIS — F3289 Other specified depressive episodes: Secondary | ICD-10-CM | POA: Diagnosis not present

## 2019-06-22 NOTE — Progress Notes (Signed)
Dear Dr. Roda Shutters,   Thank you for referring Alejandra Crane to our clinic. The following note includes my evaluation and treatment recommendations.  Chief Complaint:   OBESITY Alejandra Crane (MR# [id number]) is a 69 y.o. female who presents for evaluation and treatment of obesity and related comorbidities. Current BMI is Body mass index is 35.96 kg/m. Alejandra Crane has been struggling with her weight for many years and has been unsuccessful in either losing weight, maintaining weight loss, or reaching her healthy weight goal.  Alejandra Crane is currently in the action stage of change and ready to dedicate time achieving and maintaining a healthier weight. Alejandra Crane is interested in becoming our patient and working on intensive lifestyle modifications including (but not limited to) diet and exercise for weight loss.  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  She says she has gained weight since moving here in 2017.  Alejandra Crane's habits were reviewed today and are as follows: she struggles with family and or coworkers weight loss sabotage, her desired weight loss is 50 pounds, she started gaining weight after childbirth, her heaviest weight ever was 230 pounds, she craves fish, seafood, sweet potatoes, sweets, cookies, pies, cakes, and ice cream, she skips meals sometimes, she is frequently drinking liquids with calories, she frequently makes poor food choices, she frequently eats larger portions than normal and she struggles with emotional eating.  Depression Screen Alejandra Crane's Food and Mood (modified PHQ-9) score was 19.  Depression screen PHQ 2/9 06/22/2019  Decreased Interest 3  Down, Depressed, Hopeless 3  PHQ - 2 Score 6  Altered sleeping 2  Tired, decreased energy 3  Change in appetite 3  Feeling bad or failure about yourself  3  Trouble concentrating 2  Moving slowly or fidgety/restless 0  Suicidal thoughts 0  PHQ-9 Score 19  Difficult doing work/chores Somewhat  difficult   Subjective:   1. Other fatigue Alejandra Crane admits to daytime somnolence and reports waking up still tired. Patent has a history of symptoms of morning fatigue. Alejandra Crane generally gets 8 hours of sleep per night, and states that she has generally restful sleep. Snoring is present. Apneic episodes are not present. Epworth Sleepiness Score is 10.  2. SOB (shortness of breath) on exertion Alejandra Crane notes increasing shortness of breath with exercising and seems to be worsening over time with weight gain. She notes getting out of breath sooner with activity than she used to. This has gotten worse recently. Alejandra Crane denies shortness of breath at rest or orthopnea.  3. Essential hypertension Review: taking medications as instructed, no medication side effects noted, no chest pain on exertion, no dyspnea on exertion, no swelling of ankles.  She takes HCTZ.  BP Readings from Last 3 Encounters:  06/22/19 126/80  03/29/19 112/70  03/31/18 119/76   4. Other hyperlipidemia Alejandra Crane has hyperlipidemia and has been trying to improve her cholesterol levels with intensive lifestyle modification including a low saturated fat diet, exercise and weight loss. She denies any chest pain, claudication or myalgias.  She is taking Crestor.  Lab Results  Component Value Date   ALT 11 03/15/2019   AST 18 03/15/2019   ALKPHOS 81 12/25/2015   BILITOT 0.3 03/15/2019   Lab Results  Component Value Date   CHOL 197 03/15/2019   HDL 69 03/15/2019   LDLCALC 113 (H) 03/15/2019   TRIG 68 03/15/2019   CHOLHDL 2.9 03/15/2019   5. Osteoarthritis of right knee, unspecified osteoarthritis type Alejandra Crane uses [facility] for  her knee pain.  She has a history of a GI bleed with Mobic.  6. Excessive daytime sleepiness Situation Chance of Dozing or Sleeping  Sitting and reading 2 = moderate chance of dozing or sleeping  Watching television 2 = moderate chance of dozing or sleeping  Sitting inactive in a public place (theater or  meeting) 1 = slight chance of dozing or sleeping  Lying down in the afternoon when circumstances permit 3 = high chance of dozing or sleeping  Sitting and talking to someone 0 = would never doze or sleep  Sitting quietly after lunch without alcohol 1 = slight chance of dozing or sleeping  Sitting as a passenger in a car for an hour 1 = slight chance of dozing or sleeping  In a car, while stopped for a few minutes in traffic 0 = would never doze or sleep  TOTAL 10   7. Other depression, with emotional eating Monda is struggling with emotional eating and using food for comfort to the extent that it is negatively impacting her health. She has been working on behavior modification techniques to help reduce her emotional eating and has been unsuccessful. She shows no sign of suicidal or homicidal ideations.  Currently taking Zoloft 100 mg daily.  Lavergne eats when she is stressed, as a reward, to stay awake, when bored, and for comfort.  She overeats at times.  PHQ is 19 today.  8. At risk for heart disease [name] is at a higher than average risk for cardiovascular disease due to obesity. Reviewed: no chest pain on exertion, no dyspnea on exertion, and no swelling of ankles.  Assessment/Plan:   1. Other fatigue Alejandra Crane does feel that her weight is causing her energy to be lower than it should be. Fatigue may be related to obesity, depression or many other causes. Labs will be ordered, and in the meanwhile, Alejandra Crane will focus on self care including making healthy food choices, increasing physical activity and focusing on stress reduction.  Orders - EKG 12-Lead - VITAMIN D 25 Hydroxy (Vit-D Deficiency, Fractures) - T4, free - T3 - TSH - Anemia panel  2. SOB (shortness of breath) on exertion Alejandra Crane does feel that she gets out of breath more easily that she used to when she exercises. Alejandra Crane's shortness of breath appears to be obesity related and exercise induced. She has agreed to work on weight  loss and gradually increase exercise to treat her exercise induced shortness of breath. Will continue to monitor closely.  Orders - CBC with Differential/Platelet  3. Essential hypertension Alejandra Crane is working on healthy weight loss and exercise to improve blood pressure control. We will watch for signs of hypotension as she continues her lifestyle modifications.  Orders - Comprehensive metabolic panel  4. Other hyperlipidemia Cardiovascular risk and specific lipid/LDL goals reviewed.  We discussed several lifestyle modifications today and Debria will continue to work on diet, exercise and weight loss efforts. Orders and follow up as documented in patient record.   Counseling Intensive lifestyle modifications are the first line treatment for this issue. . Dietary changes: Increase soluble fiber. Decrease simple carbohydrates. . Exercise changes: Moderate to vigorous-intensity aerobic activity 150 minutes per week if tolerated. . Lipid-lowering medications: see documented in medical record.  Orders - Hemoglobin A1c - Insulin, random - Lipid panel  5. Osteoarthritis of right knee, unspecified osteoarthritis type Will follow because mobility and pain control are important for weight management.  6. Excessive daytime sleepiness Will monitor.  7. Other depression,  with emotional eating Behavior modification techniques were discussed today to help Kinzley deal with her emotional/non-hunger eating behaviors.  Orders and follow up as documented in patient record.   8. At risk for heart disease Anntionette was given approximately 15 minutes of coronary artery disease prevention counseling today. She is 69 y.o. female and has risk factors for heart disease including obesity. We discussed intensive lifestyle modifications today with an emphasis on specific weight loss instructions and strategies.   Repetitive spaced learning was employed today to elicit superior memory formation and behavioral  change.  9. Class 2 severe obesity with serious comorbidity and body mass index (BMI) of 36.0 to 36.9 in adult, unspecified obesity type (HCC) Su is currently in the action stage of change and her goal is to continue with weight loss efforts. I recommend Corianne begin the structured treatment plan as follows:  She has agreed to the Category 1 Plan.  Exercise goals: Physical Therapy   Behavioral modification strategies: increasing lean protein intake, decreasing simple carbohydrates, increasing vegetables, increasing water intake and decreasing liquid calories.  She was informed of the importance of frequent follow-up visits to maximize her success with intensive lifestyle modifications for her multiple health conditions. She was informed we would discuss her lab results at her next visit unless there is a critical issue that needs to be addressed sooner. Laniyah agreed to keep her next visit at the agreed upon time to discuss these results.  Objective:   Blood pressure 126/80, pulse 62, temperature 98.1 F (36.7 C), temperature source Oral, height 5\' 3"  (1.6 m), weight 203 lb (92.1 kg), SpO2 98 %. Body mass index is 35.96 kg/m.  EKG: Normal sinus rhythm, rate 59 bpm.  Indirect Calorimeter completed today shows a VO2 of 205 and a REE of 1428.  Her calculated basal metabolic rate is 2263 thus her basal metabolic rate is worse than expected.  General: Cooperative, alert, well developed, in no acute distress. HEENT: Conjunctivae and lids unremarkable. Cardiovascular: Regular rhythm.  Lungs: Normal work of breathing. Neurologic: No focal deficits.   Lab Results  Component Value Date   CREATININE 0.86 03/15/2019   BUN 21 03/15/2019   NA 141 03/15/2019   K 3.8 03/15/2019   CL 104 03/15/2019   CO2 28 03/15/2019   Lab Results  Component Value Date   ALT 11 03/15/2019   AST 18 03/15/2019   ALKPHOS 81 12/25/2015   BILITOT 0.3 03/15/2019   Lab Results  Component Value Date    HGBA1C 5.6 03/15/2019   HGBA1C 5.6 03/16/2018   HGBA1C 5.6 09/19/2017   HGBA1C 5.6 12/25/2015   No results found for: INSULIN Lab Results  Component Value Date   TSH 1.47 03/23/2018   Lab Results  Component Value Date   CHOL 197 03/15/2019   HDL 69 03/15/2019   LDLCALC 113 (H) 03/15/2019   TRIG 68 03/15/2019   CHOLHDL 2.9 03/15/2019   Lab Results  Component Value Date   WBC 7.7 03/15/2019   HGB 12.1 03/15/2019   HCT 37.3 03/15/2019   MCV 95.9 03/15/2019   PLT 276 03/15/2019   Attestation Statements:   This is the patient's first visit at [facility]. The patient's NEW PATIENT PACKET was reviewed at length. Included in the packet: current and past health history, medications, allergies, ROS, gynecologic history (women only), surgical history, family history, social history, weight history, weight loss surgery history (for those that have had weight loss surgery), nutritional evaluation, mood and food  questionnaire, PHQ9, Epworth questionnaire, sleep habits questionnaire, patient life and health improvement goals questionnaire. These will all be scanned into the patient's chart under media.   During the visit, I independently reviewed the patient's EKG, bioimpedance scale results, and indirect calorimeter results. I used this information to tailor a meal plan for the patient that will help her to lose weight and will improve her obesity-related conditions going forward. I performed a medically necessary appropriate examination and/or evaluation. I discussed the assessment and treatment plan with the patient. The patient was provided an opportunity to ask questions and all were answered. The patient agreed with the plan and demonstrated an understanding of the instructions. Labs were ordered at this visit and will be reviewed at the next visit unless more critical results need to be addressed immediately. I communicated with the referring physician. Clinical information was  updated and documented in the EMR.   Time spent on visit including pre-visit chart review and post-visit care was 60 minutes.   A separate 15 minutes was spent on risk counseling (see above).   I, Insurance claims handler, CMA, am acting as Energy manager for [employer], DO.  I have reviewed the above documentation for accuracy and completeness, and I agree with the above. Helane Rima, DO

## 2019-06-23 LAB — VITAMIN D 25 HYDROXY (VIT D DEFICIENCY, FRACTURES): Vit D, 25-Hydroxy: 25.4 ng/mL — ABNORMAL LOW (ref 30.0–100.0)

## 2019-06-23 LAB — LIPID PANEL
Chol/HDL Ratio: 2.8 ratio (ref 0.0–4.4)
Cholesterol, Total: 215 mg/dL — ABNORMAL HIGH (ref 100–199)
HDL: 77 mg/dL (ref >39)
LDL Chol Calc (NIH): 123 mg/dL — ABNORMAL HIGH (ref 0–99)
Triglycerides: 86 mg/dL (ref 0–149)
VLDL Cholesterol Cal: 15 mg/dL (ref 5–40)

## 2019-06-23 LAB — COMPREHENSIVE METABOLIC PANEL
ALT: 10 IU/L (ref 0–32)
AST: 21 IU/L (ref 0–40)
Albumin/Globulin Ratio: 1.4 (ref 1.2–2.2)
Albumin: 4.3 g/dL (ref 3.8–4.8)
Alkaline Phosphatase: 92 IU/L (ref 39–117)
BUN/Creatinine Ratio: 28 (ref 12–28)
BUN: 23 mg/dL (ref 8–27)
Bilirubin Total: <0.2 mg/dL (ref 0.0–1.2)
CO2: 24 mmol/L (ref 20–29)
Calcium: 9.4 mg/dL (ref 8.7–10.3)
Chloride: 102 mmol/L (ref 96–106)
Creatinine, Ser: 0.83 mg/dL (ref 0.57–1.00)
GFR calc Af Amer: 84 mL/min/{1.73_m2} (ref >59)
GFR calc non Af Amer: 73 mL/min/{1.73_m2} (ref >59)
Globulin, Total: 3.1 g/dL (ref 1.5–4.5)
Glucose: 88 mg/dL (ref 65–99)
Potassium: 3.8 mmol/L (ref 3.5–5.2)
Sodium: 140 mmol/L (ref 134–144)
Total Protein: 7.4 g/dL (ref 6.0–8.5)

## 2019-06-23 LAB — CBC WITH DIFFERENTIAL/PLATELET
Basophils Absolute: 0.1 10*3/uL (ref 0.0–0.2)
Basos: 1 %
EOS (ABSOLUTE): 0.2 10*3/uL (ref 0.0–0.4)
Eos: 2 %
Hemoglobin: 12.7 g/dL (ref 11.1–15.9)
Immature Grans (Abs): 0 10*3/uL (ref 0.0–0.1)
Immature Granulocytes: 0 %
Lymphocytes Absolute: 4 10*3/uL — ABNORMAL HIGH (ref 0.7–3.1)
Lymphs: 49 %
MCH: 31.5 pg (ref 26.6–33.0)
MCHC: 33.4 g/dL (ref 31.5–35.7)
MCV: 94 fL (ref 79–97)
Monocytes Absolute: 0.6 10*3/uL (ref 0.1–0.9)
Monocytes: 8 %
Neutrophils Absolute: 3.3 10*3/uL (ref 1.4–7.0)
Neutrophils: 40 %
Platelets: 266 10*3/uL (ref 150–450)
RBC: 4.03 x10E6/uL (ref 3.77–5.28)
RDW: 13.3 % (ref 11.7–15.4)
WBC: 8.2 10*3/uL (ref 3.4–10.8)

## 2019-06-23 LAB — ANEMIA PANEL
Ferritin: 77 ng/mL (ref 15–150)
Folate, Hemolysate: 248 ng/mL
Folate, RBC: 653 ng/mL (ref >498)
Hematocrit: 38 % (ref 34.0–46.6)
Iron Saturation: 21 % (ref 15–55)
Iron: 58 ug/dL (ref 27–139)
Retic Ct Pct: 1.4 % (ref 0.6–2.6)
Total Iron Binding Capacity: 277 ug/dL (ref 250–450)
UIBC: 219 ug/dL (ref 118–369)
Vitamin B-12: 669 pg/mL (ref 232–1245)

## 2019-06-23 LAB — T4, FREE: Free T4: 1.04 ng/dL (ref 0.82–1.77)

## 2019-06-23 LAB — HEMOGLOBIN A1C
Est. average glucose Bld gHb Est-mCnc: 120 mg/dL
Hgb A1c MFr Bld: 5.8 % — ABNORMAL HIGH (ref 4.8–5.6)

## 2019-06-23 LAB — INSULIN, RANDOM: INSULIN: 7.1 u[IU]/mL (ref 2.6–24.9)

## 2019-06-23 LAB — TSH: TSH: 2.91 u[IU]/mL (ref 0.450–4.500)

## 2019-06-23 LAB — T3: T3, Total: 155 ng/dL (ref 71–180)

## 2019-07-06 ENCOUNTER — Ambulatory Visit (INDEPENDENT_AMBULATORY_CARE_PROVIDER_SITE_OTHER): Payer: 59 | Admitting: Family Medicine

## 2019-07-06 ENCOUNTER — Other Ambulatory Visit: Payer: Self-pay

## 2019-07-06 ENCOUNTER — Encounter (INDEPENDENT_AMBULATORY_CARE_PROVIDER_SITE_OTHER): Payer: Self-pay | Admitting: Family Medicine

## 2019-07-06 VITALS — BP 100/68 | HR 74 | Temp 98.2°F | Ht 63.0 in | Wt 195.0 lb

## 2019-07-06 DIAGNOSIS — E559 Vitamin D deficiency, unspecified: Secondary | ICD-10-CM

## 2019-07-06 DIAGNOSIS — Z6834 Body mass index (BMI) 34.0-34.9, adult: Secondary | ICD-10-CM

## 2019-07-06 DIAGNOSIS — E7849 Other hyperlipidemia: Secondary | ICD-10-CM

## 2019-07-06 DIAGNOSIS — I10 Essential (primary) hypertension: Secondary | ICD-10-CM | POA: Diagnosis not present

## 2019-07-06 DIAGNOSIS — F3289 Other specified depressive episodes: Secondary | ICD-10-CM

## 2019-07-06 DIAGNOSIS — E669 Obesity, unspecified: Secondary | ICD-10-CM

## 2019-07-06 DIAGNOSIS — Z9189 Other specified personal risk factors, not elsewhere classified: Secondary | ICD-10-CM

## 2019-07-06 DIAGNOSIS — R7303 Prediabetes: Secondary | ICD-10-CM | POA: Diagnosis not present

## 2019-07-06 NOTE — Progress Notes (Signed)
Chief Complaint:   OBESITY Alejandra Crane is here to discuss her progress with her obesity treatment plan along with follow-up of her obesity related diagnoses. Alejandra Crane is on the Category 1 Plan and keeping a food journal and adhering to recommended goals of 1400 calories and 85 grams of protein and states she is following her eating plan approximately 80% of the time. Alejandra Crane states she is walking for 30-45 minutes 1-2 times per week.  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  She logs her calories on her MFP.  She generally gets 1100-1300.  Subjective:   1. Prediabetes Alejandra Crane has a diagnosis of prediabetes based on her elevated HgA1c and was informed this puts her at greater risk of developing diabetes. She continues to work on diet and exercise to decrease her risk of diabetes. She denies nausea or hypoglycemia.  Lab Results  Component Value Date   HGBA1C 5.8 (H) 06/22/2019   Lab Results  Component Value Date   INSULIN 7.1 06/22/2019   2. Essential hypertension Review: taking medications as instructed, no medication side effects noted, no chest pain on exertion, no dyspnea on exertion, no swelling of ankles.  She is taking HCTZ.  BP Readings from Last 3 Encounters:  07/06/19 100/68  06/22/19 126/80  03/29/19 112/70   3. Other hyperlipidemia Alejandra Crane has hyperlipidemia and has been trying to improve her cholesterol levels with intensive lifestyle modification including a low saturated fat diet, exercise and weight loss. She denies any chest pain, claudication or myalgias.  She takes Crestor.  Lab Results  Component Value Date   ALT 10 06/22/2019   AST 21 06/22/2019   ALKPHOS 92 06/22/2019   BILITOT <0.2 06/22/2019   Lab Results  Component Value  Date   CHOL 215 (H) 06/22/2019   HDL 77 06/22/2019   LDLCALC 123 (H) 06/22/2019   TRIG 86 06/22/2019   CHOLHDL 2.8 06/22/2019   4. Vitamin D deficiency Alejandra Crane's Vitamin D level was 25.4 on 06/22/2019. She is currently taking vit D. She denies nausea, vomiting or muscle weakness.  5. Other depression, with emotional eating Alejandra Crane is struggling with emotional eating and using food for comfort to the extent that it is negatively impacting her health. She has been working on behavior modification techniques to help reduce her emotional eating and has been unsuccessful. She shows no sign of suicidal or homicidal ideations.  6. At risk for diabetes mellitus Alejandra Crane is at higher than average risk for developing diabetes due to her obesity.   Assessment/Plan:   1. Prediabetes Alejandra Crane will continue to work on weight loss, exercise, and decreasing simple carbohydrates to help decrease the risk of diabetes.   2. Essential hypertension Alejandra Crane is working on healthy weight loss and exercise to improve blood pressure control. We will watch for signs of hypotension as she continues her lifestyle modifications.  3. Other hyperlipidemia Cardiovascular risk and specific lipid/LDL goals reviewed.  We discussed several lifestyle modifications today and Alejandra Crane will continue to work on diet, exercise and weight loss efforts. Orders and follow up as documented in patient record.   Counseling Intensive lifestyle modifications are the first line treatment for this issue. . Dietary changes: Increase soluble fiber. Decrease simple carbohydrates. . Exercise changes: Moderate to vigorous-intensity  aerobic activity 150 minutes per week if tolerated. . Lipid-lowering medications: see documented in medical record.  4. Vitamin D deficiency Low Vitamin D level contributes to fatigue and are associated with obesity, breast, and colon cancer. She agrees to continue to take prescription Vitamin D @50 ,000 IU every week and  will follow-up for routine testing of Vitamin D, at least 2-3 times per year to avoid over-replacement.  5. Other depression, with emotional eating Behavior modification techniques were discussed today to help Alejandra Crane deal with her emotional/non-hunger eating behaviors.  Orders and follow up as documented in patient record.   6. At risk for diabetes mellitus Alejandra Crane was given approximately 15 minutes of diabetes education and counseling today. We discussed intensive lifestyle modifications today with an emphasis on weight loss as well as increasing exercise and decreasing simple carbohydrates in her diet. We also reviewed medication options with an emphasis on risk versus benefit of those discussed.   Repetitive spaced learning was employed today to elicit superior memory formation and behavioral change.  7. Class 1 obesity with serious comorbidity and body mass index (BMI) of 34.0 to 34.9 in adult, unspecified obesity type Alejandra Crane is currently in the action stage of change. As such, her goal is to continue with weight loss efforts. She has agreed to keeping a food journal and adhering to recommended goals of 1000-1200 calories and 85 grams of protein.   Exercise goals: For substantial health benefits, adults should do at least 150 minutes (2 hours and 30 minutes) a week of moderate-intensity, or 75 minutes (1 hour and 15 minutes) a week of vigorous-intensity aerobic physical activity, or an equivalent combination of moderate- and vigorous-intensity aerobic activity. Aerobic activity should be performed in episodes of at least 10 minutes, and preferably, it should be spread throughout the week.  Behavioral modification strategies: increasing lean protein intake, decreasing simple carbohydrates, increasing vegetables, increasing water intake and decreasing liquid calories.  Alejandra Crane has agreed to follow-up with our clinic in 2 weeks. She was informed of the importance of frequent follow-up visits to  maximize her success with intensive lifestyle modifications for her multiple health conditions.   Objective:   Blood pressure 100/68, pulse 74, temperature 98.2 F (36.8 C), temperature source Oral, height 5\' 3"  (1.6 m), weight 195 lb (88.5 kg), SpO2 97 %. Body mass index is 34.54 kg/m.  General: Cooperative, alert, well developed, in no acute distress. HEENT: Conjunctivae and lids unremarkable. Cardiovascular: Regular rhythm.  Lungs: Normal work of breathing. Neurologic: No focal deficits.   Lab Results  Component Value Date   CREATININE 0.83 06/22/2019   BUN 23 06/22/2019   NA 140 06/22/2019   K 3.8 06/22/2019   CL 102 06/22/2019   CO2 24 06/22/2019   Lab Results  Component Value Date   ALT 10 06/22/2019   AST 21 06/22/2019   ALKPHOS 92 06/22/2019   BILITOT <0.2 06/22/2019   Lab Results  Component Value Date   HGBA1C 5.8 (H) 06/22/2019   HGBA1C 5.6 03/15/2019   HGBA1C 5.6 03/16/2018   HGBA1C 5.6 09/19/2017   HGBA1C 5.6 12/25/2015   Lab Results  Component Value Date   INSULIN 7.1 06/22/2019   Lab Results  Component Value Date   TSH 2.910 06/22/2019   Lab Results  Component Value Date   CHOL 215 (H) 06/22/2019   HDL 77 06/22/2019   LDLCALC 123 (H) 06/22/2019   TRIG 86 06/22/2019   CHOLHDL 2.8 06/22/2019   Lab Results  Component Value Date  WBC 8.2 06/22/2019   HGB 12.7 06/22/2019   HCT 38.0 06/22/2019   MCV 94 06/22/2019   PLT 266 06/22/2019   Lab Results  Component Value Date   IRON 58 06/22/2019   TIBC 277 06/22/2019   FERRITIN 77 06/22/2019   Attestation Statements:   Reviewed by clinician on day of visit: allergies, medications, problem list, medical history, surgical history, family history, social history, and previous encounter notes.  I, Water quality scientist, CMA, am acting as Location manager for [employer], DO.  I have reviewed the above documentation for accuracy and completeness, and I agree with the above. Briscoe Deutscher, DO

## 2019-07-09 ENCOUNTER — Other Ambulatory Visit (INDEPENDENT_AMBULATORY_CARE_PROVIDER_SITE_OTHER): Payer: Self-pay | Admitting: Family Medicine

## 2019-07-09 ENCOUNTER — Other Ambulatory Visit: Payer: 59 | Admitting: Orthotics

## 2019-07-09 ENCOUNTER — Other Ambulatory Visit: Payer: Self-pay

## 2019-07-22 ENCOUNTER — Encounter (INDEPENDENT_AMBULATORY_CARE_PROVIDER_SITE_OTHER): Payer: Self-pay | Admitting: Physician Assistant

## 2019-07-22 ENCOUNTER — Other Ambulatory Visit: Payer: Self-pay

## 2019-07-22 ENCOUNTER — Ambulatory Visit (INDEPENDENT_AMBULATORY_CARE_PROVIDER_SITE_OTHER): Payer: 59 | Admitting: Physician Assistant

## 2019-07-22 VITALS — BP 116/66 | HR 64 | Temp 98.2°F | Ht 63.0 in | Wt 194.0 lb

## 2019-07-22 DIAGNOSIS — E7849 Other hyperlipidemia: Secondary | ICD-10-CM

## 2019-07-22 DIAGNOSIS — Z6834 Body mass index (BMI) 34.0-34.9, adult: Secondary | ICD-10-CM | POA: Diagnosis not present

## 2019-07-22 DIAGNOSIS — E669 Obesity, unspecified: Secondary | ICD-10-CM

## 2019-07-22 NOTE — Progress Notes (Signed)
Chief Complaint:   OBESITY Alejandra Crane is here to discuss her progress with her obesity treatment plan along with follow-up of her obesity related diagnoses. [name] is keeping a food journal and adhering to recommended goals of 1000-1200 calories and 85 grams of protein and states she is following her eating plan approximately 70% of the time. [name] states she is exercising 0 minutes 0 times per week.  Today's visit was #: 3 Starting weight: 203 lbs Starting date: 06/22/2019 Today's weight: 194 lbs Today's date: 07/22/2019 Total lbs lost to date: 9 Total lbs lost since last in-office visit: 1  Interim History: [name] reports that she was more lenient with her calories the last few weeks and did not track her protein. She is looking for more variation in her plan.  Subjective:   Other hyperlipidemia. [name] is on Crestor. No chest pain or myalgias. She is not exercising.  Lab Results  Component Value Date   CHOL 215 (H) 06/22/2019   HDL 77 06/22/2019   LDLCALC 123 (H) 06/22/2019   TRIG 86 06/22/2019   CHOLHDL 2.8 06/22/2019   Lab Results  Component Value Date   ALT 10 06/22/2019   AST 21 06/22/2019   ALKPHOS 92 06/22/2019   BILITOT <0.2 06/22/2019   The 10-year ASCVD risk score Mikey Bussing DC Jr., et al., 2013) is: 8.5%   Values used to calculate the score:     Age: 22 years     Sex: Female     Is Non-Hispanic African American: Yes     Diabetic: No     Tobacco smoker: No     Systolic Blood Pressure: 595 mmHg     Is BP treated: Yes     HDL Cholesterol: 77 mg/dL     Total Cholesterol: 215 mg/dL  Assessment/Plan:   Other hyperlipidemia. Cardiovascular risk and specific lipid/LDL goals reviewed.  We discussed several lifestyle modifications today and [name] will continue to work on diet, exercise and weight loss efforts. Orders and follow up as documented in patient record. [name] will continue her medication as directed.  Counseling Intensive lifestyle  modifications are the first line treatment for this issue. . Dietary changes: Increase soluble fiber. Decrease simple carbohydrates. . Exercise changes: Moderate to vigorous-intensity aerobic activity 150 minutes per week if tolerated. . Lipid-lowering medications: see documented in medical record.  Class 1 obesity with serious comorbidity and body mass index (BMI) of 34.0 to 34.9 in adult, unspecified obesity type.  [name] is currently in the action stage of change. As such, her goal is to continue with weight loss efforts. She has agreed to keeping a food journal and adhering to recommended goals of 1200-1300 calories and 80 grams of protein daily.   Exercise goals: [name] will do 1 day of cardio and 2 days of resistance training.  Behavioral modification strategies: meal planning and cooking strategies and keeping healthy foods in the home.  [name] has agreed to follow-up with our clinic in 2-3 weeks. She was informed of the importance of frequent follow-up visits to maximize her success with intensive lifestyle modifications for her multiple health conditions.   Objective:   Blood pressure 116/66, pulse 64, temperature 98.2 F (36.8 C), temperature source Oral, height 5\' 3"  (1.6 m), weight 194 lb (88 kg), SpO2 99 %. Body mass index is 34.37 kg/m.  General: Cooperative, alert, well developed, in no acute distress. HEENT: Conjunctivae and lids unremarkable. Cardiovascular: Regular rhythm.  Lungs: Normal work of breathing. Neurologic: No focal  deficits.   Lab Results  Component Value Date   CREATININE 0.83 06/22/2019   BUN 23 06/22/2019   NA 140 06/22/2019   K 3.8 06/22/2019   CL 102 06/22/2019   CO2 24 06/22/2019   Lab Results  Component Value Date   ALT 10 06/22/2019   AST 21 06/22/2019   ALKPHOS 92 06/22/2019   BILITOT <0.2 06/22/2019   Lab Results  Component Value Date   HGBA1C 5.8 (H) 06/22/2019   HGBA1C 5.6 03/15/2019   HGBA1C 5.6 03/16/2018   HGBA1C 5.6  09/19/2017   HGBA1C 5.6 12/25/2015   Lab Results  Component Value Date   INSULIN 7.1 06/22/2019   Lab Results  Component Value Date   TSH 2.910 06/22/2019   Lab Results  Component Value Date   CHOL 215 (H) 06/22/2019   HDL 77 06/22/2019   LDLCALC 123 (H) 06/22/2019   TRIG 86 06/22/2019   CHOLHDL 2.8 06/22/2019   Lab Results  Component Value Date   WBC 8.2 06/22/2019   HGB 12.7 06/22/2019   HCT 38.0 06/22/2019   MCV 94 06/22/2019   PLT 266 06/22/2019   Lab Results  Component Value Date   IRON 58 06/22/2019   TIBC 277 06/22/2019   FERRITIN 77 06/22/2019   Attestation Statements:   Reviewed by clinician on day of visit: allergies, medications, problem list, medical history, surgical history, family history, social history, and previous encounter notes.  Time spent on visit including pre-visit chart review and post-visit charting and care was 32 minutes.   IMarianna Payment, am acting as transcriptionist for Alois Cliche, PA-C   I have reviewed the above documentation for accuracy and completeness, and I agree with the above. Alois Cliche, PA-C

## 2019-08-11 ENCOUNTER — Other Ambulatory Visit: Payer: Self-pay | Admitting: *Deleted

## 2019-08-11 DIAGNOSIS — E782 Mixed hyperlipidemia: Secondary | ICD-10-CM

## 2019-08-11 MED ORDER — ROSUVASTATIN CALCIUM 5 MG PO TABS: 5.0000 mg | ORAL_TABLET | Freq: Every day | ORAL | 1 refills | Status: DC

## 2019-08-11 MED FILL — ROSUVASTATIN CALCIUM 5 MG T: 5 | 90 days supply | Qty: 90 | Fill #0

## 2019-08-11 NOTE — Telephone Encounter (Signed)
[facility] 

## 2019-08-12 ENCOUNTER — Ambulatory Visit (INDEPENDENT_AMBULATORY_CARE_PROVIDER_SITE_OTHER): Payer: 59 | Admitting: Physician Assistant

## 2019-08-12 ENCOUNTER — Encounter (INDEPENDENT_AMBULATORY_CARE_PROVIDER_SITE_OTHER): Payer: Self-pay | Admitting: Physician Assistant

## 2019-08-12 ENCOUNTER — Other Ambulatory Visit: Payer: Self-pay

## 2019-08-12 VITALS — BP 106/66 | HR 65 | Temp 98.2°F | Ht 63.0 in | Wt 189.0 lb

## 2019-08-12 DIAGNOSIS — E559 Vitamin D deficiency, unspecified: Secondary | ICD-10-CM

## 2019-08-12 DIAGNOSIS — R7303 Prediabetes: Secondary | ICD-10-CM

## 2019-08-12 DIAGNOSIS — E669 Obesity, unspecified: Secondary | ICD-10-CM

## 2019-08-12 DIAGNOSIS — Z6833 Body mass index (BMI) 33.0-33.9, adult: Secondary | ICD-10-CM

## 2019-08-12 DIAGNOSIS — Z9189 Other specified personal risk factors, not elsewhere classified: Secondary | ICD-10-CM | POA: Diagnosis not present

## 2019-08-12 MED ORDER — VITAMIN D (ERGOCALCIFEROL) 1.25 MG (50000 UNIT) PO CAPS: 50000.0000 [IU] | ORAL_CAPSULE | ORAL | 0 refills | Status: DC

## 2019-08-12 MED FILL — VIT D2 1.25 MG (50,000 UNIT: 1.25 MG | 28 days supply | Qty: 4 | Fill #0

## 2019-08-12 NOTE — Progress Notes (Signed)
Chief Complaint:   OBESITY Alejandra Crane is here to discuss her progress with her obesity treatment plan along with follow-up of her obesity related diagnoses. Alejandra Crane is keeping a food journal and adhering to recommended goals of 1200-1300 calories and 80 grams of protein and states she is following her eating plan approximately 75% of the time. Alejandra Crane states she is walking 30 minutes 1 time per week.  Today's visit was #: 4 Starting weight: 203 lbs Starting date: 06/22/2019 Today's weight: 189 lbs Today's date: 08/12/2019 Total lbs lost to date: 14 Total lbs lost since last in-office visit: 5  Interim History: Alejandra Crane did very well with weight loss. She states that she has been skipping meals intermittently and did not journal consistently the last few weeks.  Subjective:   Prediabetes. Alejandra Crane has a diagnosis of prediabetes based on her elevated HgA1c and was informed this puts her at greater risk of developing diabetes. She continues to work on diet and exercise to decrease her risk of diabetes. She denies nausea, vomiting, or diarrhea. Alejandra Crane is on no medications. No polyphagia.  Lab Results  Component Value Date   HGBA1C 5.8 (H) 06/22/2019   Lab Results  Component Value Date   INSULIN 7.1 06/22/2019   Vitamin D deficiency. Alejandra Crane is on OTC Vitamin D with calcium. No nausea, vomiting, or muscle weakness. Last Vitamin D 25.4 on 06/22/2019.  At risk for osteoporosis. Alejandra Crane is at higher risk of osteopenia and osteoporosis due to Vitamin D deficiency.   Assessment/Plan:   Prediabetes. Alejandra Crane will continue to work on weight loss, exercise, and decreasing simple carbohydrates to help decrease the risk of diabetes.   Vitamin D deficiency. Low Vitamin D level contributes to fatigue and are associated with obesity, breast, and colon cancer. She will start Vitamin D, Ergocalciferol, (DRISDOL) 1.25 MG (50000 UNIT) CAPS capsule every week #4 with 0 refills and will follow-up for  routine testing of Vitamin D, at least 2-3 times per year to avoid over-replacement.   At risk for osteoporosis. Alejandra Crane was given approximately 15 minutes of osteoporosis prevention counseling today. Alejandra Crane is at risk for osteopenia and osteoporosis due to her Vitamin D deficiency. She was encouraged to take her Vitamin D and follow her higher calcium diet and increase strengthening exercise to help strengthen her bones and decrease her risk of osteopenia and osteoporosis.  Repetitive spaced learning was employed today to elicit superior memory formation and behavioral change.  Class 1 obesity with serious comorbidity and body mass index (BMI) of 33.0 to 33.9 in adult, unspecified obesity type.  Alejandra Crane is currently in the action stage of change. As such, her goal is to continue with weight loss efforts. She has agreed to keeping a food journal and adhering to recommended goals of 1200-1300 calories and 80 grams of protein daily.   Exercise goals: Older adults should follow the adult guidelines. When older adults cannot meet the adult guidelines, they should be as physically active as their abilities and conditions will allow.   Behavioral modification strategies: meal planning and cooking strategies and keeping healthy foods in the home.  Alejandra Crane has agreed to follow-up with our clinic in 3 weeks. She was informed of the importance of frequent follow-up visits to maximize her success with intensive lifestyle modifications for her multiple health conditions.   Objective:   Blood pressure 106/66, pulse 65, temperature 98.2 F (36.8 C), temperature source Oral, height 5\' 3"  (1.6 m), weight 189 lb (85.7 kg), SpO2 97 %.  Body mass index is 33.48 kg/m.  General: Cooperative, alert, well developed, in no acute distress. HEENT: Conjunctivae and lids unremarkable. Cardiovascular: Regular rhythm.  Lungs: Normal work of breathing. Neurologic: No focal deficits.   Lab Results  Component Value Date    CREATININE 0.83 06/22/2019   BUN 23 06/22/2019   NA 140 06/22/2019   K 3.8 06/22/2019   CL 102 06/22/2019   CO2 24 06/22/2019   Lab Results  Component Value Date   ALT 10 06/22/2019   AST 21 06/22/2019   ALKPHOS 92 06/22/2019   BILITOT <0.2 06/22/2019   Lab Results  Component Value Date   HGBA1C 5.8 (H) 06/22/2019   HGBA1C 5.6 03/15/2019   HGBA1C 5.6 03/16/2018   HGBA1C 5.6 09/19/2017   HGBA1C 5.6 12/25/2015   Lab Results  Component Value Date   INSULIN 7.1 06/22/2019   Lab Results  Component Value Date   TSH 2.910 06/22/2019   Lab Results  Component Value Date   CHOL 215 (H) 06/22/2019   HDL 77 06/22/2019   LDLCALC 123 (H) 06/22/2019   TRIG 86 06/22/2019   CHOLHDL 2.8 06/22/2019   Lab Results  Component Value Date   WBC 8.2 06/22/2019   HGB 12.7 06/22/2019   HCT 38.0 06/22/2019   MCV 94 06/22/2019   PLT 266 06/22/2019   Lab Results  Component Value Date   IRON 58 06/22/2019   TIBC 277 06/22/2019   FERRITIN 77 06/22/2019   Attestation Statements:   Reviewed by clinician on day of visit: allergies, medications, problem list, medical history, surgical history, family history, social history, and previous encounter notes.  IMarianna Payment, am acting as transcriptionist for Alois Cliche, PA-C   I have reviewed the above documentation for accuracy and completeness, and I agree with the above. Alois Cliche, PA-C

## 2019-08-21 ENCOUNTER — Other Ambulatory Visit: Payer: Self-pay

## 2019-08-21 ENCOUNTER — Inpatient Hospital Stay (HOSPITAL_COMMUNITY)
Admission: EM | Admit: 2019-08-21 | Discharge: 2019-08-23 | DRG: 378 | Disposition: A | Discharge status: Home / Self Care | Payer: 59 | Attending: Student | Admitting: Student

## 2019-08-21 ENCOUNTER — Encounter (HOSPITAL_COMMUNITY): Payer: Self-pay | Admitting: Emergency Medicine

## 2019-08-21 DIAGNOSIS — F329 Major depressive disorder, single episode, unspecified: Secondary | ICD-10-CM | POA: Diagnosis not present

## 2019-08-21 DIAGNOSIS — A6000 Herpesviral infection of urogenital system, unspecified: Secondary | ICD-10-CM | POA: Diagnosis present

## 2019-08-21 DIAGNOSIS — K219 Gastro-esophageal reflux disease without esophagitis: Secondary | ICD-10-CM | POA: Diagnosis present

## 2019-08-21 DIAGNOSIS — Z6832 Body mass index (BMI) 32.0-32.9, adult: Secondary | ICD-10-CM

## 2019-08-21 DIAGNOSIS — D62 Acute posthemorrhagic anemia: Secondary | ICD-10-CM | POA: Diagnosis present

## 2019-08-21 DIAGNOSIS — I10 Essential (primary) hypertension: Secondary | ICD-10-CM | POA: Diagnosis present

## 2019-08-21 DIAGNOSIS — K5791 Diverticulosis of intestine, part unspecified, without perforation or abscess with bleeding: Secondary | ICD-10-CM | POA: Diagnosis not present

## 2019-08-21 DIAGNOSIS — Z79899 Other long term (current) drug therapy: Secondary | ICD-10-CM

## 2019-08-21 DIAGNOSIS — I951 Orthostatic hypotension: Secondary | ICD-10-CM | POA: Diagnosis present

## 2019-08-21 DIAGNOSIS — F419 Anxiety disorder, unspecified: Secondary | ICD-10-CM | POA: Diagnosis not present

## 2019-08-21 DIAGNOSIS — K922 Gastrointestinal hemorrhage, unspecified: Secondary | ICD-10-CM | POA: Diagnosis not present

## 2019-08-21 DIAGNOSIS — E869 Volume depletion, unspecified: Secondary | ICD-10-CM | POA: Diagnosis present

## 2019-08-21 DIAGNOSIS — M170 Bilateral primary osteoarthritis of knee: Secondary | ICD-10-CM | POA: Diagnosis present

## 2019-08-21 DIAGNOSIS — R55 Syncope and collapse: Secondary | ICD-10-CM | POA: Diagnosis not present

## 2019-08-21 DIAGNOSIS — R030 Elevated blood-pressure reading, without diagnosis of hypertension: Secondary | ICD-10-CM | POA: Diagnosis not present

## 2019-08-21 DIAGNOSIS — E782 Mixed hyperlipidemia: Secondary | ICD-10-CM | POA: Diagnosis not present

## 2019-08-21 DIAGNOSIS — Z8249 Family history of ischemic heart disease and other diseases of the circulatory system: Secondary | ICD-10-CM

## 2019-08-21 DIAGNOSIS — E669 Obesity, unspecified: Secondary | ICD-10-CM | POA: Diagnosis present

## 2019-08-21 DIAGNOSIS — I341 Nonrheumatic mitral (valve) prolapse: Secondary | ICD-10-CM | POA: Diagnosis present

## 2019-08-21 DIAGNOSIS — Z888 Allergy status to other drugs, medicaments and biological substances status: Secondary | ICD-10-CM

## 2019-08-21 DIAGNOSIS — Z20822 Contact with and (suspected) exposure to covid-19: Secondary | ICD-10-CM | POA: Diagnosis present

## 2019-08-21 DIAGNOSIS — Z87891 Personal history of nicotine dependence: Secondary | ICD-10-CM

## 2019-08-21 NOTE — ED Triage Notes (Signed)
Pt reports three episodes of bright red blood in stool since 7pm tonight.  Hx of past GI bleed.  Also complains of abdominal cramping

## 2019-08-22 ENCOUNTER — Encounter (HOSPITAL_COMMUNITY): Payer: Self-pay | Admitting: Internal Medicine

## 2019-08-22 DIAGNOSIS — I951 Orthostatic hypotension: Secondary | ICD-10-CM | POA: Diagnosis present

## 2019-08-22 DIAGNOSIS — K625 Hemorrhage of anus and rectum: Secondary | ICD-10-CM | POA: Diagnosis not present

## 2019-08-22 DIAGNOSIS — K5730 Diverticulosis of large intestine without perforation or abscess without bleeding: Secondary | ICD-10-CM | POA: Diagnosis not present

## 2019-08-22 DIAGNOSIS — I341 Nonrheumatic mitral (valve) prolapse: Secondary | ICD-10-CM | POA: Diagnosis present

## 2019-08-22 DIAGNOSIS — Z79899 Other long term (current) drug therapy: Secondary | ICD-10-CM | POA: Diagnosis not present

## 2019-08-22 DIAGNOSIS — K219 Gastro-esophageal reflux disease without esophagitis: Secondary | ICD-10-CM | POA: Diagnosis present

## 2019-08-22 DIAGNOSIS — Z888 Allergy status to other drugs, medicaments and biological substances status: Secondary | ICD-10-CM | POA: Diagnosis not present

## 2019-08-22 DIAGNOSIS — E669 Obesity, unspecified: Secondary | ICD-10-CM | POA: Diagnosis not present

## 2019-08-22 DIAGNOSIS — Z8249 Family history of ischemic heart disease and other diseases of the circulatory system: Secondary | ICD-10-CM | POA: Diagnosis not present

## 2019-08-22 DIAGNOSIS — Z6832 Body mass index (BMI) 32.0-32.9, adult: Secondary | ICD-10-CM | POA: Diagnosis not present

## 2019-08-22 DIAGNOSIS — K922 Gastrointestinal hemorrhage, unspecified: Secondary | ICD-10-CM | POA: Diagnosis not present

## 2019-08-22 DIAGNOSIS — Z87891 Personal history of nicotine dependence: Secondary | ICD-10-CM | POA: Diagnosis not present

## 2019-08-22 DIAGNOSIS — E782 Mixed hyperlipidemia: Secondary | ICD-10-CM | POA: Diagnosis not present

## 2019-08-22 DIAGNOSIS — F329 Major depressive disorder, single episode, unspecified: Secondary | ICD-10-CM | POA: Diagnosis present

## 2019-08-22 DIAGNOSIS — D62 Acute posthemorrhagic anemia: Secondary | ICD-10-CM

## 2019-08-22 DIAGNOSIS — K5791 Diverticulosis of intestine, part unspecified, without perforation or abscess with bleeding: Secondary | ICD-10-CM | POA: Diagnosis present

## 2019-08-22 DIAGNOSIS — I10 Essential (primary) hypertension: Secondary | ICD-10-CM | POA: Diagnosis not present

## 2019-08-22 DIAGNOSIS — F419 Anxiety disorder, unspecified: Secondary | ICD-10-CM | POA: Diagnosis present

## 2019-08-22 DIAGNOSIS — D500 Iron deficiency anemia secondary to blood loss (chronic): Secondary | ICD-10-CM | POA: Diagnosis not present

## 2019-08-22 DIAGNOSIS — R030 Elevated blood-pressure reading, without diagnosis of hypertension: Secondary | ICD-10-CM | POA: Diagnosis present

## 2019-08-22 DIAGNOSIS — M170 Bilateral primary osteoarthritis of knee: Secondary | ICD-10-CM | POA: Diagnosis present

## 2019-08-22 DIAGNOSIS — Z20822 Contact with and (suspected) exposure to covid-19: Secondary | ICD-10-CM | POA: Diagnosis present

## 2019-08-22 DIAGNOSIS — E869 Volume depletion, unspecified: Secondary | ICD-10-CM | POA: Diagnosis present

## 2019-08-22 DIAGNOSIS — A6000 Herpesviral infection of urogenital system, unspecified: Secondary | ICD-10-CM | POA: Diagnosis present

## 2019-08-22 LAB — HEMOGLOBIN AND HEMATOCRIT, BLOOD
HCT: 24.6 % — ABNORMAL LOW (ref 36.0–46.0)
HCT: 26.9 % — ABNORMAL LOW (ref 36.0–46.0)
HCT: 26.9 % — ABNORMAL LOW (ref 36.0–46.0)
Hemoglobin: 7.6 g/dL — ABNORMAL LOW (ref 12.0–15.0)
Hemoglobin: 8.3 g/dL — ABNORMAL LOW (ref 12.0–15.0)
Hemoglobin: 8.5 g/dL — ABNORMAL LOW (ref 12.0–15.0)

## 2019-08-22 LAB — LACTIC ACID, PLASMA
Lactic Acid, Venous: 1.3 mmol/L (ref 0.5–1.9)
Lactic Acid, Venous: 1 mmol/L (ref 0.5–1.9)

## 2019-08-22 LAB — CBC
HCT: 30.1 % — ABNORMAL LOW (ref 36.0–46.0)
HCT: 34.2 % — ABNORMAL LOW (ref 36.0–46.0)
Hemoglobin: 10.8 g/dL — ABNORMAL LOW (ref 12.0–15.0)
Hemoglobin: 9.2 g/dL — ABNORMAL LOW (ref 12.0–15.0)
MCH: 30.8 pg (ref 26.0–34.0)
MCH: 31.3 pg (ref 26.0–34.0)
MCHC: 30.6 g/dL (ref 30.0–36.0)
MCHC: 31.6 g/dL (ref 30.0–36.0)
MCV: 100.7 fL — ABNORMAL HIGH (ref 80.0–100.0)
MCV: 99.1 fL (ref 80.0–100.0)
Platelets: 220 10*3/uL (ref 150–400)
Platelets: 263 10*3/uL (ref 150–400)
RBC: 2.99 MIL/uL — ABNORMAL LOW (ref 3.87–5.11)
RBC: 3.45 MIL/uL — ABNORMAL LOW (ref 3.87–5.11)
RDW: 13.9 % (ref 11.5–15.5)
RDW: 14.1 % (ref 11.5–15.5)
WBC: 11.7 10*3/uL — ABNORMAL HIGH (ref 4.0–10.5)
WBC: 12.1 10*3/uL — ABNORMAL HIGH (ref 4.0–10.5)
nRBC: 0 % (ref 0.0–0.2)
nRBC: 0 % (ref 0.0–0.2)

## 2019-08-22 LAB — COMPREHENSIVE METABOLIC PANEL
ALT: 14 U/L (ref 0–44)
AST: 20 U/L (ref 15–41)
Albumin: 3.4 g/dL — ABNORMAL LOW (ref 3.5–5.0)
Alkaline Phosphatase: 61 U/L (ref 38–126)
Anion gap: 8 (ref 5–15)
BUN: 25 mg/dL — ABNORMAL HIGH (ref 8–23)
CO2: 25 mmol/L (ref 22–32)
Calcium: 9 mg/dL (ref 8.9–10.3)
Chloride: 106 mmol/L (ref 98–111)
Creatinine, Ser: 1.07 mg/dL — ABNORMAL HIGH (ref 0.44–1.00)
GFR calc Af Amer: >60 mL/min (ref >60)
GFR calc non Af Amer: 54 mL/min — ABNORMAL LOW (ref >60)
Glucose, Bld: 118 mg/dL — ABNORMAL HIGH (ref 70–99)
Potassium: 3.8 mmol/L (ref 3.5–5.1)
Sodium: 139 mmol/L (ref 135–145)
Total Bilirubin: 0.3 mg/dL (ref 0.3–1.2)
Total Protein: 6.2 g/dL — ABNORMAL LOW (ref 6.5–8.1)

## 2019-08-22 LAB — TYPE AND SCREEN
ABO/RH(D): A POS
Antibody Screen: NEGATIVE

## 2019-08-22 LAB — IRON AND TIBC
Iron: 49 ug/dL (ref 28–170)
Saturation Ratios: 20 % (ref 10.4–31.8)
TIBC: 246 ug/dL — ABNORMAL LOW (ref 250–450)
UIBC: 197 ug/dL

## 2019-08-22 LAB — HIV ANTIBODY (ROUTINE TESTING W REFLEX): HIV Screen 4th Generation wRfx: NONREACTIVE

## 2019-08-22 LAB — RESPIRATORY PANEL BY RT PCR (FLU A&B, COVID)
Influenza A by PCR: NEGATIVE
Influenza B by PCR: NEGATIVE
SARS Coronavirus 2 by RT PCR: NEGATIVE

## 2019-08-22 LAB — ABO/RH: ABO/RH(D): A POS

## 2019-08-22 LAB — PROTIME-INR
INR: 1.1 (ref 0.8–1.2)
Prothrombin Time: 14.5 seconds (ref 11.4–15.2)

## 2019-08-22 LAB — TROPONIN I (HIGH SENSITIVITY): Troponin I (High Sensitivity): 3 ng/L (ref <18)

## 2019-08-22 LAB — APTT: aPTT: 24 seconds (ref 24–36)

## 2019-08-22 MED ORDER — LACTATED RINGERS IV SOLN: INTRAVENOUS | Status: DC

## 2019-08-22 MED ORDER — ONDANSETRON HCL 4 MG PO TABS: 4.0000 mg | ORAL_TABLET | Freq: Four times a day (QID) | ORAL | Status: DC | PRN

## 2019-08-22 MED ORDER — CALCIUM CARBONATE-VITAMIN D 500-200 MG-UNIT PO TABS
1.0000 | ORAL_TABLET | Freq: Every day | ORAL | Status: DC
  Administered 2019-08-22: 1 via ORAL
  Filled 2019-08-22 (×2): qty 1

## 2019-08-22 MED ORDER — ONDANSETRON HCL 4 MG/2ML IJ SOLN: 4.0000 mg | Freq: Four times a day (QID) | INTRAMUSCULAR | Status: DC | PRN

## 2019-08-22 MED ORDER — LACTATED RINGERS IV BOLUS
1000.0000 mL | Freq: Once | INTRAVENOUS | Status: AC
  Administered 2019-08-22: 1000 mL via INTRAVENOUS

## 2019-08-22 MED ORDER — PANTOPRAZOLE SODIUM 40 MG IV SOLR
40.0000 mg | INTRAVENOUS | Status: DC
  Administered 2019-08-22 – 2019-08-23 (×2): 40 mg via INTRAVENOUS
  Filled 2019-08-22 (×2): qty 40

## 2019-08-22 MED ORDER — ROSUVASTATIN CALCIUM 5 MG PO TABS
5.0000 mg | ORAL_TABLET | Freq: Every day | ORAL | Status: DC
  Administered 2019-08-22: 5 mg via ORAL
  Filled 2019-08-22: qty 1

## 2019-08-22 MED ORDER — ACETAMINOPHEN 325 MG PO TABS: 650.0000 mg | ORAL_TABLET | Freq: Four times a day (QID) | ORAL | Status: DC | PRN

## 2019-08-22 MED ORDER — SERTRALINE HCL 100 MG PO TABS
100.0000 mg | ORAL_TABLET | Freq: Every day | ORAL | Status: DC
  Administered 2019-08-22: 100 mg via ORAL
  Filled 2019-08-22 (×2): qty 1

## 2019-08-22 MED ORDER — SODIUM CHLORIDE 0.9 % IV BOLUS
500.0000 mL | Freq: Once | INTRAVENOUS | Status: AC
  Administered 2019-08-22: 500 mL via INTRAVENOUS

## 2019-08-22 MED ORDER — MORPHINE SULFATE (PF) 2 MG/ML IV SOLN: 2.0000 mg | INTRAVENOUS | Status: DC | PRN

## 2019-08-22 MED ORDER — ACETAMINOPHEN 650 MG RE SUPP: 650.0000 mg | Freq: Four times a day (QID) | RECTAL | Status: DC | PRN

## 2019-08-22 NOTE — ED Notes (Signed)
Pt was found on the ground in the waiting room.  Charge was notified and pt was taken to room.

## 2019-08-22 NOTE — H&P (Signed)
History and Physical    Jihan Mellette ZGY:[id number] DOB: [birth year] DOA: 08/21/2019  PCP: [name], NP  Patient coming from: Home   Chief Complaint:  Chief Complaint  Patient presents with  . GI Bleeding     HPI:  69 year old female with past medical history of diverticulosis, hypertension, hyperlipidemia and lower gastrointestinal bleeding in the past thought to be secondary to diverticular bleed [facility] in [place]) who presents to [facility] emergency department with several bouts of large-volume bright red blood per rectum.  Patient explains that yesterday evening at approximately 7 PM she began to experience generalized abdominal pain.  The patient describes the pain as aching in quality, 5 out of 10 in intensity and radiating diffusely.  This was immediately followed by a bowel movement with a large volume of bright red blood.  Upon further questioning patient denies alcohol use, NSAID use or any prescription anticoagulant use.  Patient does complain of nausea but denies any vomiting.  Patient continued to have additional episodes of bright red blood per rectum.  At 8 PM she had a large bowel movement and again at approximately 10 PM she had a third large bowel movement in the home.  This is when the patient presented to [facility] for evaluation.  Upon waiting in the emergency room waiting room rose from a seated position, felt intense lightheadedness and experienced an episode of syncope.  At this point the patient was brought back into the emergency department for further evaluation.  Shortly thereafter the patient had her fourth episode of bright red blood per rectum, large volume.  Upon evaluation in the emergency department patient was provided with saline bolus.  Patient was noted to have   CCa 5 a hemoglobin of 10.8 with mild leukocytosis of 11.7.  Dr. Elnoria Howard with gastroenterology was called and notified of patient's arrival.  The  hospitalist group has now been called to assess the patient for admission to hospital.    Review of Systems: A 10-system review of systems has been performed and all systems are negative with the exception of what is listed in the HPI.   Past Medical History:  Diagnosis Date  . Arthritis   . Depression   . Diverticulosis    noted on colonscopy   . Elevated cholesterol   . GERD (gastroesophageal reflux disease)   . H/O mammogram    2015, 2016  . 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 High blood pressure   . History of esophagogastroduodenoscopy (EGD)    2014, 2016  . History of PFTs 10/26/2012  . Lower GI bleed 01/19/2015   Related to mobic 15 mg   . Mitral valve prolapse 09/06/1996  . Osteoarthritis of both knees     Past Surgical History:  Procedure Laterality Date  . BREAST EXCISIONAL BIOPSY Left    benign  . BREAST SURGERY  05/20/2007   [facility], Dr.William Purkert   . COLONOSCOPY     2014, 2016 Dr.Bajani Manalo, Dr.Mahmood Abedi      reports that she has quit smoking. She has a 2.50 pack-year smoking history. She has never used smokeless tobacco. She reports current alcohol use. She reports that she does not use drugs.  Allergies  Allergen Reactions  . Mobic [Meloxicam]     GI bleed    Family History  Problem Relation Age of Onset  . Heart attack Mother   . CVA Mother   . Diabetes Mother   .  Congestive Heart Failure Mother   . Stroke Mother   . Heart disease Mother   . Hyperlipidemia Mother   . Hypertension Mother   . Depression Mother   . Obesity Mother   . Lung cancer Sister 55  . Cancer Brother        GI or colon cancer  . Neurologic Disorder Son   . Cancer Other        Niece-colon cancer  . Diabetes Other        Nephew     Prior to Admission medications   Medication Sig Start Date End Date Taking? Authorizing Provider  Calcium Carbonate-Vitamin D (CALTRATE 600+D) 600-400 MG-UNIT tablet Take 1 tablet by mouth 2 (two) times daily. Patient taking  differently: Take 1 tablet by mouth at bedtime.  09/19/17  Yes Kirt Boys, DO  Diclofenac Sodium (PENNSAID) 2 % SOLN Apply 2 g topically 2 (two) times daily as needed (to affected area). 04/08/19  Yes Tarry Kos, MD  hydrochlorothiazide (HYDRODIURIL) 25 MG tablet TAKE 1 TABLET BY MOUTH DAILY. Patient taking differently: Take 25 mg by mouth at bedtime.  06/15/19  Yes [name], NP  rosuvastatin (CRESTOR) 5 MG tablet Take 1 tablet (5 mg total) by mouth at bedtime. 08/11/19  Yes [name], NP  sertraline (ZOLOFT) 100 MG tablet TAKE 1 TABLET BY MOUTH DAILY. Patient taking differently: Take 100 mg by mouth at bedtime.  06/15/19  Yes [name], NP  valACYclovir (VALTREX) 500 MG tablet Take 1 tablet (500 mg total) by mouth daily. Patient taking differently: Take 500 mg by mouth at bedtime.  03/23/18  Yes [name], NP  Vitamin D, Ergocalciferol, (DRISDOL) 1.25 MG (50000 UNIT) CAPS capsule Take 1 capsule (50,000 Units total) by mouth every 7 (seven) days. Patient taking differently: Take 50,000 Units by mouth every Friday.  08/12/19  Yes Alois Cliche, PA-C    Physical Exam: Vitals:   08/21/19 2329 08/21/19 2339 08/22/19 0215  BP: (!) 127/92    Pulse: (!) 108  71  Resp: 18  17  Temp: 98.6 F (37 C)    TempSrc: Oral    SpO2: 100%  100%  Weight:  83.9 kg   Height:  5\' 3"  (1.6 m)     Constitutional: Acute alert and oriented x3, no associated distress.   Skin: no rashes, no lesions, good skin turgor noted. Eyes: Pupils are equally reactive to light.  Some conjunctival pallor noted without evidence of scleral icterus. ENMT: Somewhat dry mucous membranes noted.  posterior pharynx clear of any exudate or lesions. Normal dentition.   Neck: normal, supple, no masses, no thyromegaly Respiratory: clear to auscultation bilaterally, no wheezing, no crackles. Normal respiratory effort. No accessory muscle use.  Cardiovascular: Regular rate and rhythm, no murmurs / rubs /  gallops. No extremity edema. 2+ pedal pulses. No carotid bruits.  Back:   Nontender without crepitus or deformity. Abdomen: Abdomen is soft and nontender.  No evidence of intra-abdominal masses.  Positive bowel sounds noted in all quadrants.   Musculoskeletal: No joint deformity upper and lower extremities. Good ROM, no contractures. Normal muscle tone.  Neurologic: CN 2-12 grossly intact. Sensation intact, strength noted to be 5 out of 5 in all 4 extremities.  Patient is following all commands.  Patient is responsive to verbal stimuli.   Psychiatric: Patient presents as a normal mood with appropriate affect.  Patient seems to possess insight as to theircurrent situation.     Labs on  Admission: I have personally reviewed following labs and imaging studies -   CBC: Recent Labs  Lab 08/21/19 2353  WBC 11.7*  HGB 10.8*  HCT 34.2*  MCV 99.1  PLT 073   Basic Metabolic Panel: Recent Labs  Lab 08/21/19 2353  NA 139  K 3.8  CL 106  CO2 25  GLUCOSE 118*  BUN 25*  CREATININE 1.07*  CALCIUM 9.0   GFR: Estimated Creatinine Clearance: 52.4 mL/min (A) (by C-G formula based on SCr of 1.07 mg/dL (H)). Liver Function Tests: Recent Labs  Lab 08/21/19 2353  AST 20  ALT 14  ALKPHOS 61  BILITOT 0.3  PROT 6.2*  ALBUMIN 3.4*   No results for input(s): LIPASE, AMYLASE in the last 168 hours. No results for input(s): AMMONIA in the last 168 hours. Coagulation Profile: No results for input(s): INR, PROTIME in the last 168 hours. Cardiac Enzymes: No results for input(s): CKTOTAL, CKMB, CKMBINDEX, TROPONINI in the last 168 hours. BNP (last 3 results) No results for input(s): PROBNP in the last 8760 hours. HbA1C: No results for input(s): HGBA1C in the last 72 hours. CBG: No results for input(s): GLUCAP in the last 168 hours. Lipid Profile: No results for input(s): CHOL, HDL, LDLCALC, TRIG, CHOLHDL, LDLDIRECT in the last 72 hours. Thyroid Function Tests: No results for input(s): TSH,  T4TOTAL, FREET4, T3FREE, THYROIDAB in the last 72 hours. Anemia Panel: No results for input(s): VITAMINB12, FOLATE, FERRITIN, TIBC, IRON, RETICCTPCT in the last 72 hours. Urine analysis: No results found for: COLORURINE, APPEARANCEUR, LABSPEC, PHURINE, GLUCOSEU, HGBUR, BILIRUBINUR, KETONESUR, PROTEINUR, UROBILINOGEN, NITRITE, LEUKOCYTESUR  Radiological Exams on Admission: No results found.  EKG: Personally reviewed.  Rhythm is normal sinus rhythm with heart rate of 80 bpm.  Some ST segment elevation noted in the anterior septal leads likely secondary to early repolarization  Assessment/Plan Active Problems:   Acute lower GI bleeding   Patient presenting with multiple episodes of large-volume bright red blood per rectum concerning for lower gastrointestinal bleeding  Considering history of severe diverticulosis, diverticular bleed is the most likely possibility  Patient has been made n.p.o.  Hydrating patient with intravenous isotonic fluids  Dr. Benson Norway with GI already been contacted by the emergency department staff.  Patient is not undergoing endoscopic evaluation this evening and gastroenterology will need formal consultation in the morning.  Coagulation profile ordered  Performing CBCs every 4 hours for now  Considering active bleeding will place patient in stepdown unit for now, if clinically improves can downgrade later.  Type and screen already obtained, will transfuse if patient continues to experience large volume bleeding or if hemoglobin drops below the 7-8 range.  Orthostatic Syncope   Patient likely suffered an sewed of orthostatic syncope in the waiting room after arising from a seated position in the setting of relative volume depletion  Hydrating patient with intravenous isotonic fluids  Monitoring patient on telemetry    Essential hypertension   Temporarily holding antihypertensive therapy considering active bleeding    Mixed  hyperlipidemia   Temporarily holding oral statin therapy, will restart when able  GERD    Protonix 40 mg IV every 24 hours   Code Status:  Full code Family Communication: Deferred  Status is: Observation  The patient remains OBS appropriate and will d/c before 2 midnights.  Dispo: The patient is from: Home              Anticipated d/c is to: Home  Anticipated d/c date is: 2 days              Patient currently is not medically stable to d/c.        Marinda Elk MD [facility] Pager [phone]  If 7PM-7AM, please contact night-coverage [email or web address] Use universal [place] password for that web site. If you do not have the password, please call the hospital operator.  08/22/2019, 2:45 AM

## 2019-08-22 NOTE — Progress Notes (Addendum)
PROGRESS NOTE  Alejandra Crane YQI:[id number] DOB: 28-Sep-1950   PCP: [name], NP  Patient is from: Home.  DOA: 08/21/2019 LOS: 0  Brief Narrative / Interim history: 69 year old female with history of diverticular bleed, HTN, hyperlipidemia and class II obesity presenting with acute diverticular bleed for 1 day.  Had associated abdominal pain and nausea.  Not on anticoagulation or antiplatelets.  Denies NSAID use.  In ED, continued to have diverticular bleed with BRBPR.  Also had orthostatic syncope.  Hemodynamically stable.  Hgb 10.8 (baseline 12-13).  WBC 12.  Creatinine 1.07 (baseline 0.8-0.9).  BUN 25. HS trop, lactic acid influenza PCR and COVID-19 PCR negative.  EKG sinus rhythm with abnormal R wave progression but no acute finding.  Coag labs within normal.  Typed and screened.   Patient admitted for ABLA and orthostatic hypotension due to lower GI bleed.  GI consulted.  Subjective: Seen and examined earlier this morning.  No major events last night or this morning.  No further bowel movements or GI bleed since 10 PM last night.  Abdominal pain and nausea improved.  Denies chest pain, dyspnea, palpitation or dizziness.  She says she got up and went to the bathroom twice without problem.  Objective: Vitals:   08/22/19 0700 08/22/19 0800 08/22/19 1000 08/22/19 1200  BP: 123/73 103/72 103/62 112/64  Pulse: 71 (!) 58 62 63  Resp: (!) 21 17 17  (!) 25  Temp:      TempSrc:      SpO2: 100% 100% 93% 100%  Weight:      Height:        Intake/Output Summary (Last 24 hours) at 08/22/2019 1405 Last data filed at 08/22/2019 0214 Gross per 24 hour  Intake 500 ml  Output --  Net 500 ml   Filed Weights   08/21/19 2339  Weight: 83.9 kg    Examination:  GENERAL: No apparent distress. Nontoxic.  HEENT: MMM.  Vision and hearing grossly intact.  NECK: Supple.  No apparent JVD.  RESP:  No IWOB. Good air movement bilaterally. CVS:  RRR. Heart sounds normal.  ABD/GI/GU: BS  present. Soft. Non tender.  MSK/EXT:  Moves extremities. No apparent deformity. No edema.  SKIN: no apparent skin lesion or wound NEURO: Awake, alert and oriented appropriately.  No apparent focal neuro deficit. PSYCH: Calm. Normal affect.  Procedures:  None  Microbiology summarized: Influenza PCR negative. COVID-19 PCR negative.  Assessment & Plan: ABLA due to lower GI bleed likely due to acute diverticular bleed-Baseline Hgb 12-13> 10.8 (admit)> 9.2> 8.3. No further bleed or bowel movement since 10 PM last night but Hgb downtrending. May take some time for Hgb to reach equilibrium especially with IV fluid. Coag labs within normal.  Hemodynamically stable.   -Appreciate GI help. -Monitor H&H every 8 hours. -Start clear liquid diet. -Discontinue IV fluid. -Secure 2 PIV's -Continue PPI-although this might not be helpful in the setting of lower GI bleed -Already typed and screened. -If signs of further bleed, would get CTA angiogram to localize bleeding. -Continue telemetry monitoring  Orthostatic syncope: Likely due to the above. -Continue telemetry monitoring  Essential hypertension: Normotensive: Home on hydrochlorothiazide at home. -Continue monitoring  Hyperlipidemia -Continue Crestor  Class II obesity: Body mass index is 32.77 kg/m.  -Encourage lifestyle change to lose weight.  Leukocytosis: Likely demargination versus infectious process.  No tenderness to suggest diverticulitis. -Continue monitoring  Anxiety/depression: On Zoloft at home. -Resume home Zoloft  GERD -Continue Protonix  DVT prophylaxis: SCD due to GI bleed Code Status: Full code Family Communication: Patient and/or RN. Available if any question.  Discharge barrier: ABLA due to lower GI bleed.  Need close monitoring in the stepdown unit for acute GI bleed and orthostatic syncope. Patient is from: Home Final disposition: Likely home in the next 24 to 48 hours once GI bleed  resolves once, she tolerates diet and cleared by GI.  Consultants:  Gastroenterology   Sch Meds:  Scheduled Meds: . Calcium Carbonate-Vitamin D  1 tablet Oral QHS  . pantoprazole (PROTONIX) IV  40 mg Intravenous Q24H  . rosuvastatin  5 mg Oral QHS  . sertraline  100 mg Oral QHS   Continuous Infusions:  PRN Meds:.acetaminophen **OR** acetaminophen, morphine injection, ondansetron **OR** ondansetron (ZOFRAN) IV  Antimicrobials: Anti-infectives (From admission, onward)   None       I have personally reviewed the following labs and images: CBC: Recent Labs  Lab 08/21/19 2353 08/22/19 0430 08/22/19 0857 08/22/19 1309  WBC 11.7* 12.1*  --   --   HGB 10.8* 9.2* 7.6* 8.3*  HCT 34.2* 30.1* 24.6* 26.9*  MCV 99.1 100.7*  --   --   PLT 263 220  --   --    BMP &GFR Recent Labs  Lab 08/21/19 2353  NA 139  K 3.8  CL 106  CO2 25  GLUCOSE 118*  BUN 25*  CREATININE 1.07*  CALCIUM 9.0   Estimated Creatinine Clearance: 52.4 mL/min (A) (by C-G formula based on SCr of 1.07 mg/dL (H)). Liver & Pancreas: Recent Labs  Lab 08/21/19 2353  AST 20  ALT 14  ALKPHOS 61  BILITOT 0.3  PROT 6.2*  ALBUMIN 3.4*   No results for input(s): LIPASE, AMYLASE in the last 168 hours. No results for input(s): AMMONIA in the last 168 hours. Diabetic: No results for input(s): HGBA1C in the last 72 hours. No results for input(s): GLUCAP in the last 168 hours. Cardiac Enzymes: No results for input(s): CKTOTAL, CKMB, CKMBINDEX, TROPONINI in the last 168 hours. No results for input(s): PROBNP in the last 8760 hours. Coagulation Profile: Recent Labs  Lab 08/22/19 0430  INR 1.1   Thyroid Function Tests: No results for input(s): TSH, T4TOTAL, FREET4, T3FREE, THYROIDAB in the last 72 hours. Lipid Profile: No results for input(s): CHOL, HDL, LDLCALC, TRIG, CHOLHDL, LDLDIRECT in the last 72 hours. Anemia Panel: Recent Labs    08/22/19 0430  TIBC 246*  IRON 49   Urine analysis: No  results found for: COLORURINE, APPEARANCEUR, LABSPEC, PHURINE, GLUCOSEU, HGBUR, BILIRUBINUR, KETONESUR, PROTEINUR, UROBILINOGEN, NITRITE, LEUKOCYTESUR Sepsis Labs: Invalid input(s): PROCALCITONIN, LACTICIDVEN  Microbiology: Recent Results (from the past 240 hour(s))  Respiratory Panel by RT PCR (Flu A&B, Covid) - Nasopharyngeal Swab     Status: None   Collection Time: 08/22/19  2:06 AM   Specimen: Nasopharyngeal Swab  Result Value Ref Range Status   SARS Coronavirus 2 by RT PCR NEGATIVE NEGATIVE Final    Comment: (NOTE) SARS-CoV-2 target nucleic acids are NOT DETECTED. The SARS-CoV-2 RNA is generally detectable in upper respiratoy specimens during the acute phase of infection. The lowest concentration of SARS-CoV-2 viral copies this assay can detect is 131 copies/mL. A negative result does not preclude SARS-Cov-2 infection and should not be used as the sole basis for treatment or other patient management decisions. A negative result may occur with  improper specimen collection/handling, submission of specimen other than nasopharyngeal swab, presence of viral mutation(s) within the areas targeted by this  assay, and inadequate number of viral copies (<131 copies/mL). A negative result must be combined with clinical observations, patient history, and epidemiological information. The expected result is Negative. Fact Sheet for Patients:  PinkCheek.be Fact Sheet for Healthcare Providers:  GravelBags.it This test is not yet ap proved or cleared by the [place] FDA and  has been authorized for detection and/or diagnosis of SARS-CoV-2 by FDA under an Emergency Use Authorization (EUA). This EUA will remain  in effect (meaning this test can be used) for the duration of the COVID-19 declaration under Section 564(b)(1) of the Act, 21 U.S.C. section 360bbb-3(b)(1), unless the authorization is terminated or revoked sooner.     Influenza A by PCR NEGATIVE NEGATIVE Final   Influenza B by PCR NEGATIVE NEGATIVE Final    Comment: (NOTE) The Xpert Xpress SARS-CoV-2/FLU/RSV assay is intended as an aid in  the diagnosis of influenza from Nasopharyngeal swab specimens and  should not be used as a sole basis for treatment. Nasal washings and  aspirates are unacceptable for Xpert Xpress SARS-CoV-2/FLU/RSV  testing. Fact Sheet for Patients: PinkCheek.be Fact Sheet for Healthcare Providers: GravelBags.it This test is not yet approved or cleared by the [place] FDA and  has been authorized for detection and/or diagnosis of SARS-CoV-2 by  FDA under an Emergency Use Authorization (EUA). This EUA will remain  in effect (meaning this test can be used) for the duration of the  Covid-19 declaration under Section 564(b)(1) of the Act, 21  U.S.C. section 360bbb-3(b)(1), unless the authorization is  terminated or revoked. Performed at [facility], [place] [address], [place], [place] [address]     Radiology Studies: No results found.  35 minutes with more than 50% spent in reviewing records, counseling patient/family and coordinating care.   Earnestene Angello T. Kidron  If 7PM-7AM, please contact night-coverage [email or web address] Password [facility] 08/22/2019, 2:05 PM

## 2019-08-22 NOTE — Consult Note (Signed)
Consult Note for [name]  Reason for Consult: Diverticular bleed Referring Physician: [facility]  Alejandra Crane HPI: This is a 69 year old female with a PMH of a diverticular bleed 01/20/2015, GERD, and MVP admitted for a diverticular bleed.  Last evening around 7 PM she started to pass clots and hematochezia.  She had three episodes at home and then she presented to the ER.  Her HGB on admission was at 10.8 g/dL and her baseline HGB was in the 12 g/dL range.  She denied any issues with abdominal pain, nausea, or vomiting.  On 01/20/2015 she underwent an EGD/colonoscopy for her hematochezia and she was noted to have a pandiverticulosis.  The procedure was performed at [facility] in [place].  Two years prior she had a normal routine colonoscopy, outside of the findings of a pan diverticulosis.  In 2016 she presented the same way, which was with painless hematochezia.  Past Medical History:  Diagnosis Date  . Arthritis   . Depression   . Diverticulosis    noted on colonscopy   . Elevated cholesterol   . GERD (gastroesophageal reflux disease)   . H/O mammogram    2015, 2016  . 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 High blood pressure   . History of esophagogastroduodenoscopy (EGD)    2014, 2016  . History of PFTs 10/26/2012  . Lower GI bleed 01/19/2015   Related to mobic 15 mg   . Mitral valve prolapse 09/06/1996  . Osteoarthritis of both knees     Past Surgical History:  Procedure Laterality Date  . BREAST EXCISIONAL BIOPSY Left    benign  . BREAST SURGERY  05/20/2007   [facility], Dr.William Purkert   . COLONOSCOPY     2014, 2016 Dr.Bajani Manalo, Dr.Mahmood Abedi     Family History  Problem Relation Age of Onset  . Heart attack Mother   . CVA Mother   . Diabetes Mother   . Congestive Heart Failure Mother   . Stroke Mother   . Heart disease Mother   . Hyperlipidemia Mother   . Hypertension Mother   . Depression Mother   . Obesity Mother   . Lung cancer  Sister 72  . Cancer Brother        GI or colon cancer  . Neurologic Disorder Son   . Cancer Other        Niece-colon cancer  . Diabetes Other        Nephew    Social History:  reports that she has quit smoking. She has a 2.50 pack-year smoking history. She has never used smokeless tobacco. She reports current alcohol use. She reports that she does not use drugs.  Allergies:  Allergies  Allergen Reactions  . Mobic [Meloxicam]     GI bleed    Medications:  Scheduled: . pantoprazole (PROTONIX) IV  40 mg Intravenous Q24H   Continuous: . lactated ringers 125 mL/hr at 08/22/19 0444    Results for orders placed or performed during the hospital encounter of 08/21/19 (from the past 24 hour(s))  Type and screen [place]     Status: None   Collection Time: 08/21/19 11:50 PM  Result Value Ref Range   ABO/RH(D) A POS    Antibody Screen NEG    Sample Expiration      08/24/2019,2359 Performed at [facility], [place] [address], [place], [place] [address]   ABO/Rh     Status: None (Preliminary result)  Collection Time: 08/21/19 11:50 PM  Result Value Ref Range   ABO/RH(D)      A POS Performed at [facility], [address] [place], [place] [address]   Comprehensive metabolic panel     Status: Abnormal   Collection Time: 08/21/19 11:53 PM  Result Value Ref Range   Sodium 139 135 - 145 mmol/L   Potassium 3.8 3.5 - 5.1 mmol/L   Chloride 106 98 - 111 mmol/L   CO2 25 22 - 32 mmol/L   Glucose, Bld 118 (H) 70 - 99 mg/dL   BUN 25 (H) 8 - 23 mg/dL   Creatinine, Ser 0.27 (H) 0.44 - 1.00 mg/dL   Calcium 9.0 8.9 - 25.3 mg/dL   Total Protein 6.2 (L) 6.5 - 8.1 g/dL   Albumin 3.4 (L) 3.5 - 5.0 g/dL   AST 20 15 - 41 U/L   ALT 14 0 - 44 U/L   Alkaline Phosphatase 61 38 - 126 U/L   Total Bilirubin 0.3 0.3 - 1.2 mg/dL   GFR calc non Af Amer 54 (L) >60 mL/min   GFR calc Af Amer >60 >60 mL/min   Anion gap 8 5 - 15  CBC     Status: Abnormal   Collection  Time: 08/21/19 11:53 PM  Result Value Ref Range   WBC 11.7 (H) 4.0 - 10.5 K/uL   RBC 3.45 (L) 3.87 - 5.11 MIL/uL   Hemoglobin 10.8 (L) 12.0 - 15.0 g/dL   HCT 66.4 (L) 40.3 - 47.4 %   MCV 99.1 80.0 - 100.0 fL   MCH 31.3 26.0 - 34.0 pg   MCHC 31.6 30.0 - 36.0 g/dL   RDW 25.9 56.3 - 87.5 %   Platelets 263 150 - 400 K/uL   nRBC 0.0 0.0 - 0.2 %  Lactic acid, plasma     Status: None   Collection Time: 08/22/19  1:25 AM  Result Value Ref Range   Lactic Acid, Venous 1.3 0.5 - 1.9 mmol/L  Respiratory Panel by RT PCR (Flu A&B, Covid) - Nasopharyngeal Swab     Status: None   Collection Time: 08/22/19  2:06 AM   Specimen: Nasopharyngeal Swab  Result Value Ref Range   SARS Coronavirus 2 by RT PCR NEGATIVE NEGATIVE   Influenza A by PCR NEGATIVE NEGATIVE   Influenza B by PCR NEGATIVE NEGATIVE  Lactic acid, plasma     Status: None   Collection Time: 08/22/19  4:30 AM  Result Value Ref Range   Lactic Acid, Venous 1.0 0.5 - 1.9 mmol/L  CBC     Status: Abnormal   Collection Time: 08/22/19  4:30 AM  Result Value Ref Range   WBC 12.1 (H) 4.0 - 10.5 K/uL   RBC 2.99 (L) 3.87 - 5.11 MIL/uL   Hemoglobin 9.2 (L) 12.0 - 15.0 g/dL   HCT 64.3 (L) 32.9 - 51.8 %   MCV 100.7 (H) 80.0 - 100.0 fL   MCH 30.8 26.0 - 34.0 pg   MCHC 30.6 30.0 - 36.0 g/dL   RDW 84.1 66.0 - 63.0 %   Platelets 220 150 - 400 K/uL   nRBC 0.0 0.0 - 0.2 %  Protime-INR     Status: None   Collection Time: 08/22/19  4:30 AM  Result Value Ref Range   Prothrombin Time 14.5 11.4 - 15.2 seconds   INR 1.1 0.8 - 1.2  APTT     Status: None   Collection Time: 08/22/19  4:30 AM  Result  Value Ref Range   aPTT 24 24 - 36 seconds  Iron and TIBC     Status: Abnormal   Collection Time: 08/22/19  4:30 AM  Result Value Ref Range   Iron 49 28 - 170 ug/dL   TIBC 496 (L) 759 - 163 ug/dL   Saturation Ratios 20 10.4 - 31.8 %   UIBC 197 ug/dL  Troponin I (High Sensitivity)     Status: None   Collection Time: 08/22/19  4:30 AM  Result Value Ref  Range   Troponin I (High Sensitivity) 3 <18 ng/L     No results found.  ROS:  As stated above in the HPI otherwise negative.  Blood pressure 103/72, pulse (!) 58, temperature 98.6 F (37 C), temperature source Oral, resp. rate 17, height 5\' 3"  (1.6 m), weight 83.9 kg, SpO2 100 %.    PE: Gen: NAD, Alert and Oriented HEENT:  [place]/AT, EOMI Neck: Supple, no LAD Lungs: CTA Bilaterally CV: RRR without M/G/R ABM: Soft, NTND, +BS Ext: No C/C/E  Assessment/Plan: 1) Diverticular bleed. 2) Anemia.   I was able to review the colonoscopy notes and the examination showed a pandiverticulosis.  I am familiar with the GI physician who performed the colonoscopy.  The preparation was excellent.  Given that her current presentation is exactly the same as her 2016 bleed, and her 2014 colonoscopy only showed diverticulosis, it is presumed that she currently has a recurrent diverticular bleed.  No other abnormalities were noted in the prior colonoscopies.  Plan: 1) Follow HGB and transfuse as necessary. 2) ? Home tomorrow if she does not have any further bleeding. 3) [facility] to assume care in the AM.  Alejandra Crane D 08/22/2019, 9:01 AM

## 2019-08-22 NOTE — ED Notes (Signed)
Report given to 5w.   

## 2019-08-22 NOTE — ED Provider Notes (Signed)
[facility] EMERGENCY DEPARTMENT Provider Note   CSN: [id number] Arrival date & time: 08/21/19  2319     History Chief Complaint  Patient presents with  . GI Bleeding    Alejandra Crane is a 69 y.o. female.  HPI     This is a 69 year old female with a history of hypertension, diverticulosis, mitral valve prolapse who presents with bright red blood per rectum.  Patient reports onset of bright red rectal bleeding yesterday at 7 PM.  She has a history of the same which she thinks may have been related to taking Mobic versus diverticulosis.  She states that she was observantly managed at an outside hospital.  She reports some crampy abdominal discomfort "like I need to have a bowel movement."  Denies any localized abdominal pain.  She is not on any blood thinners.  She does not take NSAIDs.  She states that she generally is healthy and exercises regularly.  Of note, she was brought back emergently from the waiting room because she had a syncopal episode.  Patient reports that she began to feel lightheaded and walked up to the counter to let the triage nurse know.  She slid onto the floor and blacked out.  She states that she does not believe that she hit her head.  She not have any chest pain or shortness of breath.  No history of syncope in the past.  Per nursing she has had a large bright red bloody bowel movement. Past Medical History:  Diagnosis Date  . Arthritis   . Depression   . Diverticulosis    noted on colonscopy   . Elevated cholesterol   . GERD (gastroesophageal reflux disease)   . H/O mammogram    2015, 2016  . 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 High blood pressure   . History of esophagogastroduodenoscopy (EGD)    2014, 2016  . History of PFTs 10/26/2012  . Lower GI bleed 01/19/2015   Related to mobic 15 mg   . Mitral valve prolapse 09/06/1996  . Osteoarthritis of both knees     Patient Active Problem List   Diagnosis Date Noted  . Osteopenia 09/16/2018   . Essential hypertension 02/19/2017  . Mixed hyperlipidemia 02/19/2017  . Depression, major, single episode, complete remission (HCC) 02/19/2017  . Herpes genitalis in women 02/19/2017    Past Surgical History:  Procedure Laterality Date  . BREAST EXCISIONAL BIOPSY Left    benign  . BREAST SURGERY  05/20/2007   [facility], Dr.William Purkert   . COLONOSCOPY     2014, 2016 Dr.Bajani Manalo, Dr.Mahmood Abedi      OB History    Gravida  1   Para      Term      Preterm      AB      Living  0     SAB      TAB      Ectopic      Multiple      Live Births              Family History  Problem Relation Age of Onset  . Heart attack Mother   . CVA Mother   . Diabetes Mother   . Congestive Heart Failure Mother   . Stroke Mother   . Heart disease Mother   . Hyperlipidemia Mother   . Hypertension Mother   . Depression Mother   . Obesity Mother   . Lung cancer Sister  20  . Cancer Brother        GI or colon cancer  . Neurologic Disorder Son   . Cancer Other        Niece-colon cancer  . Diabetes Other        Nephew    Social History   Tobacco Use  . Smoking status: Former Smoker    Packs/day: 0.25    Years: 10.00    Pack years: 2.50  . Smokeless tobacco: Never Used  . Tobacco comment: Quit in her 66's   Substance Use Topics  . Alcohol use: Yes    Comment: Occasionally red wine   . Drug use: No    Home Medications Prior to Admission medications   Medication Sig Start Date End Date Taking? Authorizing Provider  Calcium Carbonate-Vitamin D (CALTRATE 600+D) 600-400 MG-UNIT tablet Take 1 tablet by mouth 2 (two) times daily. Patient taking differently: Take 1 tablet by mouth at bedtime.  09/19/17  Yes Gildardo Cranker, DO  Diclofenac Sodium (PENNSAID) 2 % SOLN Apply 2 g topically 2 (two) times daily as needed (to affected area). 04/08/19  Yes Leandrew Koyanagi, MD  hydrochlorothiazide (HYDRODIURIL) 25 MG tablet TAKE 1 TABLET BY MOUTH DAILY. Patient taking  differently: Take 25 mg by mouth at bedtime.  06/15/19  Yes Lauree Chandler, NP  rosuvastatin (CRESTOR) 5 MG tablet Take 1 tablet (5 mg total) by mouth at bedtime. 08/11/19  Yes Lauree Chandler, NP  sertraline (ZOLOFT) 100 MG tablet TAKE 1 TABLET BY MOUTH DAILY. Patient taking differently: Take 100 mg by mouth at bedtime.  06/15/19  Yes Lauree Chandler, NP  valACYclovir (VALTREX) 500 MG tablet Take 1 tablet (500 mg total) by mouth daily. Patient taking differently: Take 500 mg by mouth at bedtime.  03/23/18  Yes Lauree Chandler, NP  Vitamin D, Ergocalciferol, (DRISDOL) 1.25 MG (50000 UNIT) CAPS capsule Take 1 capsule (50,000 Units total) by mouth every 7 (seven) days. Patient taking differently: Take 50,000 Units by mouth every Friday.  08/12/19  Yes Abby Potash, PA-C    Allergies    Mobic [meloxicam]  Review of Systems   Review of Systems  Constitutional: Negative for fever.  Respiratory: Negative for shortness of breath.   Cardiovascular: Negative for chest pain.  Gastrointestinal: Positive for blood in stool. Negative for abdominal pain, diarrhea and vomiting.  Genitourinary: Negative for dysuria.  Musculoskeletal: Negative for back pain.  Neurological: Positive for syncope.  All other systems reviewed and are negative.   Physical Exam Updated Vital Signs BP (!) 127/92 (BP Location: Left Arm)   Pulse (!) 108   Temp 98.6 F (37 C) (Oral)   Resp 18   Ht 1.6 m (5\' 3" )   Wt 83.9 kg   SpO2 100%   BMI 32.77 kg/m   Physical Exam Vitals and nursing note reviewed.  Constitutional:      Appearance: She is well-developed.     Comments: Pleasant, overweight, nontoxic-appearing  HENT:     Head: Normocephalic and atraumatic.  Eyes:     Pupils: Pupils are equal, round, and reactive to light.  Cardiovascular:     Rate and Rhythm: Regular rhythm. Tachycardia present.     Heart sounds: Normal heart sounds.  Pulmonary:     Effort: Pulmonary effort is normal. No respiratory  distress.     Breath sounds: No wheezing.  Abdominal:     General: Bowel sounds are normal.     Palpations: Abdomen is soft.  Tenderness: There is no abdominal tenderness.  Musculoskeletal:     Cervical back: Neck supple.     Right lower leg: No edema.     Left lower leg: No edema.  Skin:    General: Skin is warm and dry.  Neurological:     Mental Status: She is alert and oriented to person, place, and time.  Psychiatric:        Mood and Affect: Mood normal.     ED Results / Procedures / Treatments   Labs (all labs ordered are listed, but only abnormal results are displayed) Labs Reviewed  COMPREHENSIVE METABOLIC PANEL - Abnormal; Notable for the following components:      Result Value   Glucose, Bld 118 (*)    BUN 25 (*)    Creatinine, Ser 1.07 (*)    Total Protein 6.2 (*)    Albumin 3.4 (*)    GFR calc non Af Amer 54 (*)    All other components within normal limits  CBC - Abnormal; Notable for the following components:   WBC 11.7 (*)    RBC 3.45 (*)    Hemoglobin 10.8 (*)    HCT 34.2 (*)    All other components within normal limits  LACTIC ACID, PLASMA  LACTIC ACID, PLASMA  POC OCCULT BLOOD, ED  TYPE AND SCREEN  ABO/RH    EKG EKG Interpretation  Date/Time:  Sunday August 22 2019 00:30:23 EDT Ventricular Rate:  80 PR Interval:    QRS Duration: 94 QT Interval:  380 QTC Calculation: 439 R Axis:   61 Text Interpretation: Sinus rhythm Abnormal R-wave progression, early transition Minimal ST elevation, anterior leads Confirmed by Ross Marcus ([id number]) on 08/22/2019 12:57:00 AM   Radiology No results found.  Procedures Procedures (including critical care time)  CRITICAL CARE Performed by: Shon Baton   Total critical care time: 31 minutes  Critical care time was exclusive of separately billable procedures and treating other patients.  Critical care was necessary to treat or prevent imminent or life-threatening deterioration.  Critical  care was time spent personally by me on the following activities: development of treatment plan with patient and/or surrogate as well as nursing, discussions with consultants, evaluation of patient's response to treatment, examination of patient, obtaining history from patient or surrogate, ordering and performing treatments and interventions, ordering and review of laboratory studies, ordering and review of radiographic studies, pulse oximetry and re-evaluation of patient's condition.  Medications Ordered in ED Medications  sodium chloride 0.9 % bolus 500 mL (500 mLs Intravenous New Bag/Given 08/22/19 0119)    ED Course  I have reviewed the triage vital signs and the nursing notes.  Pertinent labs & imaging results that were available during my care of the patient were reviewed by me and considered in my medical decision making (see chart for details).  Clinical Course as of Aug 22 127  Sun Aug 22, 2019  0128 Spoke with Dr. Elnoria Howard, GI.  Appreciate his consult.  He is aware of the patient and agrees with current management.  He will see her in the morning unless otherwise advised of decompensation overnight.   [CH]    Clinical Course User Index [CH] Diany Formosa, Mayer Masker, MD   MDM Rules/Calculators/A&P                       Patient presents with bright red blood per rectum.  Had a syncopal episode in the waiting room.  On my evaluation, she  is slightly tachycardic but her blood pressure is within normal range at 127/92.  She currently states that she no longer feels lightheaded.  Suspect acute blood loss as the cause of her syncope.  EKG without acute arrhythmia or evidence of ischemia.  She has had ongoing bright red bloody bowel movements.  She brought the results of her prior colonoscopy to the bedside.  I have reviewed those results.  She had fairly severe diverticulosis.  Given her presentation, would have high suspicion for diverticular bleed.  Patient was typed and screened.  2 large-bore  IVs were established.  She was given resuscitative fluids.  Initial hemoglobin is 10.8 decreased from a baseline around 12.5.  Suspect that this does not reflect acute blood loss given her syncope.  Discussed with her admission for GI evaluation and resuscitation.  Patient stated understanding.  I did speak with Dr. Elnoria Howard, as above.  I also added on a lactate per hospitalist request to further evaluate for possible other etiologies including mesenteric ischemia.  After history, exam, and medical workup I feel the patient has been appropriately medically screened and is safe for discharge home. Pertinent diagnoses were discussed with the patient. Patient was given return precautions.   Final Clinical Impression(s) / ED Diagnoses Final diagnoses:  Acute GI bleeding  Syncope, unspecified syncope type    Rx / DC Orders ED Discharge Orders    None       Shon Baton, MD 08/22/19 [phone]

## 2019-08-23 DIAGNOSIS — I10 Essential (primary) hypertension: Secondary | ICD-10-CM

## 2019-08-23 DIAGNOSIS — E782 Mixed hyperlipidemia: Secondary | ICD-10-CM | POA: Diagnosis not present

## 2019-08-23 DIAGNOSIS — K922 Gastrointestinal hemorrhage, unspecified: Secondary | ICD-10-CM | POA: Diagnosis not present

## 2019-08-23 DIAGNOSIS — E669 Obesity, unspecified: Secondary | ICD-10-CM

## 2019-08-23 LAB — CBC WITH DIFFERENTIAL/PLATELET
Abs Immature Granulocytes: 0.01 10*3/uL (ref 0.00–0.07)
Abs Immature Granulocytes: 0.02 10*3/uL (ref 0.00–0.07)
Basophils Absolute: 0 10*3/uL (ref 0.0–0.1)
Basophils Absolute: 0 10*3/uL (ref 0.0–0.1)
Basophils Relative: 0 %
Basophils Relative: 0 %
Eosinophils Absolute: 0.1 10*3/uL (ref 0.0–0.5)
Eosinophils Absolute: 0.1 10*3/uL (ref 0.0–0.5)
Eosinophils Relative: 1 %
Eosinophils Relative: 1 %
HCT: 24.5 % — ABNORMAL LOW (ref 36.0–46.0)
HCT: 27.5 % — ABNORMAL LOW (ref 36.0–46.0)
Hemoglobin: 7.8 g/dL — ABNORMAL LOW (ref 12.0–15.0)
Hemoglobin: 8.7 g/dL — ABNORMAL LOW (ref 12.0–15.0)
Immature Granulocytes: 0 %
Immature Granulocytes: 0 %
Lymphocytes Relative: 49 %
Lymphocytes Relative: 37 %
Lymphs Abs: 3.4 10*3/uL (ref 0.7–4.0)
Lymphs Abs: 3 10*3/uL (ref 0.7–4.0)
MCH: 31 pg (ref 26.0–34.0)
MCH: 31 pg (ref 26.0–34.0)
MCHC: 31.8 g/dL (ref 30.0–36.0)
MCHC: 31.6 g/dL (ref 30.0–36.0)
MCV: 97.2 fL (ref 80.0–100.0)
MCV: 97.9 fL (ref 80.0–100.0)
Monocytes Absolute: 0.6 10*3/uL (ref 0.1–1.0)
Monocytes Absolute: 0.6 10*3/uL (ref 0.1–1.0)
Monocytes Relative: 8 %
Monocytes Relative: 8 %
Neutro Abs: 3 10*3/uL (ref 1.7–7.7)
Neutro Abs: 4.5 10*3/uL (ref 1.7–7.7)
Neutrophils Relative %: 42 %
Neutrophils Relative %: 54 %
Platelets: 202 10*3/uL (ref 150–400)
Platelets: 239 10*3/uL (ref 150–400)
RBC: 2.52 MIL/uL — ABNORMAL LOW (ref 3.87–5.11)
RBC: 2.81 MIL/uL — ABNORMAL LOW (ref 3.87–5.11)
RDW: 14.1 % (ref 11.5–15.5)
RDW: 14 % (ref 11.5–15.5)
WBC: 7.1 10*3/uL (ref 4.0–10.5)
WBC: 8.3 10*3/uL (ref 4.0–10.5)
nRBC: 0 % (ref 0.0–0.2)
nRBC: 0 % (ref 0.0–0.2)

## 2019-08-23 LAB — COMPREHENSIVE METABOLIC PANEL
ALT: 13 U/L (ref 0–44)
AST: 18 U/L (ref 15–41)
Albumin: 2.5 g/dL — ABNORMAL LOW (ref 3.5–5.0)
Alkaline Phosphatase: 48 U/L (ref 38–126)
Anion gap: 8 (ref 5–15)
BUN: 18 mg/dL (ref 8–23)
CO2: 25 mmol/L (ref 22–32)
Calcium: 8.8 mg/dL — ABNORMAL LOW (ref 8.9–10.3)
Chloride: 110 mmol/L (ref 98–111)
Creatinine, Ser: 0.97 mg/dL (ref 0.44–1.00)
GFR calc Af Amer: >60 mL/min (ref >60)
GFR calc non Af Amer: >60 mL/min (ref >60)
Glucose, Bld: 97 mg/dL (ref 70–99)
Potassium: 3.5 mmol/L (ref 3.5–5.1)
Sodium: 143 mmol/L (ref 135–145)
Total Bilirubin: 0.4 mg/dL (ref 0.3–1.2)
Total Protein: 5 g/dL — ABNORMAL LOW (ref 6.5–8.1)

## 2019-08-23 LAB — MAGNESIUM: Magnesium: 1.9 mg/dL (ref 1.7–2.4)

## 2019-08-23 MED ORDER — DOCUSATE SODIUM 100 MG PO CAPS: 100.0000 mg | ORAL_CAPSULE | Freq: Two times a day (BID) | ORAL | 2 refills | Status: DC

## 2019-08-23 MED ORDER — POLYETHYLENE GLYCOL 3350 17 GM/SCOOP PO POWD: 17.0000 g | Freq: Two times a day (BID) | ORAL | 0 refills | Status: DC | PRN

## 2019-08-23 MED ORDER — FERROUS SULFATE 325 (65 FE) MG PO TBEC: 325.0000 mg | DELAYED_RELEASE_TABLET | Freq: Two times a day (BID) | ORAL | 1 refills | Status: DC

## 2019-08-23 MED FILL — POLYETHYLENE GLYCOL 3350 PO: 17 | 14 days supply | Qty: 238 | Fill #0

## 2019-08-23 MED FILL — FERROUS SULFATE 325 MG TAB: 325 (65 FE) | 90 days supply | Qty: 180 | Fill #0

## 2019-08-23 NOTE — Progress Notes (Signed)
     Progress Note    ASSESSMENT AND PLAN:   1.  LGI bleed likely diverticular.  Negative colonoscopy 2014 and September 2016 except for pandiverticular disease.  Good preparation.  Patient has reports and pictures. 2.  Anemia  Plan: -Recheck CBC at 4 PM today.  If okay, can discharge home with GI follow-up in 3 to 4 weeks. -Can take over-the-counter iron tablets 65 mg p.o. once a day.  Would likely need stool softeners. -High-fiber diet -We will sign off for now.     SUBJECTIVE   No further bleeding except for old blood Minimal abdominal discomfort. Did feel dizzy but better now. No nausea, vomiting, heartburn, regurgitation, odynophagia or dysphagia.  No significant diarrhea or constipation.  No melena or hematochezia. No unintentional weight loss.     OBJECTIVE:     Vital signs in last 24 hours: Temp:  [97.9 F (36.6 C)-98.7 F (37.1 C)] 97.9 F (36.6 C) (04/19 0737) Pulse Rate:  [63-74] 73 (04/19 0737) Resp:  [18-25] 18 (04/19 0418) BP: (95-134)/(59-72) 115/67 (04/19 0737) SpO2:  [100 %] 100 % (04/19 0737) Weight:  [90.5 kg] 90.5 kg (04/18 1646) Last BM Date: 08/23/19 General:   Alert, well-developed female in NAD EENT:  Normal hearing, non icteric sclera, conjunctive pink.  Heart:  Regular rate and rhythm; no murmur.  No lower extremity edema   Pulm: Normal respiratory effort, lungs CTA bilaterally without wheezes or crackles. Abdomen:  Soft, nondistended, nontender.  Normal bowel sounds,.       Neurologic:  Alert and  oriented x4;  grossly normal neurologically. Psych:  Pleasant, cooperative.  Normal mood and affect.   Intake/Output from previous day: 04/18 0701 - 04/19 0700 In: 1612.9 [P.O.:240; I.V.:1372.9] Out: -  Intake/Output this shift: No intake/output data recorded.  Lab Results: Recent Labs    08/21/19 2353 08/21/19 2353 08/22/19 0430 08/22/19 0857 08/22/19 1309 08/22/19 1851 08/23/19 0434  WBC 11.7*  --  12.1*  --   --   --  7.1   HGB 10.8*   < > 9.2*   < > 8.3* 8.5* 7.8*  HCT 34.2*   < > 30.1*   < > 26.9* 26.9* 24.5*  PLT 263  --  220  --   --   --  202   < > = values in this interval not displayed.   BMET Recent Labs    08/21/19 2353 08/23/19 0434  NA 139 143  K 3.8 3.5  CL 106 110  CO2 25 25  GLUCOSE 118* 97  BUN 25* 18  CREATININE 1.07* 0.97  CALCIUM 9.0 8.8*   LFT Recent Labs    08/23/19 0434  PROT 5.0*  ALBUMIN 2.5*  AST 18  ALT 13  ALKPHOS 48  BILITOT 0.4   PT/INR Recent Labs    08/22/19 0430  LABPROT 14.5  INR 1.1   Hepatitis Panel No results for input(s): HEPBSAG, HCVAB, HEPAIGM, HEPBIGM in the last 72 hours.  No results found.   Active Problems:   Essential hypertension   Mixed hyperlipidemia   Acute lower GI bleeding   Orthostatic syncope     LOS: 1 day     Edman Circle, MD 08/23/2019, 10:07 AM [facility] [phone]

## 2019-08-23 NOTE — Consult Note (Signed)
   [facility] CM Inpatient Consult   08/23/2019  Alejandra Crane 01/04/1951 [id number]     Patient is currently in the list for post hospital follow up.  Patient will be engaged by a [facility] Care Coordinator for the [facility] plan.  Spoke with the patient via hospital bedside phone regarding [facility] care management team involvement for post hospital follow up for support and needs. HIPAA verified an phone number and all listed is correct.  Patient will receive a post hospital call and will be evaluated for assessments and disease process education.     Plan: Patient will be followed by [facility] RN Care Coordinator.   For additional questions or referrals please contact:   Alejandra Shanks, RN BSN CCM [facility]  [phone] business mobile phone Toll free office [phone]  Fax number: [phone] [email or web address]

## 2019-08-23 NOTE — Progress Notes (Signed)
Patient tolerated well soft diet.

## 2019-08-23 NOTE — Progress Notes (Signed)
Patient was discharged home by MD order; discharged instructions review and give to patient with care notes; IV DIC; skin intact; patient refused to be escorted to the car by staff; she said she wants to walk to the car.

## 2019-08-23 NOTE — Discharge Summary (Signed)
Physician Discharge Summary  Alejandra Crane HYW:[id number] DOB: 12-Nov-1950 DOA: 08/21/2019  PCP: [name], NP  Admit date: 08/21/2019 Discharge date: 08/23/2019  Admitted From: Home Disposition: Home  Recommendations for Outpatient Follow-up:  1. Follow ups as below. 2. Please obtain CBC/BMP/Mag at follow up 3. Please follow up on the following pending results: None  Home Health: None Equipment/Devices: None  Discharge Condition: Stable CODE STATUS: Full code  Follow-up Information    [name], NP. Schedule an appointment as soon as possible for a visit in 1 week(s).   Specialty: Geriatric Medicine Contact information: [address] [place] [address] [phone]           Hospital Course: 69 year old female with history of diverticular bleed, HTN, hyperlipidemia and class II obesity presenting with acute diverticular bleed for 1 day.  Had associated abdominal pain and nausea.  Not on anticoagulation or antiplatelets.  Denies NSAID use.  In ED, continued to have diverticular bleed with BRBPR.  Also had orthostatic syncope.  Hemodynamically stable.  Hgb 10.8 (baseline 12-13).  WBC 12.  Creatinine 1.07 (baseline 0.8-0.9).  BUN 25. HS trop, lactic acid influenza PCR and COVID-19 PCR negative.  EKG sinus rhythm with abnormal R wave progression but no acute finding.  Coag labs within normal.  Typed and screened.   Patient admitted for ABLA and orthostatic hypotension due to lower GI bleed.  GI consulted.  Bleeding ceased and H&H stabilized.  Symptoms resolved.  She tolerated soft diet.  Cleared for discharge by GI.  Discharged on  p.o. iron with high-fiber diet and stool softeners as recommended by GI.  Patient to follow-up with PCP in a week for CBC check.    Discharge Diagnoses:  ABLA due to lower GI bleed likely due to acute diverticular bleed-Baseline Hgb 12-13> 10.8 (admit)> 9.2> 8.3>>8.7. Coag labs within normal.  Tolerated soft diet.  Hemodynamically stable.   -Discharged on p.o. iron and stool softer -Outpatient follow-up with PCP for CBC check in 1 week  Orthostatic syncope: Likely due to the above.  Resolved.  Essential hypertension: Normotensive:  -Discharged on home hydrochlorothiazide.  Hyperlipidemia -Continue Crestor  Class II obesity: Body mass index is 32.77 kg/m.  -Encourage lifestyle change to lose weight.  Leukocytosis: Likely demargination versus infectious process.  No tenderness to suggest diverticulitis.  Resolved.  Anxiety/depression: Stable. -Discharged on home Zoloft.               Discharge Instructions  Discharge Instructions    Call MD for:   Complete by: As directed    Further rectal bleed.   Call MD for:  difficulty breathing, headache or visual disturbances   Complete by: As directed    Call MD for:  extreme fatigue   Complete by: As directed    Call MD for:  persistant dizziness or light-headedness   Complete by: As directed    Call MD for:  persistant nausea and vomiting   Complete by: As directed    Diet - low sodium heart healthy   Complete by: As directed    Soft texture diet with a lot of fibers   Discharge instructions   Complete by: As directed    It has been a pleasure taking care of you! You were hospitalized due to gastrointestinal bleed likely due to diverticulosis.  It seems bleeding has subsided and your symptoms resolved.  We are discharging you on iron pills with stool softeners.  Iron pills could make your stools look  dark. Please review your new medication list and the directions before you take your medications.  Please follow-up with your primary care doctor in 1 week to have your blood levels rechecked.  Gastroenterologist office may arrange outpatient follow-up in 3 to 4 weeks.  You can also ask your primary care doctor for referral to gastroenterology if you do not hear from the gastroenterology office in the next 1 to 2 weeks.   Take  care,   Increase activity slowly   Complete by: As directed      Allergies as of 08/23/2019      Reactions   Mobic [meloxicam]    GI bleed      Medication List    STOP taking these medications   Pennsaid 2 % Soln Generic drug: Diclofenac Sodium     TAKE these medications   Calcium Carbonate-Vitamin D 600-400 MG-UNIT tablet Commonly known as: Caltrate 600+D Take 1 tablet by mouth 2 (two) times daily. What changed: when to take this   docusate sodium 100 MG capsule Commonly known as: Colace Take 1 capsule (100 mg total) by mouth 2 (two) times daily.   ferrous sulfate 325 (65 FE) MG EC tablet Take 1 tablet (325 mg total) by mouth 2 (two) times daily.   hydrochlorothiazide 25 MG tablet Commonly known as: HYDRODIURIL TAKE 1 TABLET BY MOUTH DAILY. What changed: when to take this   polyethylene glycol powder 17 GM/SCOOP powder Commonly known as: MiraLax Take 17 g by mouth 2 (two) times daily as needed for moderate constipation.   rosuvastatin 5 MG tablet Commonly known as: CRESTOR Take 1 tablet (5 mg total) by mouth at bedtime.   sertraline 100 MG tablet Commonly known as: ZOLOFT TAKE 1 TABLET BY MOUTH DAILY. What changed: when to take this   valACYclovir 500 MG tablet Commonly known as: VALTREX Take 1 tablet (500 mg total) by mouth daily. What changed: when to take this   Vitamin D (Ergocalciferol) 1.25 MG (50000 UNIT) Caps capsule Commonly known as: DRISDOL Take 1 capsule (50,000 Units total) by mouth every 7 (seven) days. What changed: when to take this       Consultations:  Gastroenterology  Procedures/Studies:    No results found.    Discharge Exam: Vitals:   08/23/19 0418 08/23/19 0737  BP: 95/72 115/67  Pulse: 71 73  Resp: 18   Temp: 98.5 F (36.9 C) 97.9 F (36.6 C)  SpO2: 100% 100%    GENERAL: No acute distress.  Appears well.  HEENT: MMM.  Vision and hearing grossly intact.  NECK: Supple.  No apparent JVD.  RESP:  No IWOB.  Good air movement bilaterally. CVS:  RRR. Heart sounds normal.  ABD/GI/GU: Bowel sounds present. Soft. Non tender.  MSK/EXT:  Moves extremities. No apparent deformity or edema.  SKIN: no apparent skin lesion or wound NEURO: Awake, alert and oriented appropriately.  No apparent focal neuro deficit. PSYCH: Calm. Normal affect.    The results of significant diagnostics from this hospitalization (including imaging, microbiology, ancillary and laboratory) are listed below for reference.     Microbiology: Recent Results (from the past 240 hour(s))  Respiratory Panel by RT PCR (Flu A&B, Covid) - Nasopharyngeal Swab     Status: None   Collection Time: 08/22/19  2:06 AM   Specimen: Nasopharyngeal Swab  Result Value Ref Range Status   SARS Coronavirus 2 by RT PCR NEGATIVE NEGATIVE Final    Comment: (NOTE) SARS-CoV-2 target nucleic acids are NOT DETECTED. The SARS-CoV-2  RNA is generally detectable in upper respiratoy specimens during the acute phase of infection. The lowest concentration of SARS-CoV-2 viral copies this assay can detect is 131 copies/mL. A negative result does not preclude SARS-Cov-2 infection and should not be used as the sole basis for treatment or other patient management decisions. A negative result may occur with  improper specimen collection/handling, submission of specimen other than nasopharyngeal swab, presence of viral mutation(s) within the areas targeted by this assay, and inadequate number of viral copies (<131 copies/mL). A negative result must be combined with clinical observations, patient history, and epidemiological information. The expected result is Negative. Fact Sheet for Patients:  PinkCheek.be Fact Sheet for Healthcare Providers:  GravelBags.it This test is not yet ap proved or cleared by the [place] FDA and  has been authorized for detection and/or diagnosis of SARS-CoV-2 by FDA under  an Emergency Use Authorization (EUA). This EUA will remain  in effect (meaning this test can be used) for the duration of the COVID-19 declaration under Section 564(b)(1) of the Act, 21 U.S.C. section 360bbb-3(b)(1), unless the authorization is terminated or revoked sooner.    Influenza A by PCR NEGATIVE NEGATIVE Final   Influenza B by PCR NEGATIVE NEGATIVE Final    Comment: (NOTE) The Xpert Xpress SARS-CoV-2/FLU/RSV assay is intended as an aid in  the diagnosis of influenza from Nasopharyngeal swab specimens and  should not be used as a sole basis for treatment. Nasal washings and  aspirates are unacceptable for Xpert Xpress SARS-CoV-2/FLU/RSV  testing. Fact Sheet for Patients: PinkCheek.be Fact Sheet for Healthcare Providers: GravelBags.it This test is not yet approved or cleared by the [place] FDA and  has been authorized for detection and/or diagnosis of SARS-CoV-2 by  FDA under an Emergency Use Authorization (EUA). This EUA will remain  in effect (meaning this test can be used) for the duration of the  Covid-19 declaration under Section 564(b)(1) of the Act, 21  U.S.C. section 360bbb-3(b)(1), unless the authorization is  terminated or revoked. Performed at [facility], [place] [address], [place], [place] [address]      Labs: BNP (last 3 results) No results for input(s): BNP in the last 8760 hours. Basic Metabolic Panel: Recent Labs  Lab 08/21/19 2353 08/23/19 0434  NA 139 143  K 3.8 3.5  CL 106 110  CO2 25 25  GLUCOSE 118* 97  BUN 25* 18  CREATININE 1.07* 0.97  CALCIUM 9.0 8.8*  MG  --  1.9   Liver Function Tests: Recent Labs  Lab 08/21/19 2353 08/23/19 0434  AST 20 18  ALT 14 13  ALKPHOS 61 48  BILITOT 0.3 0.4  PROT 6.2* 5.0*  ALBUMIN 3.4* 2.5*   No results for input(s): LIPASE, AMYLASE in the last 168 hours. No results for input(s): AMMONIA in the last 168 hours. CBC: Recent Labs    Lab 08/21/19 2353 08/21/19 2353 08/22/19 0430 08/22/19 0857 08/22/19 1309 08/22/19 1851 08/23/19 0434  WBC 11.7*  --  12.1*  --   --   --  7.1  NEUTROABS  --   --   --   --   --   --  3.0  HGB 10.8*   < > 9.2* 7.6* 8.3* 8.5* 7.8*  HCT 34.2*   < > 30.1* 24.6* 26.9* 26.9* 24.5*  MCV 99.1  --  100.7*  --   --   --  97.2  PLT 263  --  220  --   --   --  202   < > = values in this interval not displayed.   Cardiac Enzymes: No results for input(s): CKTOTAL, CKMB, CKMBINDEX, TROPONINI in the last 168 hours. BNP: Invalid input(s): POCBNP CBG: No results for input(s): GLUCAP in the last 168 hours. D-Dimer No results for input(s): DDIMER in the last 72 hours. Hgb A1c No results for input(s): HGBA1C in the last 72 hours. Lipid Profile No results for input(s): CHOL, HDL, LDLCALC, TRIG, CHOLHDL, LDLDIRECT in the last 72 hours. Thyroid function studies No results for input(s): TSH, T4TOTAL, T3FREE, THYROIDAB in the last 72 hours.  Invalid input(s): FREET3 Anemia work up Recent Labs    08/22/19 0430  TIBC 246*  IRON 49   Urinalysis No results found for: COLORURINE, APPEARANCEUR, LABSPEC, PHURINE, GLUCOSEU, HGBUR, BILIRUBINUR, KETONESUR, PROTEINUR, UROBILINOGEN, NITRITE, LEUKOCYTESUR Sepsis Labs Invalid input(s): PROCALCITONIN,  WBC,  LACTICIDVEN   Time coordinating discharge: 35 minutes  SIGNED:  Almon Hercules, MD  [facility] 08/23/2019, 11:34 AM  If 7PM-7AM, please contact night-coverage [email or web address] Password [email or web address]

## 2019-08-24 ENCOUNTER — Telehealth: Payer: Self-pay | Admitting: *Deleted

## 2019-08-24 NOTE — Telephone Encounter (Signed)
Transition Care Management Follow-up Telephone Call  Date of discharge and from where: 08/23/2019 [place]  How have you been since you were released from the hospital? Better but still tired  Any questions or concerns? No   Items Reviewed:  Did the pt receive and understand the discharge instructions provided? Yes   Medications obtained and verified? Yes   Any new allergies since your discharge? No   Dietary orders reviewed? Yes  Do you have support at home? No   Other (ie: DME, Home Health, etc) No Home Health  Functional Questionnaire: (I = Independent and D = Dependent) ADL's: I  Bathing/Dressing- I   Meal Prep- I  Eating- I  Maintaining continence- I  Transferring/Ambulation- I  Managing Meds- I   Follow up appointments reviewed:    PCP Hospital f/u appt confirmed? Yes  Scheduled to see Shanda Bumps on 4/221 @ 10.  Specialist Hospital f/u appt confirmed? No    Are transportation arrangements needed? No   If their condition worsens, is the pt aware to call  their PCP or go to the ED? Yes  Was the patient provided with contact information for the PCP's office or ED? Yes  Was the pt encouraged to call back with questions or concerns? Yes

## 2019-08-25 ENCOUNTER — Encounter: Payer: Self-pay | Admitting: *Deleted

## 2019-08-25 ENCOUNTER — Other Ambulatory Visit: Payer: Self-pay | Admitting: *Deleted

## 2019-08-25 NOTE — Patient Outreach (Signed)
[facility]) Care Management  08/25/2019  Alejandra Crane August 23, 1950 [id number]   Transition of care call/case closure   Referral received:08/23/19 Initial outreach:08/25/19 Insurance: [employer]    Subjective: Initial successful telephone call to patient's preferred number in order to complete transition of care assessment; 2 HIPAA identifiers verified. Explained purpose of call and completed transition of care assessment.  Patient states that  she is relieved to be back and home but she tired, having low energy, just resting after activity in home. 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 She reports not having a bowel movement since Monday. She denies having pain or nausea. She is aware of notifying PCP of rectal bleeding or extreme fatigue. She reports having good neighbor support as needed.   She discussed being active [place] Active health management support program due to chronic condition of hyperlipidemia, hypertension. She is also followed by [facility] weight management center.  Shedoes not have the hospital indemnity She uses a [employer] outpatient pharmacy at [place] .    Objective:  Alejandra Crane  was hospitalized at [facility]  from 4/17-4/19/21 for Acute lower GI bleed, Diverticular Comorbidities include: Hypertension , Depression , hyperlipidemia, preDiabetes  She was discharged to home on 4/19/21without the need for home health services or DME.   Assessment:  Patient voices good understanding of all discharge instructions.  See transition of care flowsheet for assessment details.   Plan:  Reviewed hospital discharge diagnosis of Acute lower GI bleed   and discharge treatment plan using hospital discharge instructions, assessing medication adherence, reviewing problems requiring provider notification, and discussing the importance of follow up with  primary  care provider and/or specialists as directed. Reviewed [place] healthy lifestyle program information to receive discounted premium for  2022  Step 1: Get annual physical Step 2: Complete your health assessment between J at [email or web address] Step 3:Identify your current health status and complete the corresponding action step between January 1, and January 05, 2020.   Using Active Health Management ActiveAdvice View website, verified that patient is an active participate in [place] Active Health Management chronic disease management program.    No ongoing care management needs identified so will close case to [facility] Care Management services and route successful outreach letter with [facility] Care Management pamphlet and 24 Hour Nurse Line Magnet to [employer] Care Management clinical pool to be mailed to patient's home address.  Thanked patient for their services to [facility].  Egbert Garibaldi, RN, BSN  [facility] Care Management,Care Management Coordinator  [phone]- Mobile [phone]- Toll Free Main Office

## 2019-08-27 ENCOUNTER — Other Ambulatory Visit: Payer: Self-pay

## 2019-08-27 ENCOUNTER — Encounter: Payer: Self-pay | Admitting: Nurse Practitioner

## 2019-08-27 ENCOUNTER — Other Ambulatory Visit: Payer: Self-pay | Admitting: Nurse Practitioner

## 2019-08-27 ENCOUNTER — Ambulatory Visit (INDEPENDENT_AMBULATORY_CARE_PROVIDER_SITE_OTHER): Payer: 59 | Admitting: Nurse Practitioner

## 2019-08-27 VITALS — BP 98/62 | HR 68 | Temp 96.9°F | Ht 63.0 in | Wt 195.0 lb

## 2019-08-27 DIAGNOSIS — I10 Essential (primary) hypertension: Secondary | ICD-10-CM

## 2019-08-27 DIAGNOSIS — E782 Mixed hyperlipidemia: Secondary | ICD-10-CM | POA: Diagnosis not present

## 2019-08-27 DIAGNOSIS — K922 Gastrointestinal hemorrhage, unspecified: Secondary | ICD-10-CM

## 2019-08-27 DIAGNOSIS — M1711 Unilateral primary osteoarthritis, right knee: Secondary | ICD-10-CM

## 2019-08-27 DIAGNOSIS — F325 Major depressive disorder, single episode, in full remission: Secondary | ICD-10-CM

## 2019-08-27 DIAGNOSIS — M8580 Other specified disorders of bone density and structure, unspecified site: Secondary | ICD-10-CM

## 2019-08-27 DIAGNOSIS — Z6835 Body mass index (BMI) 35.0-35.9, adult: Secondary | ICD-10-CM | POA: Diagnosis not present

## 2019-08-27 DIAGNOSIS — R519 Headache, unspecified: Secondary | ICD-10-CM | POA: Diagnosis not present

## 2019-08-27 DIAGNOSIS — H538 Other visual disturbances: Secondary | ICD-10-CM

## 2019-08-27 DIAGNOSIS — A6009 Herpesviral infection of other urogenital tract: Secondary | ICD-10-CM | POA: Diagnosis not present

## 2019-08-27 DIAGNOSIS — K5790 Diverticulosis of intestine, part unspecified, without perforation or abscess without bleeding: Secondary | ICD-10-CM

## 2019-08-27 DIAGNOSIS — D62 Acute posthemorrhagic anemia: Secondary | ICD-10-CM

## 2019-08-27 MED ORDER — DICLOFENAC SODIUM 1 % EX GEL: 4.0000 g | Freq: Four times a day (QID) | CUTANEOUS | 1 refills | Status: DC

## 2019-08-27 MED ORDER — VALACYCLOVIR HCL 500 MG PO TABS: 500.0000 mg | ORAL_TABLET | Freq: Every day | ORAL | 3 refills | Status: DC

## 2019-08-27 NOTE — Progress Notes (Deleted)
Careteam: Patient Care Team: Lauree Chandler, NP as PCP - General (Geriatric Medicine) Pieter Partridge, DO as Consulting Physician (Neurology)  PLACE OF SERVICE:  [facility]  Advanced Directive information    Allergies  Allergen Reactions  . Mobic [Meloxicam]     GI bleed    Chief Complaint  Patient presents with  . [facility] follow-up from 4/17-4/19, Acute GI bleed      HPI: Patient is a 69 y.o. female ***  Review of Systems:  ROS***  Past Medical History:  Diagnosis Date  . Arthritis   . Depression   . Diverticulosis    noted on colonscopy   . Elevated cholesterol   . GERD (gastroesophageal reflux disease)   . H/O mammogram    2015, 2016  . 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 High blood pressure   . History of esophagogastroduodenoscopy (EGD)    2014, 2016  . History of PFTs 10/26/2012  . Lower GI bleed 01/19/2015   Related to mobic 15 mg   . Mitral valve prolapse 09/06/1996  . Osteoarthritis of both knees    Past Surgical History:  Procedure Laterality Date  . BREAST EXCISIONAL BIOPSY Left    benign  . BREAST SURGERY  05/20/2007   [facility], Dr.William Purkert   . COLONOSCOPY     2014, 2016 Dr.Bajani Manalo, Dr.Mahmood Abedi    Social History:   reports that she has quit smoking. She has a 2.50 pack-year smoking history. She has never used smokeless tobacco. She reports current alcohol use. She reports that she does not use drugs.  Family History  Problem Relation Age of Onset  . Heart attack Mother   . CVA Mother   . Diabetes Mother   . Congestive Heart Failure Mother   . Stroke Mother   . Heart disease Mother   . Hyperlipidemia Mother   . Hypertension Mother   . Depression Mother   . Obesity Mother   . Lung cancer Sister 39  . Cancer Brother        GI or colon cancer  . Neurologic Disorder Son   . Cancer Other        Niece-colon cancer  . Diabetes Other        Nephew    Medications: Patient's Medications  New  Prescriptions   No medications on file  Previous Medications   CALCIUM CARBONATE-VITAMIN D (CALTRATE 600+D) 600-400 MG-UNIT TABLET    Take 1 tablet by mouth 2 (two) times daily.   DOCUSATE SODIUM (COLACE) 100 MG CAPSULE    Take 1 capsule (100 mg total) by mouth 2 (two) times daily.   FERROUS SULFATE 325 (65 FE) MG EC TABLET    Take 1 tablet (325 mg total) by mouth 2 (two) times daily.   HYDROCHLOROTHIAZIDE (HYDRODIURIL) 25 MG TABLET    TAKE 1 TABLET BY MOUTH DAILY.   POLYETHYLENE GLYCOL POWDER (MIRALAX) 17 GM/SCOOP POWDER    Take 17 g by mouth 2 (two) times daily as needed for moderate constipation.   ROSUVASTATIN (CRESTOR) 5 MG TABLET    Take 1 tablet (5 mg total) by mouth at bedtime.   SERTRALINE (ZOLOFT) 100 MG TABLET    TAKE 1 TABLET BY MOUTH DAILY.   VALACYCLOVIR (VALTREX) 500 MG TABLET    Take 1 tablet (500 mg total) by mouth daily.   VITAMIN D, ERGOCALCIFEROL, (DRISDOL) 1.25 MG (50000 UNIT) CAPS CAPSULE    Take 1  capsule (50,000 Units total) by mouth every 7 (seven) days.  Modified Medications   No medications on file  Discontinued Medications   No medications on file    Physical Exam:  Vitals:   08/27/19 1008  Weight: 195 lb (88.5 kg)  Height: 5\' 3"  (1.6 m)   Body mass index is 34.54 kg/m. Wt Readings from Last 3 Encounters:  08/27/19 195 lb (88.5 kg)  08/22/19 199 lb 8.3 oz (90.5 kg)  08/12/19 189 lb (85.7 kg)    Physical Exam***  Labs reviewed: Basic Metabolic Panel: Recent Labs    06/22/19 1317 08/21/19 2353 08/23/19 0434  NA 140 139 143  K 3.8 3.8 3.5  CL 102 106 110  CO2 24 25 25   GLUCOSE 88 118* 97  BUN 23 25* 18  CREATININE 0.83 1.07* 0.97  CALCIUM 9.4 9.0 8.8*  MG  --   --  1.9  TSH 2.910  --   --    Liver Function Tests: Recent Labs    06/22/19 1317 08/21/19 2353 08/23/19 0434  AST 21 20 18   ALT 10 14 13   ALKPHOS 92 61 48  BILITOT <0.2 0.3 0.4  PROT 7.4 6.2* 5.0*  ALBUMIN 4.3 3.4* 2.5*   No results for input(s): LIPASE, AMYLASE in the  last 8760 hours. No results for input(s): AMMONIA in the last 8760 hours. CBC: Recent Labs    06/22/19 1317 08/21/19 2353 08/22/19 0430 08/22/19 0857 08/22/19 1851 08/23/19 0434 08/23/19 1556  WBC 8.2   < > 12.1*  --   --  7.1 8.3  NEUTROABS 3.3  --   --   --   --  3.0 4.5  HGB 12.7   < > 9.2*   < > 8.5* 7.8* 8.7*  HCT 38.0   < > 30.1*   < > 26.9* 24.5* 27.5*  MCV 94   < > 100.7*  --   --  97.2 97.9  PLT 266   < > 220  --   --  202 239   < > = values in this interval not displayed.   Lipid Panel: Recent Labs    09/14/18 0945 03/15/19 0811 06/22/19 1317  CHOL 203* 197 215*  HDL 75 69 77  LDLCALC 114* 113* 123*  TRIG 58 68 86  CHOLHDL 2.7 2.9 2.8   TSH: Recent Labs    06/22/19 1317  TSH 2.910   A1C: Lab Results  Component Value Date   HGBA1C 5.8 (H) 06/22/2019     Assessment/Plan There are no diagnoses linked to this encounter.  Next appt: *** Larosa Rhines K. 13/09/20  [facility] [phone]

## 2019-08-27 NOTE — Patient Instructions (Addendum)
To cancel 6 month lab work and appt (done today)  Follow up in 6 months with labs prior

## 2019-08-27 NOTE — Progress Notes (Signed)
Careteam: Patient Care Team: [name], NP as PCP - General (Geriatric Medicine) Drema Dallas, DO as Consulting Physician (Neurology)  PLACE OF SERVICE:  [facility]  Advanced Directive information- pt brought in copies of her POA and Living Will   Allergies  Allergen Reactions  . Mobic [Meloxicam]     GI bleed    Chief Complaint  Patient presents with  . Transitions Of Care    Hospital follow-up from 4/17-4/19, Acute GI bleed      HPI: Patient is a 69 y.o. female following up from recent hospital visit due to Acute GI bleed. Pt states that she feels okay. Had syncopal episode at the hospital.  No more rectal bleeding.   GI bleed- Pt taking iron once per day. Still going to the bathroom, taking colace once daily as well.    Knee feels better, pt is going to PT. Pt is using TENS machine. Stated that she is not to using pensaid spray anymore due to risk of bleeding.   Headache are better. Followed through to neurologist. He recommend a MRI- she did not follow through.She thinks they are related to stress.   Rash is almost gone. Using Cerve cream and much better. She does state that she has eczema at times and this may have been that.  HTN- controlled with HCTZ. Low sodium diet. .  Weight going to weight loss center. Lost 14 lbs. Walking 3 miles per day, calorie 1300/day, has accountability. Has appointment on 4/29 with Weight loss center.  Vision has changed, has to wear glasses to see close up. Has appt 09/27/19 with Dr. Dione Booze.    Pt states that she bruises easily.  Has some depression, has took Zoloft since menopausal. Can see a difference on medication.   Ran out of Valtrex medication- used to take everyday. Requesting a prescription refill.  Covid Vaccine- has had both of them. Does not have her card. Pt is going to call with her dates on her card.   Review of Systems:  Review of Systems  Constitutional: Negative for chills and fever.  HENT: Negative  for hearing loss.   Eyes: Positive for blurred vision.  Respiratory: Negative for cough.   Cardiovascular: Negative for chest pain and palpitations.  Gastrointestinal: Negative for blood in stool, nausea and vomiting.  Genitourinary: Negative for frequency and urgency.  Musculoskeletal: Negative.   Skin: Negative for rash.  Neurological: Negative for dizziness and headaches.  Psychiatric/Behavioral: Positive for depression (not out of control but has some).    Past Medical History:  Diagnosis Date  . Arthritis   . Depression   . Diverticulosis    noted on colonscopy   . Elevated cholesterol   . GERD (gastroesophageal reflux disease)   . H/O mammogram    2015, 2016  . 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 High blood pressure   . History of esophagogastroduodenoscopy (EGD)    2014, 2016  . History of PFTs 10/26/2012  . Lower GI bleed 01/19/2015   Related to mobic 15 mg   . Mitral valve prolapse 09/06/1996  . Osteoarthritis of both knees    Past Surgical History:  Procedure Laterality Date  . BREAST EXCISIONAL BIOPSY Left    benign  . BREAST SURGERY  05/20/2007   [facility], Dr.William Purkert   . COLONOSCOPY     2014, 2016 Dr.Bajani Manalo, Dr.Mahmood Abedi    Social History:   reports that she has quit smoking. She has a  2.50 pack-year smoking history. She has never used smokeless tobacco. She reports current alcohol use. She reports that she does not use drugs.  Family History  Problem Relation Age of Onset  . Heart attack Mother   . CVA Mother   . Diabetes Mother   . Congestive Heart Failure Mother   . Stroke Mother   . Heart disease Mother   . Hyperlipidemia Mother   . Hypertension Mother   . Depression Mother   . Obesity Mother   . Lung cancer Sister 35  . Cancer Brother        GI or colon cancer  . Neurologic Disorder Son   . Cancer Other        Niece-colon cancer  . Diabetes Other        Nephew    Medications: Patient's Medications  New Prescriptions   No  medications on file  Previous Medications   CALCIUM CARBONATE-VITAMIN D (CALTRATE 600+D) 600-400 MG-UNIT TABLET    Take 1 tablet by mouth 2 (two) times daily.   DOCUSATE SODIUM (COLACE) 100 MG CAPSULE    Take 1 capsule (100 mg total) by mouth 2 (two) times daily.   FERROUS SULFATE 325 (65 FE) MG EC TABLET    Take 1 tablet (325 mg total) by mouth 2 (two) times daily.   HYDROCHLOROTHIAZIDE (HYDRODIURIL) 25 MG TABLET    TAKE 1 TABLET BY MOUTH DAILY.   POLYETHYLENE GLYCOL POWDER (MIRALAX) 17 GM/SCOOP POWDER    Take 17 g by mouth 2 (two) times daily as needed for moderate constipation.   ROSUVASTATIN (CRESTOR) 5 MG TABLET    Take 1 tablet (5 mg total) by mouth at bedtime.   SERTRALINE (ZOLOFT) 100 MG TABLET    TAKE 1 TABLET BY MOUTH DAILY.   VALACYCLOVIR (VALTREX) 500 MG TABLET    Take 1 tablet (500 mg total) by mouth daily.   VITAMIN D, ERGOCALCIFEROL, (DRISDOL) 1.25 MG (50000 UNIT) CAPS CAPSULE    Take 1 capsule (50,000 Units total) by mouth every 7 (seven) days.  Modified Medications   No medications on file  Discontinued Medications   No medications on file    Physical Exam:  Vitals:   08/27/19 1008  Weight: 195 lb (88.5 kg)  Height: 5\' 3"  (1.6 m)   Body mass index is 34.54 kg/m. Wt Readings from Last 3 Encounters:  08/27/19 195 lb (88.5 kg)  08/22/19 199 lb 8.3 oz (90.5 kg)  08/12/19 189 lb (85.7 kg)    Physical Exam Vitals and nursing note reviewed.  Constitutional:      Appearance: Normal appearance.  HENT:     Head: Normocephalic.  Eyes:     Pupils: Pupils are equal, round, and reactive to light.  Cardiovascular:     Rate and Rhythm: Normal rate and regular rhythm.     Pulses: Normal pulses.     Heart sounds: Normal heart sounds, S1 normal and S2 normal.  Pulmonary:     Effort: Pulmonary effort is normal.     Breath sounds: Normal breath sounds.  Abdominal:     General: Bowel sounds are normal.     Palpations: Abdomen is soft.  Musculoskeletal:     Cervical back:  Normal range of motion.     Right lower leg: No edema.     Left lower leg: No edema.  Skin:    General: Skin is warm and dry.  Neurological:     General: No focal deficit present.  Mental Status: She is alert and oriented to person, place, and time.  Psychiatric:        Attention and Perception: Attention and perception normal.        Mood and Affect: Mood normal.        Speech: Speech normal.        Behavior: Behavior normal.        Thought Content: Thought content normal.        Cognition and Memory: Cognition and memory normal.        Judgment: Judgment normal.     Labs reviewed: Basic Metabolic Panel: Recent Labs    06/22/19 1317 08/21/19 2353 08/23/19 0434  NA 140 139 143  K 3.8 3.8 3.5  CL 102 106 110  CO2 24 25 25   GLUCOSE 88 118* 97  BUN 23 25* 18  CREATININE 0.83 1.07* 0.97  CALCIUM 9.4 9.0 8.8*  MG  --   --  1.9  TSH 2.910  --   --    Liver Function Tests: Recent Labs    06/22/19 1317 08/21/19 2353 08/23/19 0434  AST 21 20 18   ALT 10 14 13   ALKPHOS 92 61 48  BILITOT <0.2 0.3 0.4  PROT 7.4 6.2* 5.0*  ALBUMIN 4.3 3.4* 2.5*   No results for input(s): LIPASE, AMYLASE in the last 8760 hours. No results for input(s): AMMONIA in the last 8760 hours. CBC: Recent Labs    06/22/19 1317 08/21/19 2353 08/22/19 0430 08/22/19 0857 08/22/19 1851 08/23/19 0434 08/23/19 1556  WBC 8.2   < > 12.1*  --   --  7.1 8.3  NEUTROABS 3.3  --   --   --   --  3.0 4.5  HGB 12.7   < > 9.2*   < > 8.5* 7.8* 8.7*  HCT 38.0   < > 30.1*   < > 26.9* 24.5* 27.5*  MCV 94   < > 100.7*  --   --  97.2 97.9  PLT 266   < > 220  --   --  202 239   < > = values in this interval not displayed.   Lipid Panel: Recent Labs    09/14/18 0945 03/15/19 0811 06/22/19 1317  CHOL 203* 197 215*  HDL 75 69 77  LDLCALC 114* 113* 123*  TRIG 58 68 86  CHOLHDL 2.7 2.9 2.8   TSH: Recent Labs    06/22/19 1317  TSH 2.910   A1C: Lab Results  Component Value Date   HGBA1C 5.8 (H)  06/22/2019     Assessment/Plan 1. Essential hypertension - controlled on HCTZ and dietary modifications -CBC and CMET  2. Osteopenia, unspecified location - continues on cal and vit D and encouraged weight bearing exercises.   3. Depression, major, single episode, complete remission ([place]) -Stable with some depression but managed on Zoloft.  4. Mixed hyperlipidemia - stable on crestor 5 mg daily at bedtime -CMET and lipids to be obtained today- she is fasting  5. Primary osteoarthritis of right knee - Stable, continues with PT and use of TENS machine -Reccommened Voltaren gel for knee pain. Avoid NSAIDs due to risk of bleeding.  6. Nonintractable headache, unspecified chronicity pattern, unspecified headache type -improved. Had tele visit with Neurologist. Recommended a MRI. Pt did not follow through due to cerssation in headaches. Felt like it was related to stress.    7. Body mass index (BMI) of 35.0-35.9 in adult -BMI 34.54.  -Pt has appointment with [facility] wellness center  -Encouraged healthy lifestyle and continues to work on this.   8. Acute GI bleed - resolved, on ferrous sulfate 325 daily. Pt is also taking colace to prevent constipation. -No active bleeding and did not receive PRBC transfusion in hospital. Continues with fatigue and weakness but improving slowly.  -CBC  9. 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 -Refill on Valtrex prescription provided.   10. Blurred vision, bilateral Followed by ophthalmologist.   11. Acute blood loss anemia -will follow up hgb to ensure stability  - Ambulatory referral to Gastroenterology due to diverticulitis    Next appt: 6 months Kalinda Romaniello K. Biagio Borg I personally was present during the history, physical exam and medical decision-making activities of this service and have verified that the service and findings are accurately  documented in the student's note [facility] [phone]

## 2019-08-28 LAB — CBC WITH DIFFERENTIAL/PLATELET
Absolute Monocytes: 462 cells/uL (ref 200–950)
Basophils Absolute: 41 cells/uL (ref 0–200)
Basophils Relative: 0.6 %
Eosinophils Absolute: 122 cells/uL (ref 15–500)
Eosinophils Relative: 1.8 %
HCT: 24.4 % — ABNORMAL LOW (ref 35.0–45.0)
Hemoglobin: 7.9 g/dL — ABNORMAL LOW (ref 11.7–15.5)
Lymphs Abs: 2305 cells/uL (ref 850–3900)
MCH: 31.5 pg (ref 27.0–33.0)
MCHC: 32.4 g/dL (ref 32.0–36.0)
MCV: 97.2 fL (ref 80.0–100.0)
MPV: 10.9 fL (ref 7.5–12.5)
Monocytes Relative: 6.8 %
Neutro Abs: 3869 cells/uL (ref 1500–7800)
Neutrophils Relative %: 56.9 %
Platelets: 269 10*3/uL (ref 140–400)
RBC: 2.51 10*6/uL — ABNORMAL LOW (ref 3.80–5.10)
RDW: 13.4 % (ref 11.0–15.0)
Total Lymphocyte: 33.9 %
WBC: 6.8 10*3/uL (ref 3.8–10.8)

## 2019-08-28 LAB — COMPLETE METABOLIC PANEL WITH GFR
AG Ratio: 1.6 (calc) (ref 1.0–2.5)
ALT: 11 U/L (ref 6–29)
AST: 16 U/L (ref 10–35)
Albumin: 3.6 g/dL (ref 3.6–5.1)
Alkaline phosphatase (APISO): 61 U/L (ref 37–153)
BUN: 20 mg/dL (ref 7–25)
CO2: 28 mmol/L (ref 20–32)
Calcium: 8.9 mg/dL (ref 8.6–10.4)
Chloride: 111 mmol/L — ABNORMAL HIGH (ref 98–110)
Creat: 0.89 mg/dL (ref 0.50–0.99)
GFR, Est African American: 78 mL/min/{1.73_m2} (ref >=60)
GFR, Est Non African American: 67 mL/min/{1.73_m2} (ref >=60)
Globulin: 2.2 g/dL (calc) (ref 1.9–3.7)
Glucose, Bld: 94 mg/dL (ref 65–99)
Potassium: 3.9 mmol/L (ref 3.5–5.3)
Sodium: 144 mmol/L (ref 135–146)
Total Bilirubin: 0.2 mg/dL (ref 0.2–1.2)
Total Protein: 5.8 g/dL — ABNORMAL LOW (ref 6.1–8.1)

## 2019-08-28 LAB — LIPID PANEL
Cholesterol: 142 mg/dL (ref <200)
HDL: 52 mg/dL (ref >=50)
LDL Cholesterol (Calc): 76 mg/dL (calc)
Non-HDL Cholesterol (Calc): 90 mg/dL (calc) (ref <130)
Total CHOL/HDL Ratio: 2.7 (calc) (ref <5.0)
Triglycerides: 49 mg/dL (ref <150)

## 2019-08-30 ENCOUNTER — Telehealth: Payer: Self-pay

## 2019-08-30 ENCOUNTER — Other Ambulatory Visit: Payer: Self-pay | Admitting: Nurse Practitioner

## 2019-08-30 DIAGNOSIS — D649 Anemia, unspecified: Secondary | ICD-10-CM

## 2019-08-30 NOTE — Telephone Encounter (Signed)
Hgb is down to 7.9 on labs from Friday, lets have her follow up again this Friday for a CBC to make sure she does not continue to go down. (cbc dx: anemia   Future order added for CBC dx anemia for 09/03/2019 @ 10am

## 2019-09-02 ENCOUNTER — Encounter (INDEPENDENT_AMBULATORY_CARE_PROVIDER_SITE_OTHER): Payer: Self-pay | Admitting: Physician Assistant

## 2019-09-02 ENCOUNTER — Other Ambulatory Visit: Payer: Self-pay

## 2019-09-02 ENCOUNTER — Ambulatory Visit (INDEPENDENT_AMBULATORY_CARE_PROVIDER_SITE_OTHER): Payer: 59 | Admitting: Physician Assistant

## 2019-09-02 VITALS — BP 93/66 | HR 65 | Temp 97.7°F | Ht 63.0 in | Wt 193.0 lb

## 2019-09-02 DIAGNOSIS — Z6834 Body mass index (BMI) 34.0-34.9, adult: Secondary | ICD-10-CM | POA: Diagnosis not present

## 2019-09-02 DIAGNOSIS — E7849 Other hyperlipidemia: Secondary | ICD-10-CM | POA: Diagnosis not present

## 2019-09-02 DIAGNOSIS — E669 Obesity, unspecified: Secondary | ICD-10-CM

## 2019-09-02 NOTE — Progress Notes (Signed)
Chief Complaint:   OBESITY Alejandra Crane is here to discuss her progress with her obesity treatment plan along with follow-up of her obesity related diagnoses. Alejandra Crane is keeping a food journal and adhering to recommended goals of 1200-1300 calories and 80 grams of protein and states she is following her eating plan approximately 50% of the time. Alejandra Crane states she is walking 2-3 miles 2-3 times per week.  Today's visit was #: 5 Starting weight: 203 lbs Starting date: 06/22/2019 Today's weight: 193 lbs Today's date: 09/02/2019 Total lbs lost to date: 10 Total lbs lost since last in-office visit: 0  Interim History: Alejandra Crane was recently hospitalized with diverticulosis and lower GI bleed. She continues to have intermittent nausea. She was on a liquid diet for a few days and has not been getting all of her protein in.  Subjective:   Other hyperlipidemia. Alejandra Crane is on rosuvastatin. No chest pain or myalgias.   Lab Results  Component Value Date   CHOL 142 08/27/2019   HDL 52 08/27/2019   LDLCALC 76 08/27/2019   TRIG 49 08/27/2019   CHOLHDL 2.7 08/27/2019   Lab Results  Component Value Date   ALT 11 08/27/2019   AST 16 08/27/2019   ALKPHOS 48 08/23/2019   BILITOT 0.2 08/27/2019   The 10-year ASCVD risk score Alejandra Crane DC Jr., et al., 2013) is: 3.8%   Values used to calculate the score:     Age: 69 years     Sex: Female     Is Non-Hispanic African American: Yes     Diabetic: No     Tobacco smoker: No     Systolic Blood Pressure: 93 mmHg     Is BP treated: Yes     HDL Cholesterol: 52 mg/dL     Total Cholesterol: 142 mg/dL  Assessment/Plan:   Other hyperlipidemia. Cardiovascular risk and specific lipid/LDL goals reviewed.  We discussed several lifestyle modifications today and Alejandra Crane will continue to work on diet, exercise and weight loss efforts. Orders and follow up as documented in patient record. Alejandra Crane will continue her medication as directed.  Counseling Intensive  lifestyle modifications are the first line treatment for this issue. . Dietary changes: Increase soluble fiber. Decrease simple carbohydrates. . Exercise changes: Moderate to vigorous-intensity aerobic activity 150 minutes per week if tolerated. . Lipid-lowering medications: see documented in medical record.  Class 1 obesity with serious comorbidity and body mass index (BMI) of 34.0 to 34.9 in adult, unspecified obesity type.  Alejandra Crane is currently in the action stage of change. As such, her goal is to continue with weight loss efforts. She has agreed to keeping a food journal and adhering to recommended goals of 1200-1300 calories and 80 grams of protein daily.   Exercise goals: Older adults should follow the adult guidelines. When older adults cannot meet the adult guidelines, they should be as physically active as their abilities and conditions will allow.   Behavioral modification strategies: meal planning and cooking strategies and keeping healthy foods in the home.  Alejandra Crane has agreed to follow-up with our clinic in 3 weeks. She was informed of the importance of frequent follow-up visits to maximize her success with intensive lifestyle modifications for her multiple health conditions.   Objective:   Blood pressure 93/66, pulse 65, temperature 97.7 F (36.5 C), temperature source Oral, height 5\' 3"  (1.6 m), weight 193 lb (87.5 kg), SpO2 99 %. Body mass index is 34.19 kg/m.  General: Cooperative, alert, well developed, in no acute  distress. HEENT: Conjunctivae and lids unremarkable. Cardiovascular: Regular rhythm.  Lungs: Normal work of breathing. Neurologic: No focal deficits.   Lab Results  Component Value Date   CREATININE 0.89 08/27/2019   BUN 20 08/27/2019   NA 144 08/27/2019   K 3.9 08/27/2019   CL 111 (H) 08/27/2019   CO2 28 08/27/2019   Lab Results  Component Value Date   ALT 11 08/27/2019   AST 16 08/27/2019   ALKPHOS 48 08/23/2019   BILITOT 0.2 08/27/2019   Lab  Results  Component Value Date   HGBA1C 5.8 (H) 06/22/2019   HGBA1C 5.6 03/15/2019   HGBA1C 5.6 03/16/2018   HGBA1C 5.6 09/19/2017   HGBA1C 5.6 12/25/2015   Lab Results  Component Value Date   INSULIN 7.1 06/22/2019   Lab Results  Component Value Date   TSH 2.910 06/22/2019   Lab Results  Component Value Date   CHOL 142 08/27/2019   HDL 52 08/27/2019   LDLCALC 76 08/27/2019   TRIG 49 08/27/2019   CHOLHDL 2.7 08/27/2019   Lab Results  Component Value Date   WBC 6.8 08/27/2019   HGB 7.9 (L) 08/27/2019   HCT 24.4 (L) 08/27/2019   MCV 97.2 08/27/2019   PLT 269 08/27/2019   Lab Results  Component Value Date   IRON 49 08/22/2019   TIBC 246 (L) 08/22/2019   FERRITIN 77 06/22/2019   Attestation Statements:   Reviewed by clinician on day of visit: allergies, medications, problem list, medical history, surgical history, family history, social history, and previous encounter notes.  Time spent on visit including pre-visit chart review and post-visit charting and care was 30 minutes.   IMarianna Payment, am acting as transcriptionist for Alois Cliche, PA-C   I have reviewed the above documentation for accuracy and completeness, and I agree with the above. Alois Cliche, PA-C

## 2019-09-03 ENCOUNTER — Other Ambulatory Visit: Payer: 59

## 2019-09-03 ENCOUNTER — Other Ambulatory Visit: Payer: Self-pay

## 2019-09-03 DIAGNOSIS — D649 Anemia, unspecified: Secondary | ICD-10-CM

## 2019-09-03 LAB — CBC WITH DIFFERENTIAL/PLATELET
Absolute Monocytes: 638 cells/uL (ref 200–950)
Basophils Absolute: 43 cells/uL (ref 0–200)
Basophils Relative: 0.5 %
Eosinophils Absolute: 162 cells/uL (ref 15–500)
Eosinophils Relative: 1.9 %
HCT: 26.7 % — ABNORMAL LOW (ref 35.0–45.0)
Hemoglobin: 8.6 g/dL — ABNORMAL LOW (ref 11.7–15.5)
Lymphs Abs: 3290 cells/uL (ref 850–3900)
MCH: 32.1 pg (ref 27.0–33.0)
MCHC: 32.2 g/dL (ref 32.0–36.0)
MCV: 99.6 fL (ref 80.0–100.0)
MPV: 10.3 fL (ref 7.5–12.5)
Monocytes Relative: 7.5 %
Neutro Abs: 4369 cells/uL (ref 1500–7800)
Neutrophils Relative %: 51.4 %
Platelets: 368 10*3/uL (ref 140–400)
RBC: 2.68 10*6/uL — ABNORMAL LOW (ref 3.80–5.10)
RDW: 14.1 % (ref 11.0–15.0)
Total Lymphocyte: 38.7 %
WBC: 8.5 10*3/uL (ref 3.8–10.8)

## 2019-09-09 ENCOUNTER — Ambulatory Visit: Payer: 59 | Admitting: Gastroenterology

## 2019-09-16 ENCOUNTER — Encounter: Payer: Self-pay | Admitting: Gastroenterology

## 2019-09-16 ENCOUNTER — Ambulatory Visit: Payer: 59 | Admitting: Gastroenterology

## 2019-09-16 VITALS — BP 104/72 | HR 72 | Temp 97.8°F | Ht 62.0 in | Wt 188.1 lb

## 2019-09-16 DIAGNOSIS — Z8719 Personal history of other diseases of the digestive system: Secondary | ICD-10-CM | POA: Diagnosis not present

## 2019-09-16 NOTE — Progress Notes (Signed)
09/16/2019 Alejandra Crane [id number] 1950/12/12   HISTORY OF PRESENT ILLNESS: This is a 69 year old female who is establishing care with Dr. Lyndel Safe.  She was just seen by him during recent hospital stay.  Was admitted from April 17 to April 19 for lower GI bleed thought to be diverticular in origin.  She had a colonoscopy in 01/2013 that showed diverticulosis and small internal hemorrhoids.  She had an EGD at that time as well that showed duodenal polypoid "lump".  Biopsies were taken but we do not have pathology reports.  Then in September 2016 she had a lower GI bleed presumed to be diverticular in origin, but they did repeat colonoscopy at that time and found moderately severe diverticulosis throughout the entire colon as well as hemorrhoids.  She had an EGD repeated at that time as well that was normal except for a polyp found in the distal duodenal bulb.  Once again no pathology available.  Nonetheless, her GI bleeding resolved during her recent hospitalization.  She did not require blood transfusions.  Her hemoglobin at the time of discharge was 8.7 g.  Then a few days later it did down to 7.9 g and then on April 30 is is back up to 8.6 g.  She denies any further sign of GI bleeding.  Says she is moving her bowels well without issues.  No complaints today.  She says that after her first GI bleed in September 2016 that it took 6 months to a year for her hemoglobin to return to normal levels.  Her baseline tends to be red around 12 g range.  I am sending copies of the previous procedure notes to be scanned into our system.   Past Medical History:  Diagnosis Date  . Arthritis   . Depression   . Diverticulosis    noted on colonscopy   . Elevated cholesterol   . GERD (gastroesophageal reflux disease)   . H/O mammogram    2015, 2016  . 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 High blood pressure   . History of esophagogastroduodenoscopy (EGD)    2014, 2016  . History of PFTs 10/26/2012  . Lower  GI bleed 01/19/2015   Related to mobic 15 mg   . Mitral valve prolapse 09/06/1996  . Osteoarthritis of both knees    Past Surgical History:  Procedure Laterality Date  . BREAST EXCISIONAL BIOPSY Left    benign  . BREAST SURGERY  05/20/2007   [facility], Dr.William Purkert   . COLONOSCOPY     2014, 2016 Dr.Bajani Manalo, [place]     reports that she has quit smoking. She has a 2.50 pack-year smoking history. She has never used smokeless tobacco. She reports current alcohol use. She reports that she does not use drugs. family history includes CVA in her mother; Cancer in her brother and another family member; Congestive Heart Failure in her mother; Depression in her mother; Diabetes in her mother and another family member; Heart attack in her mother; Heart disease in her mother; Hyperlipidemia in her mother; Hypertension in her mother; Lung cancer (age of onset: 46) in her sister; Neurologic Disorder in her son; Obesity in her mother; Stroke in her mother. Allergies  Allergen Reactions  . Mobic [Meloxicam]     GI bleed      Outpatient Encounter Medications as of 09/16/2019  Medication Sig  . Calcium Carbonate-Vitamin D 600-400 MG-UNIT tablet Take 1 tablet by mouth daily.  . diclofenac Sodium (VOLTAREN)  1 % GEL Apply 4 g topically 4 (four) times daily.  Marland Kitchen docusate sodium (COLACE) 100 MG capsule Take 100 mg by mouth daily.  . ferrous sulfate 325 (65 FE) MG EC tablet Take 325 mg by mouth daily with breakfast.  . hydrochlorothiazide (HYDRODIURIL) 25 MG tablet TAKE 1 TABLET BY MOUTH DAILY.  . rosuvastatin (CRESTOR) 5 MG tablet Take 1 tablet (5 mg total) by mouth at bedtime.  . sertraline (ZOLOFT) 100 MG tablet TAKE 1 TABLET BY MOUTH DAILY.  . valACYclovir (VALTREX) 500 MG tablet Take 1 tablet (500 mg total) by mouth daily.  . polyethylene glycol powder (MIRALAX) 17 GM/SCOOP powder Take 17 g by mouth 2 (two) times daily as needed for moderate constipation. (Patient not taking: Reported  on 09/16/2019)  . [DISCONTINUED] Vitamin D, Ergocalciferol, (DRISDOL) 1.25 MG (50000 UNIT) CAPS capsule Take 1 capsule (50,000 Units total) by mouth every 7 (seven) days.   No facility-administered encounter medications on file as of 09/16/2019.     REVIEW OF SYSTEMS  : All other systems reviewed and negative except where noted in the History of Present Illness.   PHYSICAL EXAM: Temp 97.8 F (36.6 C)   Ht 5\' 2"  (1.575 m) Comment: height measured without shoes  Wt 188 lb 2 oz (85.3 kg)   BMI 34.41 kg/m  General: Well developed AA female in no acute distress Head: Normocephalic and atraumatic Eyes:  Sclerae anicteric, conjunctiva pink. Ears: Normal auditory acuity Lungs: Clear throughout to auscultation; no increased WOB. Heart: Regular rate and rhythm; no M/R/G. Abdomen: Soft, non-distended.  BS present.  Non-tender. Musculoskeletal: Symmetrical with no gross deformities  Skin: No lesions on visible extremities Extremities: No edema  Neurological: Alert oriented x 4, grossly non-focal Psychological:  Alert and cooperative. Normal mood and affect  ASSESSMENT AND PLAN: *LGI bleed, diverticular.  Here for hospital follow-up.  Negative colonoscopy 2014 and September 2016 except for pandiverticular disease.  No further bleeding. *Acute blood loss anemia: Hgb stable when last checked 2 weeks ago.  She is on ferrous sulfate 325 mg daily.  I asked her about rechecking her hemoglobin today and, but she prefers to wait and will have her PCP check it in the near future.  **Patient will follow up here as needed otherwise we will enter colonoscopy recall for September 2026 with Dr. October 2026.   CC:  Chales Abrahams, NP

## 2019-09-16 NOTE — Patient Instructions (Signed)
If you are age 68 or older, your body mass index should be between 23-30. Your Body mass index is 34.41 kg/m. If this is out of the aforementioned range listed, please consider follow up with your Primary Care Provider.  If you are age 47 or younger, your body mass index should be between 19-25. Your Body mass index is 34.41 kg/m. If this is out of the aformentioned range listed, please consider follow up with your Primary Care Provider.   Follow up as needed.  Thank you for choosing me and [facility].   Doug Sou, PA-C

## 2019-09-23 ENCOUNTER — Other Ambulatory Visit: Payer: Self-pay

## 2019-09-23 ENCOUNTER — Other Ambulatory Visit: Payer: 59

## 2019-09-23 ENCOUNTER — Encounter (INDEPENDENT_AMBULATORY_CARE_PROVIDER_SITE_OTHER): Payer: Self-pay | Admitting: Physician Assistant

## 2019-09-23 ENCOUNTER — Ambulatory Visit (INDEPENDENT_AMBULATORY_CARE_PROVIDER_SITE_OTHER): Payer: 59 | Admitting: Physician Assistant

## 2019-09-23 VITALS — BP 113/73 | HR 55 | Temp 97.8°F | Ht 62.0 in | Wt 191.0 lb

## 2019-09-23 DIAGNOSIS — Z6833 Body mass index (BMI) 33.0-33.9, adult: Secondary | ICD-10-CM

## 2019-09-23 DIAGNOSIS — E559 Vitamin D deficiency, unspecified: Secondary | ICD-10-CM

## 2019-09-23 DIAGNOSIS — R7303 Prediabetes: Secondary | ICD-10-CM | POA: Diagnosis not present

## 2019-09-23 DIAGNOSIS — E669 Obesity, unspecified: Secondary | ICD-10-CM

## 2019-09-23 DIAGNOSIS — Z9189 Other specified personal risk factors, not elsewhere classified: Secondary | ICD-10-CM

## 2019-09-23 MED ORDER — VITAMIN D (ERGOCALCIFEROL) 1.25 MG (50000 UNIT) PO CAPS: 50000.0000 [IU] | ORAL_CAPSULE | ORAL | 0 refills | Status: DC

## 2019-09-23 MED FILL — VIT D2 1.25 MG (50,000 UNIT: 1.25 MG | 28 days supply | Qty: 4 | Fill #0

## 2019-09-23 NOTE — Progress Notes (Signed)
Chief Complaint:   OBESITY Alejandra Crane is here to discuss her progress with her obesity treatment plan along with follow-up of her obesity related diagnoses. Alejandra Crane is on keeping a food journal and adhering to recommended goals of 1200-1300 calories and 80 grams of protein daily and states she is following her eating plan approximately 80% of the time. Alejandra Crane states she is walking for 60 minutes 2 times per week.  Today's visit was #: 6 Starting weight: 203 lbs Starting date: 06/22/2019 Today's weight: 191 lbs Today's date: 09/23/2019 Total lbs lost to date: 12 Total lbs lost since last in-office visit: 2  Interim History: Alejandra Crane states that she has been skipping meals in hopes of losing weight. She is not getting enough protein or calories in most days.  Subjective:   1. Pre-diabetes Alejandra Crane is not currently on any medications, and she denies hypoglycemia.  2. Vitamin Crane deficiency Alejandra Crane denies nausea, vomiting, or muscle weakness. She is not currently taking Vit Crane.  3. At risk for diabetes mellitus Alejandra Crane is at higher than average risk for developing diabetes due to her obesity.   Assessment/Plan:   1. Pre-diabetes Alejandra Crane will continue to work on weight loss, exercise, and decreasing simple carbohydrates to help decrease the risk of diabetes.    2. Vitamin Crane deficiency Alejandra Crane level contributes to fatigue and are associated with obesity, breast, and colon cancer. We will refill prescription Vitamin Crane for 1 month. Alejandra Crane will follow-up for routine testing of Vitamin Crane, at least 2-3 times per year to avoid over-replacement.  - Vitamin Crane, Ergocalciferol, (DRISDOL) 1.25 MG (50000 UNIT) CAPS capsule; Take 1 capsule (50,000 Units total) by mouth every 7 (seven) days.  Dispense: 4 capsule; Refill: 0  3. At risk for diabetes mellitus Alejandra Crane was given approximately 15 minutes of diabetes education and counseling today. We discussed intensive lifestyle modifications today with an  emphasis on weight loss as well as increasing exercise and decreasing simple carbohydrates in her diet. We also reviewed medication options with an emphasis on risk versus benefit of those discussed.   Repetitive spaced learning was employed today to elicit superior memory formation and behavioral change.  4. Class 1 obesity with serious comorbidity and body mass index (BMI) of 33.0 to 33.9 in adult, unspecified obesity type Alejandra Crane is currently in the action stage of change. As such, her goal is to continue with weight loss efforts. She has agreed to keeping a food journal and adhering to recommended goals of 1200 calories and 80 grams of protein daily and the [place].   Exercise goals: As is.  Behavioral modification strategies: increasing lean protein intake and no skipping meals.  Alejandra Crane has agreed to follow-up with our clinic in 4 to 5 weeks. She was informed of the importance of frequent follow-up visits to maximize her success with intensive lifestyle modifications for her multiple health conditions.   Objective:   Blood pressure 113/73, pulse (!) 55, temperature 97.8 F (36.6 C), temperature source Oral, height 5\' 2"  (1.575 m), weight 191 lb (86.6 kg), SpO2 99 %. Body mass index is 34.93 kg/m.  General: Cooperative, alert, well developed, in no acute distress. HEENT: Conjunctivae and lids unremarkable. Cardiovascular: Regular rhythm.  Lungs: Normal work of breathing. Neurologic: No focal deficits.   Lab Results  Component Value Date   CREATININE 0.89 08/27/2019   BUN 20 08/27/2019   NA 144 08/27/2019   K 3.9 08/27/2019   CL 111 (H) 08/27/2019   CO2  28 08/27/2019   Lab Results  Component Value Date   ALT 11 08/27/2019   AST 16 08/27/2019   ALKPHOS 48 08/23/2019   BILITOT 0.2 08/27/2019   Lab Results  Component Value Date   HGBA1C 5.8 (H) 06/22/2019   HGBA1C 5.6 03/15/2019   HGBA1C 5.6 03/16/2018   HGBA1C 5.6 09/19/2017   HGBA1C 5.6 12/25/2015   Lab  Results  Component Value Date   INSULIN 7.1 06/22/2019   Lab Results  Component Value Date   TSH 2.910 06/22/2019   Lab Results  Component Value Date   CHOL 142 08/27/2019   HDL 52 08/27/2019   LDLCALC 76 08/27/2019   TRIG 49 08/27/2019   CHOLHDL 2.7 08/27/2019   Lab Results  Component Value Date   WBC 8.5 09/03/2019   HGB 8.6 (L) 09/03/2019   HCT 26.7 (L) 09/03/2019   MCV 99.6 09/03/2019   PLT 368 09/03/2019   Lab Results  Component Value Date   IRON 49 08/22/2019   TIBC 246 (L) 08/22/2019   FERRITIN 77 06/22/2019   Attestation Statements:   Reviewed by clinician on day of visit: allergies, medications, problem list, medical history, surgical history, family history, social history, and previous encounter notes.   Trude Mcburney, am acting as transcriptionist for [employer], PA-C.  I have reviewed the above documentation for accuracy and completeness, and I agree with the above. Alois Cliche, PA-C

## 2019-09-27 ENCOUNTER — Ambulatory Visit: Payer: 59 | Admitting: Nurse Practitioner

## 2019-10-07 DIAGNOSIS — H43812 Vitreous degeneration, left eye: Secondary | ICD-10-CM | POA: Diagnosis not present

## 2019-10-07 DIAGNOSIS — H0102B Squamous blepharitis left eye, upper and lower eyelids: Secondary | ICD-10-CM | POA: Diagnosis not present

## 2019-10-07 DIAGNOSIS — H2513 Age-related nuclear cataract, bilateral: Secondary | ICD-10-CM | POA: Diagnosis not present

## 2019-10-07 DIAGNOSIS — H35413 Lattice degeneration of retina, bilateral: Secondary | ICD-10-CM | POA: Diagnosis not present

## 2019-10-07 DIAGNOSIS — H16223 Keratoconjunctivitis sicca, not specified as Sjogren's, bilateral: Secondary | ICD-10-CM | POA: Diagnosis not present

## 2019-10-07 DIAGNOSIS — H1045 Other chronic allergic conjunctivitis: Secondary | ICD-10-CM | POA: Diagnosis not present

## 2019-10-07 DIAGNOSIS — H40013 Open angle with borderline findings, low risk, bilateral: Secondary | ICD-10-CM | POA: Diagnosis not present

## 2019-10-07 DIAGNOSIS — H0102A Squamous blepharitis right eye, upper and lower eyelids: Secondary | ICD-10-CM | POA: Diagnosis not present

## 2019-10-28 ENCOUNTER — Ambulatory Visit (INDEPENDENT_AMBULATORY_CARE_PROVIDER_SITE_OTHER): Payer: Medicare Other | Admitting: Physician Assistant

## 2019-11-04 ENCOUNTER — Encounter (INDEPENDENT_AMBULATORY_CARE_PROVIDER_SITE_OTHER): Payer: Self-pay | Admitting: Physician Assistant

## 2019-11-04 ENCOUNTER — Other Ambulatory Visit: Payer: Self-pay

## 2019-11-04 ENCOUNTER — Ambulatory Visit (INDEPENDENT_AMBULATORY_CARE_PROVIDER_SITE_OTHER): Payer: 59 | Admitting: Physician Assistant

## 2019-11-04 VITALS — BP 114/69 | HR 59 | Temp 98.6°F | Ht 62.0 in | Wt 185.0 lb

## 2019-11-04 DIAGNOSIS — E559 Vitamin D deficiency, unspecified: Secondary | ICD-10-CM | POA: Diagnosis not present

## 2019-11-04 DIAGNOSIS — E669 Obesity, unspecified: Secondary | ICD-10-CM

## 2019-11-04 DIAGNOSIS — E7849 Other hyperlipidemia: Secondary | ICD-10-CM

## 2019-11-04 DIAGNOSIS — E66811 Obesity, class 1: Secondary | ICD-10-CM

## 2019-11-04 DIAGNOSIS — Z6833 Body mass index (BMI) 33.0-33.9, adult: Secondary | ICD-10-CM

## 2019-11-04 NOTE — Progress Notes (Signed)
Chief Complaint:   OBESITY Alejandra Crane is here to discuss her progress with her obesity treatment plan along with follow-up of her obesity related diagnoses. Alejandra Crane is keeping a food journal and adhering to recommended goals of 1200 calories and 80 grams of protein and states she is following her eating plan approximately 75% of the time. Alejandra Crane states she is walking/elliptical 20 minutes 2-3 times per week.  Today's visit was #: 7 Starting weight: 203 lbs Starting date: 06/22/2019 Today's weight: 185 lbs Today's date: 11/04/2019 Total lbs lost to date: 17 Total lbs lost since last in-office visit: 6  Interim History: Alejandra Crane did a good job with being mindful of her choices and portion controlling. She is not consistently journaling. The days she did journal, she did not reach her protein goal.  Subjective:   Other hyperlipidemia. Kendrea is on Crestor. No chest pain or myalgias.    Lab Results  Component Value Date   CHOL 142 08/27/2019   HDL 52 08/27/2019   LDLCALC 76 08/27/2019   TRIG 49 08/27/2019   CHOLHDL 2.7 08/27/2019   Lab Results  Component Value Date   ALT 11 08/27/2019   AST 16 08/27/2019   ALKPHOS 48 08/23/2019   BILITOT 0.2 08/27/2019   The 10-year ASCVD risk score Denman George DC Jr., et al., 2013) is: 5.9%   Values used to calculate the score:     Age: 69 years     Sex: Female     Is Non-Hispanic African American: Yes     Diabetic: No     Tobacco smoker: No     Systolic Blood Pressure: 114 mmHg     Is BP treated: Yes     HDL Cholesterol: 52 mg/dL     Total Cholesterol: 142 mg/dL  Vitamin D deficiency. Alejandra Crane is on Vitamin D supplementation. No nausea, vomiting, or muscle weakness.    Ref. Range 06/22/2019 13:17  Vitamin D, 25-Hydroxy Latest Ref Range: 30.0 - 100.0 ng/mL 25.4 (L)   Assessment/Plan:   Other hyperlipidemia. Cardiovascular risk and specific lipid/LDL goals reviewed.  We discussed several lifestyle modifications today and Shanetta will  continue to work on diet, exercise and weight loss efforts. Orders and follow up as documented in patient record. Rhilynn will continue her Crestor as directed.  Counseling Intensive lifestyle modifications are the first line treatment for this issue. . Dietary changes: Increase soluble fiber. Decrease simple carbohydrates. . Exercise changes: Moderate to vigorous-intensity aerobic activity 150 minutes per week if tolerated.  . Lipid-lowering medications: see documented in medical record.  Vitamin D deficiency. Low Vitamin D level contributes to fatigue and are associated with obesity, breast, and colon cancer. She agrees to continue to take Vitamin D as directed and will follow-up for routine testing of Vitamin D, at least 2-3 times per year to avoid over-replacement.  Class 1 obesity with serious comorbidity and body mass index (BMI) of 33.0 to 33.9 in adult, unspecified obesity type.  Alejandra Crane is currently in the action stage of change. As such, her goal is to continue with weight loss efforts. She has agreed to keeping a food journal and adhering to recommended goals of 1200 calories and 80 grams of protein daily.   Exercise goals: Older adults should follow the adult guidelines. When older adults cannot meet the adult guidelines, they should be as physically active as their abilities and conditions will allow.   Behavioral modification strategies: increasing lean protein intake and keeping healthy foods in the home.  Alejandra Crane has agreed to follow-up with our clinic in 4 weeks. She was informed of the importance of frequent follow-up visits to maximize her success with intensive lifestyle modifications for her multiple health conditions.   Objective:   Blood pressure 114/69, pulse (!) 59, temperature 98.6 F (37 C), temperature source Oral, height 5\' 2"  (1.575 m), weight 185 lb (83.9 kg), SpO2 96 %. Body mass index is 33.84 kg/m.  General: Cooperative, alert, well developed, in no acute  distress. HEENT: Conjunctivae and lids unremarkable. Cardiovascular: Regular rhythm.  Lungs: Normal work of breathing. Neurologic: No focal deficits.   Lab Results  Component Value Date   CREATININE 0.89 08/27/2019   BUN 20 08/27/2019   NA 144 08/27/2019   K 3.9 08/27/2019   CL 111 (H) 08/27/2019   CO2 28 08/27/2019   Lab Results  Component Value Date   ALT 11 08/27/2019   AST 16 08/27/2019   ALKPHOS 48 08/23/2019   BILITOT 0.2 08/27/2019   Lab Results  Component Value Date   HGBA1C 5.8 (H) 06/22/2019   HGBA1C 5.6 03/15/2019   HGBA1C 5.6 03/16/2018   HGBA1C 5.6 09/19/2017   HGBA1C 5.6 12/25/2015   Lab Results  Component Value Date   INSULIN 7.1 06/22/2019   Lab Results  Component Value Date   TSH 2.910 06/22/2019   Lab Results  Component Value Date   CHOL 142 08/27/2019   HDL 52 08/27/2019   LDLCALC 76 08/27/2019   TRIG 49 08/27/2019   CHOLHDL 2.7 08/27/2019   Lab Results  Component Value Date   WBC 8.5 09/03/2019   HGB 8.6 (L) 09/03/2019   HCT 26.7 (L) 09/03/2019   MCV 99.6 09/03/2019   PLT 368 09/03/2019   Lab Results  Component Value Date   IRON 49 08/22/2019   TIBC 246 (L) 08/22/2019   FERRITIN 77 06/22/2019   Obesity Behavioral Intervention Documentation for Insurance:   Approximately 15 minutes were spent on the discussion below.  ASK: We discussed the diagnosis of obesity with 06/24/2019 today and Krystl agreed to give Alejandra Crane permission to discuss obesity behavioral modification therapy today.  ASSESS: Taylon has the diagnosis of obesity and her BMI today is 33.9. Briellah is in the action stage of change.   ADVISE: Vaudine was educated on the multiple health risks of obesity as well as the benefit of weight loss to improve her health. She was advised of the need for long term treatment and the importance of lifestyle modifications to improve her current health and to decrease her risk of future health problems.  AGREE: Multiple dietary  modification options and treatment options were discussed and Analynn agreed to follow the recommendations documented in the above note.  ARRANGE: Elliyah was educated on the importance of frequent visits to treat obesity as outlined per CMS and USPSTF guidelines and agreed to schedule her next follow up appointment today.  Attestation Statements:   Reviewed by clinician on day of visit: allergies, medications, problem list, medical history, surgical history, family history, social history, and previous encounter notes.  IRanda Crane, am acting as transcriptionist for [employer], PA-C   I have reviewed the above documentation for accuracy and completeness, and I agree with the above. Alois Cliche, PA-C

## 2019-11-30 ENCOUNTER — Encounter (INDEPENDENT_AMBULATORY_CARE_PROVIDER_SITE_OTHER): Payer: Self-pay | Admitting: Physician Assistant

## 2019-11-30 ENCOUNTER — Other Ambulatory Visit: Payer: Self-pay

## 2019-11-30 ENCOUNTER — Ambulatory Visit (INDEPENDENT_AMBULATORY_CARE_PROVIDER_SITE_OTHER): Payer: 59 | Admitting: Physician Assistant

## 2019-11-30 VITALS — BP 112/71 | HR 61 | Temp 98.6°F | Ht 62.0 in | Wt 186.0 lb

## 2019-11-30 DIAGNOSIS — R7303 Prediabetes: Secondary | ICD-10-CM | POA: Diagnosis not present

## 2019-11-30 DIAGNOSIS — E669 Obesity, unspecified: Secondary | ICD-10-CM | POA: Diagnosis not present

## 2019-11-30 DIAGNOSIS — Z6834 Body mass index (BMI) 34.0-34.9, adult: Secondary | ICD-10-CM | POA: Diagnosis not present

## 2019-11-30 NOTE — Progress Notes (Signed)
Chief Complaint:   OBESITY Alejandra Crane is here to discuss her progress with her obesity treatment plan along with follow-up of her obesity related diagnoses. Alejandra Crane is on the Category 2 Plan and journaling and states she is following her eating plan approximately 10% of the time. Alejandra Crane states she is going the gym 30 minutes 2-3 times per week.  Today's visit was #: 8 Starting weight: 203 lbs Starting date: 06/22/2019 Today's weight: 186 lbs Today's date: 11/30/2019 Total lbs lost to date: 17 Total lbs lost since last in-office visit: 0  Interim History: Alejandra Crane states that she has been "slacking off" on the plan and has not been journaling.  Subjective:   Prediabetes. Alejandra Crane has a diagnosis of prediabetes based on her elevated HgA1c and was informed this puts her at greater risk of developing diabetes. She continues to work on diet and exercise to decrease her risk of diabetes. She denies nausea or hypoglycemia. Alejandra Crane is on no medication. She denies polyphagia.  Lab Results  Component Value Date   HGBA1C 5.8 (H) 06/22/2019   Lab Results  Component Value Date   INSULIN 7.1 06/22/2019   Assessment/Plan:   Prediabetes. Alejandra Crane will continue to work on weight loss, exercise, and decreasing simple carbohydrates to help decrease the risk of diabetes.   Class 1 obesity with serious comorbidity and body mass index (BMI) of 34.0 to 34.9 in adult, unspecified obesity type.  Alejandra Crane is currently in the action stage of change. As such, her goal is to continue with weight loss efforts. She has agreed to keeping a food journal and adhering to recommended goals of 1200 calories and 85 grams of protein daily.   Exercise goals: Older adults should follow the adult guidelines. When older adults cannot meet the adult guidelines, they should be as physically active as their abilities and conditions will allow.   Behavioral modification strategies: meal planning and cooking strategies and  keeping healthy foods in the home.  Alejandra Crane has agreed to follow-up with our clinic in 3 weeks. She was informed of the importance of frequent follow-up visits to maximize her success with intensive lifestyle modifications for her multiple health conditions.   Objective:   Blood pressure 112/71, pulse 61, temperature 98.6 F (37 C), temperature source Oral, height 5\' 2"  (1.575 m), weight 186 lb (84.4 kg), SpO2 98 %. Body mass index is 34.02 kg/m.  General: Cooperative, alert, well developed, in no acute distress. HEENT: Conjunctivae and lids unremarkable. Cardiovascular: Regular rhythm.  Lungs: Normal work of breathing. Neurologic: No focal deficits.   Lab Results  Component Value Date   CREATININE 0.89 08/27/2019   BUN 20 08/27/2019   NA 144 08/27/2019   K 3.9 08/27/2019   CL 111 (H) 08/27/2019   CO2 28 08/27/2019   Lab Results  Component Value Date   ALT 11 08/27/2019   AST 16 08/27/2019   ALKPHOS 48 08/23/2019   BILITOT 0.2 08/27/2019   Lab Results  Component Value Date   HGBA1C 5.8 (H) 06/22/2019   HGBA1C 5.6 03/15/2019   HGBA1C 5.6 03/16/2018   HGBA1C 5.6 09/19/2017   HGBA1C 5.6 12/25/2015   Lab Results  Component Value Date   INSULIN 7.1 06/22/2019   Lab Results  Component Value Date   TSH 2.910 06/22/2019   Lab Results  Component Value Date   CHOL 142 08/27/2019   HDL 52 08/27/2019   LDLCALC 76 08/27/2019   TRIG 49 08/27/2019   CHOLHDL 2.7 08/27/2019  Lab Results  Component Value Date   WBC 8.5 09/03/2019   HGB 8.6 (L) 09/03/2019   HCT 26.7 (L) 09/03/2019   MCV 99.6 09/03/2019   PLT 368 09/03/2019   Lab Results  Component Value Date   IRON 49 08/22/2019   TIBC 246 (L) 08/22/2019   FERRITIN 77 06/22/2019   Attestation Statements:   Reviewed by clinician on day of visit: allergies, medications, problem list, medical history, surgical history, family history, social history, and previous encounter notes.  Time spent on visit including  pre-visit chart review and post-visit charting and care was 30 minutes.   IMarianna Payment, am acting as transcriptionist for Alois Cliche, PA-C   I have reviewed the above documentation for accuracy and completeness, and I agree with the above. Alois Cliche, PA-C

## 2019-12-22 ENCOUNTER — Other Ambulatory Visit: Payer: Self-pay

## 2019-12-22 ENCOUNTER — Encounter (INDEPENDENT_AMBULATORY_CARE_PROVIDER_SITE_OTHER): Payer: Self-pay | Admitting: Physician Assistant

## 2019-12-22 ENCOUNTER — Ambulatory Visit (INDEPENDENT_AMBULATORY_CARE_PROVIDER_SITE_OTHER): Payer: 59 | Admitting: Physician Assistant

## 2019-12-22 VITALS — BP 100/68 | HR 63 | Temp 98.1°F | Ht 62.0 in | Wt 180.0 lb

## 2019-12-22 DIAGNOSIS — I10 Essential (primary) hypertension: Secondary | ICD-10-CM | POA: Diagnosis not present

## 2019-12-22 DIAGNOSIS — E7849 Other hyperlipidemia: Secondary | ICD-10-CM | POA: Diagnosis not present

## 2019-12-22 DIAGNOSIS — Z6833 Body mass index (BMI) 33.0-33.9, adult: Secondary | ICD-10-CM

## 2019-12-22 DIAGNOSIS — E669 Obesity, unspecified: Secondary | ICD-10-CM

## 2019-12-23 NOTE — Progress Notes (Signed)
Chief Complaint:   OBESITY Alejandra Crane is here to discuss her progress with her obesity treatment plan along with follow-up of her obesity related diagnoses. Alejandra Crane is on the Category 2 Plan and journaling 1200 calories and 80 grams of protein and states she is following her eating plan approximately 90% of the time. Alejandra Crane states she is exercising 0 minutes 0 times per week.  Today's visit was #: 9 Starting weight: 203 lbs Starting date: 06/22/2019 Today's weight: 180 lbs Today's date: 12/22/2019 Total lbs lost to date: 23 Total lbs lost since last in-office visit: 6  Interim History: Alejandra Crane did very well with weight loss the last 3 weeks. She has been tracking her food regularly and reports she is not reaching her protein goal daily.  Subjective:   Other hyperlipidemia. Alejandra Crane is on Crestor, which she is tolerating well.  Lab Results  Component Value Date   CHOL 142 08/27/2019   HDL 52 08/27/2019   LDLCALC 76 08/27/2019   TRIG 49 08/27/2019   CHOLHDL 2.7 08/27/2019   Lab Results  Component Value Date   ALT 11 08/27/2019   AST 16 08/27/2019   ALKPHOS 48 08/23/2019   BILITOT 0.2 08/27/2019   The 10-year ASCVD risk score Denman George DC Jr., et al., 2013) is: 4.4%   Values used to calculate the score:     Age: 2 years     Sex: Female     Is Non-Hispanic African American: Yes     Diabetic: No     Tobacco smoker: No     Systolic Blood Pressure: 100 mmHg     Is BP treated: Yes     HDL Cholesterol: 52 mg/dL     Total Cholesterol: 142 mg/dL  Essential hypertension. Alejandra Crane is on HCTZ. Blood pressure is controlled.  BP Readings from Last 3 Encounters:  12/22/19 100/68  11/30/19 112/71  11/04/19 114/69   Lab Results  Component Value Date   CREATININE 0.89 08/27/2019   CREATININE 0.97 08/23/2019   CREATININE 1.07 (H) 08/21/2019   Assessment/Plan:   Other hyperlipidemia. Cardiovascular risk and specific lipid/LDL goals reviewed.  We discussed several lifestyle  modifications today and Jalesha will continue to work on diet, exercise and weight loss efforts. Orders and follow up as documented in patient record. She will continue her medication as directed.   Counseling Intensive lifestyle modifications are the first line treatment for this issue. . Dietary changes: Increase soluble fiber. Decrease simple carbohydrates. . Exercise changes: Moderate to vigorous-intensity aerobic activity 150 minutes per week if tolerated. . Lipid-lowering medications: see documented in medical record.  Essential hypertension. Smt is working on healthy weight loss and exercise to improve blood pressure control. We will watch for signs of hypotension as she continues her lifestyle modifications. She will continue her medication as directed.   Class 1 obesity with serious comorbidity and body mass index (BMI) of 33.0 to 33.9 in adult, unspecified obesity type.  Alejandra Crane is currently in the action stage of change. As such, her goal is to continue with weight loss efforts. She has agreed to keeping a food journal and adhering to recommended goals of 1200 calories and 80 grams of protein daily.   Exercise goals: Older adults should follow the adult guidelines. When older adults cannot meet the adult guidelines, they should be as physically active as their abilities and conditions will allow.   Behavioral modification strategies: meal planning and cooking strategies and keeping healthy foods in the home.  Alejandra Crane  has agreed to follow-up with our clinic in 3 weeks. She was informed of the importance of frequent follow-up visits to maximize her success with intensive lifestyle modifications for her multiple health conditions.   Objective:   Blood pressure 100/68, pulse 63, temperature 98.1 F (36.7 C), temperature source Oral, height 5\' 2"  (1.575 m), weight 180 lb (81.6 kg), SpO2 98 %. Body mass index is 32.92 kg/m.  General: Cooperative, alert, well developed, in no acute  distress. HEENT: Conjunctivae and lids unremarkable. Cardiovascular: Regular rhythm.  Lungs: Normal work of breathing. Neurologic: No focal deficits.   Lab Results  Component Value Date   CREATININE 0.89 08/27/2019   BUN 20 08/27/2019   NA 144 08/27/2019   K 3.9 08/27/2019   CL 111 (H) 08/27/2019   CO2 28 08/27/2019   Lab Results  Component Value Date   ALT 11 08/27/2019   AST 16 08/27/2019   ALKPHOS 48 08/23/2019   BILITOT 0.2 08/27/2019   Lab Results  Component Value Date   HGBA1C 5.8 (H) 06/22/2019   HGBA1C 5.6 03/15/2019   HGBA1C 5.6 03/16/2018   HGBA1C 5.6 09/19/2017   HGBA1C 5.6 12/25/2015   Lab Results  Component Value Date   INSULIN 7.1 06/22/2019   Lab Results  Component Value Date   TSH 2.910 06/22/2019   Lab Results  Component Value Date   CHOL 142 08/27/2019   HDL 52 08/27/2019   LDLCALC 76 08/27/2019   TRIG 49 08/27/2019   CHOLHDL 2.7 08/27/2019   Lab Results  Component Value Date   WBC 8.5 09/03/2019   HGB 8.6 (L) 09/03/2019   HCT 26.7 (L) 09/03/2019   MCV 99.6 09/03/2019   PLT 368 09/03/2019   Lab Results  Component Value Date   IRON 49 08/22/2019   TIBC 246 (L) 08/22/2019   FERRITIN 77 06/22/2019   Attestation Statements:   Reviewed by clinician on day of visit: allergies, medications, problem list, medical history, surgical history, family history, social history, and previous encounter notes.  Time spent on visit including pre-visit chart review and post-visit charting and care was 30 minutes.   I02/18/2021, am acting as transcriptionist for [employer], PA-C   I have reviewed the above documentation for accuracy and completeness, and I agree with the above. Alois Cliche, PA-C

## 2020-01-11 MED FILL — VALACYCLOVIR HCL 500 MG TAB: 500 | 90 days supply | Qty: 90 | Fill #1

## 2020-01-11 MED FILL — ROSUVASTATIN CALCIUM 5 MG T: 5 | 90 days supply | Qty: 90 | Fill #1

## 2020-01-11 MED FILL — HYDROCHLOROTHIAZIDE 25 MG T: 25 | 90 days supply | Qty: 90 | Fill #1

## 2020-01-11 MED FILL — SERTRALINE HCL 100 MG TABS: 100 | 90 days supply | Qty: 90 | Fill #1

## 2020-01-19 ENCOUNTER — Ambulatory Visit (INDEPENDENT_AMBULATORY_CARE_PROVIDER_SITE_OTHER): Payer: 59 | Admitting: Physician Assistant

## 2020-01-19 ENCOUNTER — Encounter (INDEPENDENT_AMBULATORY_CARE_PROVIDER_SITE_OTHER): Payer: Self-pay | Admitting: Physician Assistant

## 2020-01-19 ENCOUNTER — Other Ambulatory Visit: Payer: Self-pay

## 2020-01-19 VITALS — BP 98/58 | HR 63 | Temp 98.7°F | Ht 62.0 in | Wt 181.0 lb

## 2020-01-19 DIAGNOSIS — E559 Vitamin D deficiency, unspecified: Secondary | ICD-10-CM

## 2020-01-19 DIAGNOSIS — Z6833 Body mass index (BMI) 33.0-33.9, adult: Secondary | ICD-10-CM | POA: Diagnosis not present

## 2020-01-19 DIAGNOSIS — E7849 Other hyperlipidemia: Secondary | ICD-10-CM | POA: Diagnosis not present

## 2020-01-19 DIAGNOSIS — Z9189 Other specified personal risk factors, not elsewhere classified: Secondary | ICD-10-CM

## 2020-01-19 DIAGNOSIS — E669 Obesity, unspecified: Secondary | ICD-10-CM | POA: Diagnosis not present

## 2020-01-19 DIAGNOSIS — R7303 Prediabetes: Secondary | ICD-10-CM

## 2020-01-19 NOTE — Progress Notes (Signed)
Chief Complaint:   OBESITY Alejandra Crane is here to discuss her progress with her obesity treatment plan along with follow-up of her obesity related diagnoses. Alejandra Crane is on keeping a food journal and adhering to recommended goals of 1200 calories and 80 grams of protein daily and states she is following her eating plan approximately 10% of the time. Alejandra Crane states she is walking more.  Today's visit was #: 10 Starting weight: 203 lbs Starting date: 06/22/2019 Today's weight: 181 lbs Today's date: 01/19/2020 Total lbs lost to date: 22 Total lbs lost since last in-office visit: 0  Interim History: Alejandra Crane states that she had a birthday, and indulged in cake and went out for numerous birthday dinners. She also notes that work has been stressful.  Subjective:   1. Pre-diabetes Alejandra Crane is not on any medications and she denies polyphagia. She is due for labs.  2. Other hyperlipidemia Alejandra Crane is on Crestor, and she is tolerating it well.  3. Vitamin D deficiency Alejandra Crane is no longer taking Vit D, and she reports decrease in energy recently.  4. At risk for diabetes mellitus Alejandra Crane is at higher than average risk for developing diabetes due to her obesity.   Assessment/Plan:   1. Pre-diabetes Alejandra Crane will continue to work on weight loss, exercise, and decreasing simple carbohydrates to help decrease the risk of diabetes. We will check labs today.  - Hemoglobin A1c - Insulin, random  2. Other hyperlipidemia Cardiovascular risk and specific lipid/LDL goals reviewed. We discussed several lifestyle modifications today. Alejandra Crane will continue her medications, and will continue to work on diet, exercise and weight loss efforts. We will check labs today. Orders and follow up as documented in patient record.   Counseling Intensive lifestyle modifications are the first line treatment for this issue.  Dietary changes: Increase soluble fiber. Decrease simple carbohydrates.  Exercise changes: Moderate  to vigorous-intensity aerobic activity 150 minutes per week if tolerated.  Lipid-lowering medications: see documented in medical record.  - Lipid panel - Comprehensive metabolic panel  3. Vitamin D deficiency Low Vitamin D level contributes to fatigue and are associated with obesity, breast, and colon cancer. We will check labs today. Charon  will follow-up for routine testing of Vitamin D, at least 2-3 times per year to avoid over-replacement.  - VITAMIN D 25 Hydroxy (Vit-D Deficiency, Fractures)  4. At risk for diabetes mellitus Dellar was given approximately 15 minutes of diabetes education and counseling today. We discussed intensive lifestyle modifications today with an emphasis on weight loss as well as increasing exercise and decreasing simple carbohydrates in her diet. We also reviewed medication options with an emphasis on risk versus benefit of those discussed.   Repetitive spaced learning was employed today to elicit superior memory formation and behavioral change.  5. Class 1 obesity with serious comorbidity and body mass index (BMI) of 33.0 to 33.9 in adult, unspecified obesity type Jull is currently in the action stage of change. As such, her goal is to continue with weight loss efforts. She has agreed to the Category 2 Plan.   Exercise goals: As is.  Behavioral modification strategies: increasing lean protein intake and decreasing eating out.  Alejandra Crane has agreed to follow-up with our clinic in 3 weeks. She was informed of the importance of frequent follow-up visits to maximize her success with intensive lifestyle modifications for her multiple health conditions.   Alejandra Crane was informed we would discuss her lab results at her next visit unless there is a critical issue that  needs to be addressed sooner. Alejandra Crane agreed to keep her next visit at the agreed upon time to discuss these results.  Objective:   Blood pressure (!) 98/58, pulse 63, temperature 98.7 F (37.1 C),  temperature source Oral, height 5\' 2"  (1.575 m), weight 181 lb (82.1 kg), SpO2 96 %. Body mass index is 33.11 kg/m.  General: Cooperative, alert, well developed, in no acute distress. HEENT: Conjunctivae and lids unremarkable. Cardiovascular: Regular rhythm.  Lungs: Normal work of breathing. Neurologic: No focal deficits.   Lab Results  Component Value Date   CREATININE 0.89 08/27/2019   BUN 20 08/27/2019   NA 144 08/27/2019   K 3.9 08/27/2019   CL 111 (H) 08/27/2019   CO2 28 08/27/2019   Lab Results  Component Value Date   ALT 11 08/27/2019   AST 16 08/27/2019   ALKPHOS 48 08/23/2019   BILITOT 0.2 08/27/2019   Lab Results  Component Value Date   HGBA1C 5.8 (H) 06/22/2019   HGBA1C 5.6 03/15/2019   HGBA1C 5.6 03/16/2018   HGBA1C 5.6 09/19/2017   HGBA1C 5.6 12/25/2015   Lab Results  Component Value Date   INSULIN 7.1 06/22/2019   Lab Results  Component Value Date   TSH 2.910 06/22/2019   Lab Results  Component Value Date   CHOL 142 08/27/2019   HDL 52 08/27/2019   LDLCALC 76 08/27/2019   TRIG 49 08/27/2019   CHOLHDL 2.7 08/27/2019   Lab Results  Component Value Date   WBC 8.5 09/03/2019   HGB 8.6 (L) 09/03/2019   HCT 26.7 (L) 09/03/2019   MCV 99.6 09/03/2019   PLT 368 09/03/2019   Lab Results  Component Value Date   IRON 49 08/22/2019   TIBC 246 (L) 08/22/2019   FERRITIN 77 06/22/2019   Attestation Statements:   Reviewed by clinician on day of visit: allergies, medications, problem list, medical history, surgical history, family history, social history, and previous encounter notes.   06/24/2019, am acting as transcriptionist for Trude Mcburney, PA-C.  I have reviewed the above documentation for accuracy and completeness, and I agree with the above. [employer], PA-C

## 2020-01-20 LAB — COMPREHENSIVE METABOLIC PANEL
ALT: 12 IU/L (ref 0–32)
AST: 18 IU/L (ref 0–40)
Albumin/Globulin Ratio: 1.4 (ref 1.2–2.2)
Albumin: 4 g/dL (ref 3.8–4.8)
Alkaline Phosphatase: 83 IU/L (ref 44–121)
BUN/Creatinine Ratio: 25 (ref 12–28)
BUN: 21 mg/dL (ref 8–27)
Bilirubin Total: 0.2 mg/dL (ref 0.0–1.2)
CO2: 26 mmol/L (ref 20–29)
Calcium: 9.3 mg/dL (ref 8.7–10.3)
Chloride: 107 mmol/L — ABNORMAL HIGH (ref 96–106)
Creatinine, Ser: 0.83 mg/dL (ref 0.57–1.00)
GFR calc Af Amer: 84 mL/min/{1.73_m2} (ref >59)
GFR calc non Af Amer: 73 mL/min/{1.73_m2} (ref >59)
Globulin, Total: 2.9 g/dL (ref 1.5–4.5)
Glucose: 83 mg/dL (ref 65–99)
Potassium: 3.9 mmol/L (ref 3.5–5.2)
Sodium: 144 mmol/L (ref 134–144)
Total Protein: 6.9 g/dL (ref 6.0–8.5)

## 2020-01-20 LAB — HEMOGLOBIN A1C
Est. average glucose Bld gHb Est-mCnc: 123 mg/dL
Hgb A1c MFr Bld: 5.9 % — ABNORMAL HIGH (ref 4.8–5.6)

## 2020-01-20 LAB — VITAMIN D 25 HYDROXY (VIT D DEFICIENCY, FRACTURES): Vit D, 25-Hydroxy: 28.1 ng/mL — ABNORMAL LOW (ref 30.0–100.0)

## 2020-01-20 LAB — LIPID PANEL
Chol/HDL Ratio: 2.9 ratio (ref 0.0–4.4)
Cholesterol, Total: 195 mg/dL (ref 100–199)
HDL: 68 mg/dL (ref >39)
LDL Chol Calc (NIH): 118 mg/dL — ABNORMAL HIGH (ref 0–99)
Triglycerides: 47 mg/dL (ref 0–149)
VLDL Cholesterol Cal: 9 mg/dL (ref 5–40)

## 2020-01-20 LAB — INSULIN, RANDOM: INSULIN: 8.7 u[IU]/mL (ref 2.6–24.9)

## 2020-02-09 ENCOUNTER — Other Ambulatory Visit (INDEPENDENT_AMBULATORY_CARE_PROVIDER_SITE_OTHER): Payer: Self-pay | Admitting: Physician Assistant

## 2020-02-09 ENCOUNTER — Ambulatory Visit (INDEPENDENT_AMBULATORY_CARE_PROVIDER_SITE_OTHER): Payer: 59 | Admitting: Physician Assistant

## 2020-02-09 ENCOUNTER — Telehealth: Payer: Self-pay

## 2020-02-09 ENCOUNTER — Encounter (INDEPENDENT_AMBULATORY_CARE_PROVIDER_SITE_OTHER): Payer: Self-pay | Admitting: Physician Assistant

## 2020-02-09 ENCOUNTER — Other Ambulatory Visit: Payer: Self-pay

## 2020-02-09 VITALS — BP 98/64 | HR 71 | Temp 98.3°F | Ht 62.0 in | Wt 181.0 lb

## 2020-02-09 DIAGNOSIS — R7303 Prediabetes: Secondary | ICD-10-CM

## 2020-02-09 DIAGNOSIS — Z9189 Other specified personal risk factors, not elsewhere classified: Secondary | ICD-10-CM

## 2020-02-09 DIAGNOSIS — E559 Vitamin D deficiency, unspecified: Secondary | ICD-10-CM | POA: Diagnosis not present

## 2020-02-09 DIAGNOSIS — E669 Obesity, unspecified: Secondary | ICD-10-CM | POA: Diagnosis not present

## 2020-02-09 DIAGNOSIS — Z6833 Body mass index (BMI) 33.0-33.9, adult: Secondary | ICD-10-CM | POA: Diagnosis not present

## 2020-02-09 MED ORDER — VITAMIN D (ERGOCALCIFEROL) 1.25 MG (50000 UNIT) PO CAPS: 50000.0000 [IU] | ORAL_CAPSULE | ORAL | 0 refills | Status: DC

## 2020-02-09 MED FILL — VIT D2 1.25 MG (50,000 UNIT: 1.25 MG | 28 days supply | Qty: 4 | Fill #0

## 2020-02-09 NOTE — Telephone Encounter (Signed)
A sticky note was given to me from [name], NP that was given to her by Elveria Royals, CMA in which  a front desk representative gave to her.  Contents of sticky note:  She had blood work done through [facility] a couple weeks ago. Does she still need to come tomorrow. Jessica circled no.  I called patient and left a message with Jessica's response. I advised patient to return call if she has additional questions or concerns.  Appointment was canceled

## 2020-02-09 NOTE — Progress Notes (Signed)
Chief Complaint:   OBESITY Alejandra Crane is here to discuss her progress with her obesity treatment plan along with follow-up of her obesity related diagnoses. Alejandra Crane is keeping a food journal and adhering to recommended goals of 1200 calories and 80 grams of protein and states she is following her eating plan approximately 25% of the time. Alejandra Crane states she is doing weights/cardio 30 minutes 3 times per week.  Today's visit was #: 11 Starting weight: 203 lbs Starting date: 06/22/2019 Today's weight: 181 lbs Today's date: 02/09/2020 Total lbs lost to date: 22 Total lbs lost since last in-office visit: 0  Interim History: Alejandra Crane reports that she has not been planning her meals. If she does not plan, she tends to "grab things here and there." She does well with breakfast, but struggles with lunch and dinner.  Subjective:   Vitamin D deficiency. Alejandra Crane is on prescription Vitamin D daily. Last level was not at goal. She did not take her weekly vitamins.   Ref. Range 01/19/2020 09:30  Vitamin D, 25-Hydroxy Latest Ref Range: 30.0 - 100.0 ng/mL 28.1 (L)   Pre-diabetes. Derra has a diagnosis of prediabetes based on her elevated HgA1c and was informed this puts her at greater risk of developing diabetes. She continues to work on diet and exercise to decrease her risk of diabetes. She denies nausea or hypoglycemia. Alejandra Crane is on no medication and does not want to start on medication.  Lab Results  Component Value Date   HGBA1C 5.9 (H) 01/19/2020   Lab Results  Component Value Date   INSULIN 8.7 01/19/2020   INSULIN 7.1 06/22/2019   At risk for osteoporosis. Alejandra Crane is at higher risk of osteopenia and osteoporosis due to Vitamin D deficiency.   Assessment/Plan:   Vitamin D deficiency. Low Vitamin D level contributes to fatigue and are associated with obesity, breast, and colon cancer. She was given a refill on her Vitamin D, Ergocalciferol, (DRISDOL) 1.25 MG (50000 UNIT) CAPS  capsule every week #4 with 0 refills and will follow-up for routine testing of Vitamin D, at least 2-3 times per year to avoid over-replacement.   Pre-diabetes. Aireona will continue to work on weight loss, exercise, and decreasing simple carbohydrates to help decrease the risk of diabetes. She declines medication at this time as she wants to work on diet and exercise.  At risk for osteoporosis. Alejandra Crane was given approximately 15 minutes of osteoporosis prevention counseling today. Alejandra Crane is at risk for osteopenia and osteoporosis due to her Vitamin D deficiency. She was encouraged to take her Vitamin D and follow her higher calcium diet and increase strengthening exercise to help strengthen her bones and decrease her risk of osteopenia and osteoporosis.  Repetitive spaced learning was employed today to elicit superior memory formation and behavioral change.  Class 1 obesity with serious comorbidity and body mass index (BMI) of 33.0 to 33.9 in adult, unspecified obesity type.  Alejandra Crane is currently in the action stage of change. As such, her goal is to continue with weight loss efforts. She has agreed to keeping a food journal and adhering to recommended goals of 1200 calories and 80 grams of protein.   Exercise goals: Older adults should follow the adult guidelines. When older adults cannot meet the adult guidelines, they should be as physically active as their abilities and conditions will allow.   Behavioral modification strategies: decreasing eating out and meal planning and cooking strategies.  Alejandra Crane has agreed to follow-up with our clinic in 3 weeks.  She was informed of the importance of frequent follow-up visits to maximize her success with intensive lifestyle modifications for her multiple health conditions.   Objective:   Blood pressure 98/64, pulse 71, temperature 98.3 F (36.8 C), height 5\' 2"  (1.575 m), weight 181 lb (82.1 kg), SpO2 98 %. Body mass index is 33.11 kg/m.  General:  Cooperative, alert, well developed, in no acute distress. HEENT: Conjunctivae and lids unremarkable. Cardiovascular: Regular rhythm.  Lungs: Normal work of breathing. Neurologic: No focal deficits.   Lab Results  Component Value Date   CREATININE 0.83 01/19/2020   BUN 21 01/19/2020   NA 144 01/19/2020   K 3.9 01/19/2020   CL 107 (H) 01/19/2020   CO2 26 01/19/2020   Lab Results  Component Value Date   ALT 12 01/19/2020   AST 18 01/19/2020   ALKPHOS 83 01/19/2020   BILITOT 0.2 01/19/2020   Lab Results  Component Value Date   HGBA1C 5.9 (H) 01/19/2020   HGBA1C 5.8 (H) 06/22/2019   HGBA1C 5.6 03/15/2019   HGBA1C 5.6 03/16/2018   HGBA1C 5.6 09/19/2017   Lab Results  Component Value Date   INSULIN 8.7 01/19/2020   INSULIN 7.1 06/22/2019   Lab Results  Component Value Date   TSH 2.910 06/22/2019   Lab Results  Component Value Date   CHOL 195 01/19/2020   HDL 68 01/19/2020   LDLCALC 118 (H) 01/19/2020   TRIG 47 01/19/2020   CHOLHDL 2.9 01/19/2020   Lab Results  Component Value Date   WBC 8.5 09/03/2019   HGB 8.6 (L) 09/03/2019   HCT 26.7 (L) 09/03/2019   MCV 99.6 09/03/2019   PLT 368 09/03/2019   Lab Results  Component Value Date   IRON 49 08/22/2019   TIBC 246 (L) 08/22/2019   FERRITIN 77 06/22/2019   Attestation Statements:   Reviewed by clinician on day of visit: allergies, medications, problem list, medical history, surgical history, family history, social history, and previous encounter notes.  I02/18/2021, am acting as transcriptionist for Marianna Payment, PA-C   I have reviewed the above documentation for accuracy and completeness, and I agree with the above. Alois Cliche, PA-C

## 2020-02-10 ENCOUNTER — Other Ambulatory Visit: Payer: 59

## 2020-02-22 ENCOUNTER — Ambulatory Visit (INDEPENDENT_AMBULATORY_CARE_PROVIDER_SITE_OTHER): Payer: 59

## 2020-02-22 ENCOUNTER — Other Ambulatory Visit: Payer: Self-pay

## 2020-02-22 DIAGNOSIS — Z23 Encounter for immunization: Secondary | ICD-10-CM | POA: Diagnosis not present

## 2020-02-25 ENCOUNTER — Ambulatory Visit: Payer: 59 | Admitting: Nurse Practitioner

## 2020-03-01 ENCOUNTER — Ambulatory Visit: Payer: Medicare Other | Admitting: Nurse Practitioner

## 2020-03-15 ENCOUNTER — Other Ambulatory Visit: Payer: Self-pay | Admitting: Nurse Practitioner

## 2020-03-15 ENCOUNTER — Ambulatory Visit (INDEPENDENT_AMBULATORY_CARE_PROVIDER_SITE_OTHER): Payer: 59 | Admitting: Nurse Practitioner

## 2020-03-15 ENCOUNTER — Other Ambulatory Visit: Payer: Self-pay

## 2020-03-15 ENCOUNTER — Encounter: Payer: Self-pay | Admitting: Nurse Practitioner

## 2020-03-15 VITALS — BP 118/76 | HR 62 | Temp 96.8°F | Ht 62.0 in | Wt 182.0 lb

## 2020-03-15 DIAGNOSIS — E559 Vitamin D deficiency, unspecified: Secondary | ICD-10-CM

## 2020-03-15 DIAGNOSIS — F325 Major depressive disorder, single episode, in full remission: Secondary | ICD-10-CM

## 2020-03-15 DIAGNOSIS — I10 Essential (primary) hypertension: Secondary | ICD-10-CM | POA: Diagnosis not present

## 2020-03-15 DIAGNOSIS — E2839 Other primary ovarian failure: Secondary | ICD-10-CM

## 2020-03-15 DIAGNOSIS — D649 Anemia, unspecified: Secondary | ICD-10-CM | POA: Diagnosis not present

## 2020-03-15 DIAGNOSIS — E782 Mixed hyperlipidemia: Secondary | ICD-10-CM | POA: Diagnosis not present

## 2020-03-15 LAB — CBC WITH DIFFERENTIAL/PLATELET
Absolute Monocytes: 608 cells/uL (ref 200–950)
Basophils Absolute: 38 cells/uL (ref 0–200)
Basophils Relative: 0.5 %
Eosinophils Absolute: 91 cells/uL (ref 15–500)
Eosinophils Relative: 1.2 %
HCT: 35.4 % (ref 35.0–45.0)
Hemoglobin: 11.2 g/dL — ABNORMAL LOW (ref 11.7–15.5)
Lymphs Abs: 2660 cells/uL (ref 850–3900)
MCH: 30.2 pg (ref 27.0–33.0)
MCHC: 31.6 g/dL — ABNORMAL LOW (ref 32.0–36.0)
MCV: 95.4 fL (ref 80.0–100.0)
MPV: 10.4 fL (ref 7.5–12.5)
Monocytes Relative: 8 %
Neutro Abs: 4203 cells/uL (ref 1500–7800)
Neutrophils Relative %: 55.3 %
Platelets: 278 10*3/uL (ref 140–400)
RBC: 3.71 10*6/uL — ABNORMAL LOW (ref 3.80–5.10)
RDW: 14.4 % (ref 11.0–15.0)
Total Lymphocyte: 35 %
WBC: 7.6 10*3/uL (ref 3.8–10.8)

## 2020-03-15 MED ORDER — VITAMIN D (ERGOCALCIFEROL) 1.25 MG (50000 UNIT) PO CAPS: 50000.0000 [IU] | ORAL_CAPSULE | ORAL | 0 refills | Status: DC

## 2020-03-15 MED FILL — VIT D2 1.25 MG (50,000 UNIT: 1.25 MG | 12 days supply | Qty: 12 | Fill #0 | Status: TO

## 2020-03-15 NOTE — Patient Instructions (Addendum)
Take vit d 50,000 units weekly once complete to start vit d 2000 units daily Continue on calcium supplement 600 mg twice daily   Dr Linus Galas [address]  [place] [address]  [phone]    To call [facility] to schedule bone density

## 2020-03-15 NOTE — Progress Notes (Signed)
Careteam: Patient Care Team: [name], NP as PCP - General (Geriatric Medicine) Drema Dallas, DO as Consulting Physician (Neurology)  PLACE OF SERVICE:  [facility]  Advanced Directive information Does Patient Have a Medical Advance Directive?: Yes, Type of Advance Directive: Healthcare Power of Attorney, Does patient want to make changes to medical advance directive?: No - Patient declined  Allergies  Allergen Reactions  . Mobic [Meloxicam]     GI bleed    Chief Complaint  Patient presents with  . Medical Management of Chronic Issues    6 month follow-up      HPI: Patient is a 69 y.o. female for routine follow up  Has lost 20 lbs with weight and wellness. She is having a lot of problems with sugar and waiting sweets.  Goal weight is 150 lbs.  Going to gym and walking, hiking some.   Hyperglycemia- A1c 5.9  Vit d supplement- was on vit d 50000 units daily but only was given a month supply.   Hyperlipidemia- LDL elevated but HDl elevated. Continues on crestor, does not take daily, only takes 3 times a week, reports she increased to daily but had a lot of myalagias    Review of Systems:  Review of Systems  Constitutional: Negative for chills, fever and weight loss.  HENT: Negative for tinnitus.   Respiratory: Negative for cough, sputum production and shortness of breath.   Cardiovascular: Negative for chest pain, palpitations and leg swelling.  Gastrointestinal: Negative for abdominal pain, constipation, diarrhea and heartburn.  Genitourinary: Negative for dysuria, frequency and urgency.  Musculoskeletal: Negative for back pain, falls, joint pain and myalgias.  Skin: Negative.   Neurological: Negative for dizziness and headaches.  Psychiatric/Behavioral: Negative for depression and memory loss. The patient does not have insomnia.     Past Medical History:  Diagnosis Date  . Anemia   . Anxiety   . Arthritis   . Asthma   . Depression   .  Diverticulosis    noted on colonscopy   . Elevated cholesterol   . GERD (gastroesophageal reflux disease)   . H/O mammogram    2015, 2016  . Hepatitis   . 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 High blood pressure   . History of esophagogastroduodenoscopy (EGD)    2014, 2016  . History of PFTs 10/26/2012  . Lower GI bleed 01/19/2015   Related to mobic 15 mg   . Mitral valve prolapse 09/06/1996  . Osteoarthritis of both knees   . Pneumonia    Past Surgical History:  Procedure Laterality Date  . BREAST EXCISIONAL BIOPSY Left    benign  . BREAST SURGERY  05/20/2007   [facility], Dr.William Purkert   . COLONOSCOPY     2014, 2016 Dr.Bajani Manalo, Dr.Mahmood Abedi   . KNEE ARTHROSCOPY Right    Social History:   reports that she quit smoking about 33 years ago. Her smoking use included cigarettes. She has a 2.50 pack-year smoking history. She has never used smokeless tobacco. She reports current alcohol use. She reports that she does not use drugs.  Family History  Problem Relation Age of Onset  . Heart attack Mother   . CVA Mother   . Diabetes Mother   . Congestive Heart Failure Mother   . Stroke Mother   . Heart disease Mother   . Hyperlipidemia Mother   . Hypertension Mother   . Depression Mother   . Obesity Mother   . Lung  cancer Sister 33  . Stomach cancer Brother   . Neurologic Disorder Son   . Colon cancer Niece   . Diabetes Maternal Grandmother     Medications: Patient's Medications  New Prescriptions   No medications on file  Previous Medications   DOCUSATE SODIUM (COLACE) 100 MG CAPSULE    Take 100 mg by mouth as needed.    FERROUS SULFATE 325 (65 FE) MG EC TABLET    Take 325 mg by mouth daily with breakfast.   HYDROCHLOROTHIAZIDE (HYDRODIURIL) 25 MG TABLET    TAKE 1 TABLET BY MOUTH DAILY.   ROSUVASTATIN (CRESTOR) 5 MG TABLET    Take 1 tablet (5 mg total) by mouth at bedtime.   SERTRALINE (ZOLOFT) 100 MG TABLET    TAKE 1 TABLET BY MOUTH DAILY.   VALACYCLOVIR (VALTREX)  500 MG TABLET    Take 1 tablet (500 mg total) by mouth daily.   VITAMIN D, ERGOCALCIFEROL, (DRISDOL) 1.25 MG (50000 UNIT) CAPS CAPSULE    Take 1 capsule (50,000 Units total) by mouth every 7 (seven) days.  Modified Medications   No medications on file  Discontinued Medications   CALCIUM CARBONATE-VITAMIN D 600-400 MG-UNIT TABLET    Take 1 tablet by mouth daily.   DICLOFENAC SODIUM (VOLTAREN) 1 % GEL    Apply 4 g topically 4 (four) times daily.   POLYETHYLENE GLYCOL POWDER (MIRALAX) 17 GM/SCOOP POWDER    Take 17 g by mouth 2 (two) times daily as needed for moderate constipation.    Physical Exam:  Vitals:   03/15/20 1310  BP: 118/76  Pulse: 62  Temp: (!) 96.8 F (36 C)  TempSrc: Temporal  SpO2: 98%  Weight: 182 lb (82.6 kg)  Height: 5\' 2"  (1.575 m)   Body mass index is 33.29 kg/m. Wt Readings from Last 3 Encounters:  03/15/20 182 lb (82.6 kg)  02/09/20 181 lb (82.1 kg)  01/19/20 181 lb (82.1 kg)    Physical Exam Constitutional:      General: She is not in acute distress.    Appearance: She is well-developed. She is not diaphoretic.  HENT:     Head: Normocephalic and atraumatic.     Mouth/Throat:     Pharynx: No oropharyngeal exudate.  Eyes:     Conjunctiva/sclera: Conjunctivae normal.     Pupils: Pupils are equal, round, and reactive to light.  Cardiovascular:     Rate and Rhythm: Normal rate and regular rhythm.     Heart sounds: Normal heart sounds.  Pulmonary:     Effort: Pulmonary effort is normal.     Breath sounds: Normal breath sounds.  Abdominal:     General: Bowel sounds are normal.     Palpations: Abdomen is soft.  Musculoskeletal:        General: No tenderness.     Cervical back: Normal range of motion and neck supple.  Skin:    General: Skin is warm and dry.  Neurological:     Mental Status: She is alert and oriented to person, place, and time.    Labs reviewed: Basic Metabolic Panel: Recent Labs    06/22/19 1317 08/21/19 2353 08/23/19 0434  08/27/19 1108 01/19/20 0930  NA 140   < > 143 144 144  K 3.8   < > 3.5 3.9 3.9  CL 102   < > 110 111* 107*  CO2 24   < > 25 28 26   GLUCOSE 88   < > 97 94 83  BUN 23   < >  18 20 21   CREATININE 0.83   < > 0.97 0.89 0.83  CALCIUM 9.4   < > 8.8* 8.9 9.3  MG  --   --  1.9  --   --   TSH 2.910  --   --   --   --    < > = values in this interval not displayed.   Liver Function Tests: Recent Labs    08/21/19 2353 08/21/19 2353 08/23/19 0434 08/27/19 1108 01/19/20 0930  AST 20   < > 18 16 18   ALT 14   < > 13 11 12   ALKPHOS 61  --  48  --  83  BILITOT 0.3   < > 0.4 0.2 0.2  PROT 6.2*   < > 5.0* 5.8* 6.9  ALBUMIN 3.4*  --  2.5*  --  4.0   < > = values in this interval not displayed.   No results for input(s): LIPASE, AMYLASE in the last 8760 hours. No results for input(s): AMMONIA in the last 8760 hours. CBC: Recent Labs    08/23/19 1556 08/27/19 1108 09/03/19 0825  WBC 8.3 6.8 8.5  NEUTROABS 4.5 3,869 4,369  HGB 8.7* 7.9* 8.6*  HCT 27.5* 24.4* 26.7*  MCV 97.9 97.2 99.6  PLT 239 269 368   Lipid Panel: Recent Labs    06/22/19 1317 08/27/19 1108 01/19/20 0930  CHOL 215* 142 195  HDL 77 52 68  LDLCALC 123* 76 118*  TRIG 86 49 47  CHOLHDL 2.8 2.7 2.9   TSH: Recent Labs    06/22/19 1317  TSH 2.910   A1C: Lab Results  Component Value Date   HGBA1C 5.9 (H) 01/19/2020     Assessment/Plan 1. Anemia, unspecified type -from acute blood loss, continues on iron, will follow up lab - CBC with Differential/Platelet  2. Vitamin D deficiency Noted, on recent lab, continue vit d 50,000 units for 3 months then to start vit d 2000 units daily - Vitamin D, Ergocalciferol, (DRISDOL) 1.25 MG (50000 UNIT) CAPS capsule; Take 1 capsule (50,000 Units total) by mouth every 7 (seven) days.  Dispense: 12 capsule; Refill: 0  3. Estrogen deficiency - DG Bone Density; Future  4. Mixed hyperlipidemia -only able to tolerate crestor every other day or she gets myalgias, continues  on crestor 5 mg every other day with dietary modifications.   5. Depression, major, single episode, complete remission (HCC) controlled on zoloft 100 mg daily  6. Essential hypertension Well controlled on hctz. Continue dietary modification and increase in physical activity.   Next appt: 6 months.  01/21/20. 06/24/19  [facility] [phone]

## 2020-03-17 MED FILL — VIT D2 1.25 MG (50,000 UNIT: 1.25 MG | 84 days supply | Qty: 12 | Fill #0

## 2020-03-22 ENCOUNTER — Encounter (INDEPENDENT_AMBULATORY_CARE_PROVIDER_SITE_OTHER): Payer: Self-pay | Admitting: Physician Assistant

## 2020-03-22 ENCOUNTER — Other Ambulatory Visit: Payer: Self-pay

## 2020-03-22 ENCOUNTER — Ambulatory Visit (INDEPENDENT_AMBULATORY_CARE_PROVIDER_SITE_OTHER): Payer: 59 | Admitting: Physician Assistant

## 2020-03-22 VITALS — BP 98/63 | HR 70 | Temp 98.1°F | Ht 62.0 in | Wt 179.0 lb

## 2020-03-22 DIAGNOSIS — I10 Essential (primary) hypertension: Secondary | ICD-10-CM | POA: Diagnosis not present

## 2020-03-22 DIAGNOSIS — R7303 Prediabetes: Secondary | ICD-10-CM

## 2020-03-22 DIAGNOSIS — E669 Obesity, unspecified: Secondary | ICD-10-CM

## 2020-03-22 DIAGNOSIS — Z6832 Body mass index (BMI) 32.0-32.9, adult: Secondary | ICD-10-CM

## 2020-03-27 NOTE — Progress Notes (Signed)
Chief Complaint:   OBESITY Alejandra Crane is here to discuss her progress with her obesity treatment plan along with follow-up of her obesity related diagnoses. Alejandra Crane is keeping a food journal and adhering to recommended goals of 1200 calories and 96 grams of protein and states she is following her eating plan approximately 75% of the time. Alejandra Crane states she is trying to be active.   Today's visit was #: 12 Starting weight: 203 lbs Starting date: 06/22/2019 Today's weight: 179 lbs Today's date: 03/22/2020 Total lbs lost to date: 24 Total lbs lost since last in-office visit: 2  Interim History: Alejandra Crane states that she did great with breakfast, but still struggles with lunch and dinner. Some days she does not eat after breakfast because she is either not hungry or she is worried she will gain weight.  Subjective:   Essential hypertension. Alejandra Crane is on HCTZ. Blood pressure is controlled. No  chest pain or headache. She is followed by her PCP.  BP Readings from Last 3 Encounters:  03/22/20 98/63  03/15/20 118/76  02/09/20 98/64   Lab Results  Component Value Date   CREATININE 0.83 01/19/2020   CREATININE 0.89 08/27/2019   CREATININE 0.97 08/23/2019   Prediabetes. Alejandra Crane has a diagnosis of prediabetes based on her elevated HgA1c and was informed this puts her at greater risk of developing diabetes. She continues to work on diet and exercise to decrease her risk of diabetes. She denies nausea or hypoglycemia. Alejandra Crane is on no medication. No polyphagia.  Lab Results  Component Value Date   HGBA1C 5.9 (H) 01/19/2020   Lab Results  Component Value Date   INSULIN 8.7 01/19/2020   INSULIN 7.1 06/22/2019   Assessment/Plan:   Essential hypertension. Alejandra Crane is working on healthy weight loss and exercise to improve blood pressure control. We will watch for signs of hypotension as she continues her lifestyle modifications. She will continue her medication as directed.    Prediabetes. Alejandra Crane will continue to work on weight loss, exercise, and decreasing simple carbohydrates to help decrease the risk of diabetes. Will recheck A1c every 3 months.   Class 1 obesity with serious comorbidity and body mass index (BMI) of 32.0 to 32.9 in adult, unspecified obesity type.  Alejandra Crane is currently in the action stage of change. As such, her goal is to continue with weight loss efforts. She has agreed to keeping a food journal and adhering to recommended goals of 1200 calories and 80 grams of protein daily.   Exercise goals: Alejandra Crane should follow the adult guidelines. When Alejandra Crane cannot meet the adult guidelines, they should be as physically active as their abilities and conditions will allow.   Behavioral modification strategies: no skipping meals and holiday eating strategies .  Alejandra Crane has agreed to follow-up with our clinic in 4 weeks. She was informed of the importance of frequent follow-up visits to maximize her success with intensive lifestyle modifications for her multiple health conditions.   Objective:   Blood pressure 98/63, pulse 70, temperature 98.1 F (36.7 C), temperature source Oral, height 5\' 2"  (1.575 m), weight 179 lb (81.2 kg), SpO2 99 %. Body mass index is 32.74 kg/m.  General: Cooperative, alert, well developed, in no acute distress. HEENT: Conjunctivae and lids unremarkable. Cardiovascular: Regular rhythm.  Lungs: Normal work of breathing. Neurologic: No focal deficits.   Lab Results  Component Value Date   CREATININE 0.83 01/19/2020   BUN 21 01/19/2020   NA 144 01/19/2020   K 3.9  01/19/2020   CL 107 (H) 01/19/2020   CO2 26 01/19/2020   Lab Results  Component Value Date   ALT 12 01/19/2020   AST 18 01/19/2020   ALKPHOS 83 01/19/2020   BILITOT 0.2 01/19/2020   Lab Results  Component Value Date   HGBA1C 5.9 (H) 01/19/2020   HGBA1C 5.8 (H) 06/22/2019   HGBA1C 5.6 03/15/2019   HGBA1C 5.6 03/16/2018   HGBA1C 5.6  09/19/2017   Lab Results  Component Value Date   INSULIN 8.7 01/19/2020   INSULIN 7.1 06/22/2019   Lab Results  Component Value Date   TSH 2.910 06/22/2019   Lab Results  Component Value Date   CHOL 195 01/19/2020   HDL 68 01/19/2020   LDLCALC 118 (H) 01/19/2020   TRIG 47 01/19/2020   CHOLHDL 2.9 01/19/2020   Lab Results  Component Value Date   WBC 7.6 03/15/2020   HGB 11.2 (L) 03/15/2020   HCT 35.4 03/15/2020   MCV 95.4 03/15/2020   PLT 278 03/15/2020   Lab Results  Component Value Date   IRON 49 08/22/2019   TIBC 246 (L) 08/22/2019   FERRITIN 77 06/22/2019   Obesity Behavioral Intervention:   Approximately 15 minutes were spent on the discussion below.  ASK: We discussed the diagnosis of obesity with [name] today and [name] agreed to give [place] permission to discuss obesity behavioral modification therapy today.  ASSESS: Alejandra Crane has the diagnosis of obesity and her BMI today is 32.9. Alejandra Crane is in the action stage of change.   ADVISE: Alejandra Crane was educated on the multiple health risks of obesity as well as the benefit of weight loss to improve her health. She was advised of the need for long term treatment and the importance of lifestyle modifications to improve her current health and to decrease her risk of future health problems.  AGREE: Multiple dietary modification options and treatment options were discussed and Alejandra Crane agreed to follow the recommendations documented in the above note.  ARRANGE: Alejandra Crane was educated on the importance of frequent visits to treat obesity as outlined per CMS and USPSTF guidelines and agreed to schedule her next follow up appointment today.  Attestation Statements:   Reviewed by clinician on day of visit: allergies, medications, problem list, medical history, surgical history, family history, social history, and previous encounter notes.  IMarianna Payment, am acting as transcriptionist for Alois Cliche, PA-C   I have reviewed the  above documentation for accuracy and completeness, and I agree with the above. Alois Cliche, PA-C

## 2020-04-14 ENCOUNTER — Other Ambulatory Visit: Payer: Self-pay | Admitting: Nurse Practitioner

## 2020-04-14 DIAGNOSIS — Z1231 Encounter for screening mammogram for malignant neoplasm of breast: Secondary | ICD-10-CM

## 2020-04-18 ENCOUNTER — Ambulatory Visit (INDEPENDENT_AMBULATORY_CARE_PROVIDER_SITE_OTHER): Payer: 59 | Admitting: Physician Assistant

## 2020-04-18 ENCOUNTER — Encounter (INDEPENDENT_AMBULATORY_CARE_PROVIDER_SITE_OTHER): Payer: Self-pay | Admitting: Physician Assistant

## 2020-04-18 ENCOUNTER — Other Ambulatory Visit: Payer: Self-pay

## 2020-04-18 VITALS — BP 102/62 | HR 60 | Temp 98.0°F | Ht 62.0 in | Wt 176.0 lb

## 2020-04-18 DIAGNOSIS — Z6832 Body mass index (BMI) 32.0-32.9, adult: Secondary | ICD-10-CM

## 2020-04-18 DIAGNOSIS — E7849 Other hyperlipidemia: Secondary | ICD-10-CM | POA: Diagnosis not present

## 2020-04-18 DIAGNOSIS — E669 Obesity, unspecified: Secondary | ICD-10-CM

## 2020-04-18 DIAGNOSIS — E559 Vitamin D deficiency, unspecified: Secondary | ICD-10-CM | POA: Diagnosis not present

## 2020-04-18 NOTE — Progress Notes (Signed)
Chief Complaint:   OBESITY Alejandra Crane is here to discuss her progress with her obesity treatment plan along with follow-up of her obesity related diagnoses. Alejandra Crane is using [employer] and states she is following her eating plan approximately 75% of the time. Alejandra Crane states she is exercising 0 minutes 0 times per week.  Today's visit was #: 13 Starting weight: 203 lbs Starting date: 06/22/2019 Today's weight: 176 lbs Today's date: 04/18/2020 Total lbs lost to date: 27 Total lbs lost since last in-office visit: 3  Interim History: [name] reports that she has not been journaling consistently. She feels that when she journals she does much better.  Subjective:   Vitamin D deficiency. Alejandra Crane is on Vitamin D weekly. No nausea, vomiting, or muscle weakness. She is due for labs at her next visit.   Ref. Range 01/19/2020 09:30  Vitamin D, 25-Hydroxy Latest Ref Range: 30.0 - 100.0 ng/mL 28.1 (L)   Other hyperlipidemia with hypertension. Alejandra Crane is on Crestor. Last LDL was not at goal. No chest pain or myalgias.    Lab Results  Component Value Date   CHOL 195 01/19/2020   HDL 68 01/19/2020   LDLCALC 118 (H) 01/19/2020   TRIG 47 01/19/2020   CHOLHDL 2.9 01/19/2020   Lab Results  Component Value Date   ALT 12 01/19/2020   AST 18 01/19/2020   ALKPHOS 83 01/19/2020   BILITOT 0.2 01/19/2020   The 10-year ASCVD risk score Denman George DC Jr., et al., 2013) is: 6.6%   Values used to calculate the score:     Age: 89 years     Sex: Female     Is Non-Hispanic African American: Yes     Diabetic: No     Tobacco smoker: No     Systolic Blood Pressure: 102 mmHg     Is BP treated: Yes     HDL Cholesterol: 68 mg/dL     Total Cholesterol: 195 mg/dL  Assessment/Plan:   Vitamin D deficiency. Low Vitamin D level contributes to fatigue and are associated with obesity, breast, and colon cancer. She agrees to continue to take Vitamin D as directed and will follow-up for routine testing of  Vitamin D at the time of her next office visit.  Other hyperlipidemia with hypertension. Cardiovascular risk and specific lipid/LDL goals reviewed.  We discussed several lifestyle modifications today and [name] will continue to work on diet, exercise and weight loss efforts. Orders and follow up as documented in patient record. She will continue her medication as directed and continue her plan.  Counseling Intensive lifestyle modifications are the first line treatment for this issue. . Dietary changes: Increase soluble fiber. Decrease simple carbohydrates. . Exercise changes: Moderate to vigorous-intensity aerobic activity 150 minutes per week if tolerated. . Lipid-lowering medications: see documented in medical record.  Class 1 obesity with serious comorbidity and body mass index (BMI) of 32.0 to 32.9 in adult, unspecified obesity type.  Alejandra Crane is currently in the action stage of change. As such, her goal is to continue with weight loss efforts. She has agreed to keeping a food journal and adhering to recommended goals of 1200 calories and 85 grams of protein.   Fasting labs will be checked at her next visit.  Exercise goals: Older adults should follow the adult guidelines. When older adults cannot meet the adult guidelines, they should be as physically active as their abilities and conditions will allow.   Behavioral modification strategies: increasing lean protein intake and no  skipping meals.  Alejandra Crane has agreed to follow-up with our clinic in 4 weeks. She was informed of the importance of frequent follow-up visits to maximize her success with intensive lifestyle modifications for her multiple health conditions.   Objective:   Blood pressure 102/62, pulse 60, temperature 98 F (36.7 C), height 5\' 2"  (1.575 m), weight 176 lb (79.8 kg), SpO2 99 %. Body mass index is 32.19 kg/m.  General: Cooperative, alert, well developed, in no acute distress. HEENT: Conjunctivae and lids  unremarkable. Cardiovascular: Regular rhythm.  Lungs: Normal work of breathing. Neurologic: No focal deficits.   Lab Results  Component Value Date   CREATININE 0.83 01/19/2020   BUN 21 01/19/2020   NA 144 01/19/2020   K 3.9 01/19/2020   CL 107 (H) 01/19/2020   CO2 26 01/19/2020   Lab Results  Component Value Date   ALT 12 01/19/2020   AST 18 01/19/2020   ALKPHOS 83 01/19/2020   BILITOT 0.2 01/19/2020   Lab Results  Component Value Date   HGBA1C 5.9 (H) 01/19/2020   HGBA1C 5.8 (H) 06/22/2019   HGBA1C 5.6 03/15/2019   HGBA1C 5.6 03/16/2018   HGBA1C 5.6 09/19/2017   Lab Results  Component Value Date   INSULIN 8.7 01/19/2020   INSULIN 7.1 06/22/2019   Lab Results  Component Value Date   TSH 2.910 06/22/2019   Lab Results  Component Value Date   CHOL 195 01/19/2020   HDL 68 01/19/2020   LDLCALC 118 (H) 01/19/2020   TRIG 47 01/19/2020   CHOLHDL 2.9 01/19/2020   Lab Results  Component Value Date   WBC 7.6 03/15/2020   HGB 11.2 (L) 03/15/2020   HCT 35.4 03/15/2020   MCV 95.4 03/15/2020   PLT 278 03/15/2020   Lab Results  Component Value Date   IRON 49 08/22/2019   TIBC 246 (L) 08/22/2019   FERRITIN 77 06/22/2019   Obesity Behavioral Intervention:   Approximately 15 minutes were spent on the discussion below.  ASK: We discussed the diagnosis of obesity with 06/24/2019 today and Alejandra Crane agreed to give Alejandra Crane permission to discuss obesity behavioral modification therapy today.  ASSESS: Alejandra Crane has the diagnosis of obesity and her BMI today is 32.2. Alejandra Crane is in the action stage of change.   ADVISE: Alejandra Crane was educated on the multiple health risks of obesity as well as the benefit of weight loss to improve her health. She was advised of the need for long term treatment and the importance of lifestyle modifications to improve her current health and to decrease her risk of future health problems.  AGREE: Multiple dietary modification options and treatment options  were discussed and Alejandra Crane agreed to follow the recommendations documented in the above note.  ARRANGE: Alejandra Crane was educated on the importance of frequent visits to treat obesity as outlined per CMS and USPSTF guidelines and agreed to schedule her next follow up appointment today.  Attestation Statements:   Reviewed by clinician on day of visit: allergies, medications, problem list, medical history, surgical history, family history, social history, and previous encounter notes.  IRanda Crane, am acting as transcriptionist for Marianna Payment, PA-C   I have reviewed the above documentation for accuracy and completeness, and I agree with the above. Alois Cliche, PA-C

## 2020-05-18 ENCOUNTER — Encounter (INDEPENDENT_AMBULATORY_CARE_PROVIDER_SITE_OTHER): Payer: Self-pay | Admitting: Physician Assistant

## 2020-05-18 ENCOUNTER — Encounter (INDEPENDENT_AMBULATORY_CARE_PROVIDER_SITE_OTHER): Payer: 59 | Admitting: Physician Assistant

## 2020-05-18 NOTE — Progress Notes (Signed)
Error

## 2020-05-31 ENCOUNTER — Ambulatory Visit (INDEPENDENT_AMBULATORY_CARE_PROVIDER_SITE_OTHER): Payer: 59 | Admitting: Physician Assistant

## 2020-05-31 ENCOUNTER — Other Ambulatory Visit: Payer: Self-pay

## 2020-05-31 ENCOUNTER — Encounter (INDEPENDENT_AMBULATORY_CARE_PROVIDER_SITE_OTHER): Payer: Self-pay | Admitting: Physician Assistant

## 2020-05-31 ENCOUNTER — Other Ambulatory Visit (INDEPENDENT_AMBULATORY_CARE_PROVIDER_SITE_OTHER): Payer: Self-pay | Admitting: Physician Assistant

## 2020-05-31 VITALS — Ht 62.0 in | Wt 175.0 lb

## 2020-05-31 DIAGNOSIS — Z9189 Other specified personal risk factors, not elsewhere classified: Secondary | ICD-10-CM | POA: Diagnosis not present

## 2020-05-31 DIAGNOSIS — Z6832 Body mass index (BMI) 32.0-32.9, adult: Secondary | ICD-10-CM

## 2020-05-31 DIAGNOSIS — E669 Obesity, unspecified: Secondary | ICD-10-CM

## 2020-05-31 DIAGNOSIS — E66811 Obesity, class 1: Secondary | ICD-10-CM

## 2020-05-31 DIAGNOSIS — E559 Vitamin D deficiency, unspecified: Secondary | ICD-10-CM | POA: Diagnosis not present

## 2020-05-31 DIAGNOSIS — I10 Essential (primary) hypertension: Secondary | ICD-10-CM

## 2020-05-31 MED ORDER — VITAMIN D (ERGOCALCIFEROL) 1.25 MG (50000 UNIT) PO CAPS
50000.0000 [IU] | ORAL_CAPSULE | ORAL | 0 refills | Status: DC
Start: 2020-05-31 — End: 2020-07-11

## 2020-05-31 NOTE — Progress Notes (Signed)
Chief Complaint:   OBESITY Alejandra Crane is here to discuss her progress with her obesity treatment plan along with follow-up of her obesity related diagnoses. Alejandra Crane is on keeping a food journal and adhering to recommended goals of 1200 calories and 85 grams of protein daily and states she is following her eating plan approximately 75% of the time. Alejandra Crane states she is walking for 45-60 minutes 1 time per week.  Today's visit was #: 14 Starting weight: 203 lbs Starting date: 06/22/2019 Today's weight: 175 lbs Today's date: 05/31/2020 Total lbs lost to date: 28 Total lbs lost since last in-office visit: 1  Interim History: [name] reports that she has been journaling more often. In reviewing her journal, most days she averaged between 30-50 grams of protein and many days she is skipping lunch.  Subjective:   1. Essential hypertension [name]'s blood pressure is controlled. She is on hydrochlorothiazide, and she denies dizziness or lightheadedness. She denies chest pain, and she is managed by her primary care provider.  2. Vitamin D deficiency Alejandra Crane is on Vit D weekly, and she denies nausea, vomiting, or muscle weakness. No outside activities currently.  3. At risk for heart disease Alejandra Crane is at a higher than average risk for cardiovascular disease due to obesity.   Assessment/Plan:   1. Essential hypertension Alejandra Crane is working on healthy weight loss and exercise to improve blood pressure control. We will watch for signs of hypotension as she continues her lifestyle modifications. Alejandra Crane will continue her medications, and will follow up with her primary care provider.  2. Vitamin D deficiency Low Vitamin D level contributes to fatigue and are associated with obesity, breast, and colon cancer. We will refill prescription Vitamin D for 90 days with no refills. [name] will follow-up for routine testing of Vitamin D, at least 2-3 times per year to avoid over-replacement.  - Vitamin D,  Ergocalciferol, (DRISDOL) 1.25 MG (50000 UNIT) CAPS capsule; Take 1 capsule (50,000 Units total) by mouth every 7 (seven) days.  Dispense: 12 capsule; Refill: 0  3. At risk for heart disease Alejandra Crane was given approximately 15 minutes of coronary artery disease prevention counseling today. She is 70 y.o. female and has risk factors for heart disease including obesity. We discussed intensive lifestyle modifications today with an emphasis on specific weight loss instructions and strategies.   Repetitive spaced learning was employed today to elicit superior memory formation and behavioral change.  4. Class 1 obesity with serious comorbidity and body mass index (BMI) of 32.0 to 32.9 in adult, unspecified obesity type [name] is currently in the action stage of change. As such, her goal is to continue with weight loss efforts. She has agreed to keeping a food journal and adhering to recommended goals of 1200 calories and 80 grams of protein.   Exercise goals: As is.  Behavioral modification strategies: increasing lean protein intake, no skipping meals and meal planning and cooking strategies.  Alejandra Crane has agreed to follow-up with our clinic in 3 weeks. She was informed of the importance of frequent follow-up visits to maximize her success with intensive lifestyle modifications for her multiple health conditions.   Objective:   Blood pressure (P) 94/60, pulse (P) 62, temperature (P) 98 F (36.7 C), height 5\' 2"  (1.575 m), weight 175 lb (79.4 kg), SpO2 (P) 98 %. Body mass index is 32.01 kg/m.  General: Cooperative, alert, well developed, in no acute distress. HEENT: Conjunctivae and lids unremarkable. Cardiovascular: Regular rhythm.  Lungs: Normal work of breathing. Neurologic:  No focal deficits.   Lab Results  Component Value Date   CREATININE 0.83 01/19/2020   BUN 21 01/19/2020   NA 144 01/19/2020   K 3.9 01/19/2020   CL 107 (H) 01/19/2020   CO2 26 01/19/2020   Lab Results  Component  Value Date   ALT 12 01/19/2020   AST 18 01/19/2020   ALKPHOS 83 01/19/2020   BILITOT 0.2 01/19/2020   Lab Results  Component Value Date   HGBA1C 5.9 (H) 01/19/2020   HGBA1C 5.8 (H) 06/22/2019   HGBA1C 5.6 03/15/2019   HGBA1C 5.6 03/16/2018   HGBA1C 5.6 09/19/2017   Lab Results  Component Value Date   INSULIN 8.7 01/19/2020   INSULIN 7.1 06/22/2019   Lab Results  Component Value Date   TSH 2.910 06/22/2019   Lab Results  Component Value Date   CHOL 195 01/19/2020   HDL 68 01/19/2020   LDLCALC 118 (H) 01/19/2020   TRIG 47 01/19/2020   CHOLHDL 2.9 01/19/2020   Lab Results  Component Value Date   WBC 7.6 03/15/2020   HGB 11.2 (L) 03/15/2020   HCT 35.4 03/15/2020   MCV 95.4 03/15/2020   PLT 278 03/15/2020   Lab Results  Component Value Date   IRON 49 08/22/2019   TIBC 246 (L) 08/22/2019   FERRITIN 77 06/22/2019   Attestation Statements:   Reviewed by clinician on day of visit: allergies, medications, problem list, medical history, surgical history, family history, social history, and previous encounter notes.   Trude Mcburney, am acting as transcriptionist for [employer], PA-C.  I have reviewed the above documentation for accuracy and completeness, and I agree with the above. -  *Alois Cliche, PA-C

## 2020-06-05 MED FILL — VIT D2 1.25 MG (50,000 UNIT: 1.25 MG | 84 days supply | Qty: 12 | Fill #0

## 2020-06-21 ENCOUNTER — Ambulatory Visit (INDEPENDENT_AMBULATORY_CARE_PROVIDER_SITE_OTHER): Payer: Medicare Other | Admitting: Physician Assistant

## 2020-06-21 ENCOUNTER — Ambulatory Visit
Admission: RE | Admit: 2020-06-21 | Discharge: 2020-06-21 | Disposition: A | Discharge status: Home / Self Care | Payer: 59 | Source: Ambulatory Visit | Attending: Nurse Practitioner | Admitting: Nurse Practitioner

## 2020-06-21 ENCOUNTER — Other Ambulatory Visit: Payer: Self-pay

## 2020-06-21 DIAGNOSIS — Z1231 Encounter for screening mammogram for malignant neoplasm of breast: Secondary | ICD-10-CM | POA: Diagnosis not present

## 2020-06-27 ENCOUNTER — Ambulatory Visit (INDEPENDENT_AMBULATORY_CARE_PROVIDER_SITE_OTHER): Payer: Medicare Other | Admitting: Physician Assistant

## 2020-07-11 ENCOUNTER — Encounter (INDEPENDENT_AMBULATORY_CARE_PROVIDER_SITE_OTHER): Payer: Self-pay | Admitting: Physician Assistant

## 2020-07-11 ENCOUNTER — Other Ambulatory Visit: Payer: Self-pay | Admitting: Physician Assistant

## 2020-07-11 ENCOUNTER — Other Ambulatory Visit: Payer: Self-pay

## 2020-07-11 ENCOUNTER — Ambulatory Visit (INDEPENDENT_AMBULATORY_CARE_PROVIDER_SITE_OTHER): Payer: 59 | Admitting: Physician Assistant

## 2020-07-11 VITALS — BP 146/75 | HR 60 | Temp 97.8°F | Ht 62.0 in | Wt 178.0 lb

## 2020-07-11 DIAGNOSIS — R7303 Prediabetes: Secondary | ICD-10-CM

## 2020-07-11 DIAGNOSIS — Z6832 Body mass index (BMI) 32.0-32.9, adult: Secondary | ICD-10-CM

## 2020-07-11 DIAGNOSIS — E559 Vitamin D deficiency, unspecified: Secondary | ICD-10-CM

## 2020-07-11 DIAGNOSIS — E669 Obesity, unspecified: Secondary | ICD-10-CM | POA: Diagnosis not present

## 2020-07-11 MED ORDER — VITAMIN D (ERGOCALCIFEROL) 1.25 MG (50000 UNIT) PO CAPS
50000.0000 [IU] | ORAL_CAPSULE | ORAL | 0 refills | Status: DC
Start: 2020-07-11 — End: 2020-08-02

## 2020-07-12 NOTE — Progress Notes (Signed)
Chief Complaint:   OBESITY Alejandra Crane is here to discuss her progress with her obesity treatment plan along with follow-up of her obesity related diagnoses. Alejandra Crane is on keeping a food journal and adhering to recommended goals of 1200 calories and 80 grams of protein daily and states she is following her eating plan approximately 25% of the time. Alejandra Crane states she is doing 0 minutes 0 times per week.  Today's visit was #: 15 Starting weight: 203 lbs Starting date: 06/22/2019 Today's weight: 178 lbs Today's date: 07/11/2020 Total lbs lost to date: 25 Total lbs lost since last in-office visit: 0  Interim History: Alejandra Crane hasn't been eating meals, she has been snacking. She states that she does not like to cook. She has not been journaling. She is not hungry throughout the day. She is not meal planning.  Subjective:   1. Pre-diabetes Alejandra Crane is not on medications, and she denies polyphagia.  2. Vitamin D deficiency Alejandra Crane is on weekly Vit D, and she is due for labs soon.  Assessment/Plan:   1. Pre-diabetes Alejandra Crane will continue to work on weight loss, exercise, and decreasing simple carbohydrates to help decrease the risk of diabetes.   2. Vitamin D deficiency Low Vitamin D level contributes to fatigue and are associated with obesity, breast, and colon cancer. We will refill prescription Vitamin D for 90 days with no refills. We will recheck labs at her next visit. Alejandra Crane will follow-up for routine testing of Vitamin D, at least 2-3 times per year to avoid over-replacement.  - Vitamin D, Ergocalciferol, (DRISDOL) 1.25 MG (50000 UNIT) CAPS capsule; Take 1 capsule (50,000 Units total) by mouth every 7 (seven) days.  Dispense: 12 capsule; Refill: 0  3. Class 1 obesity with serious comorbidity and body mass index (BMI) of 32.0 to 32.9 in adult, unspecified obesity type Alejandra Crane is currently in the action stage of change. As such, her goal is to continue with weight loss efforts. She has agreed  to the Category 2 Plan.   We will recheck fasting labs at her next visit.  Exercise goals: No exercise has been prescribed at this time.  Behavioral modification strategies: increasing lean protein intake and decreasing simple carbohydrates.  Alejandra Crane has agreed to follow-up with our clinic in 3 weeks. She was informed of the importance of frequent follow-up visits to maximize her success with intensive lifestyle modifications for her multiple health conditions.   Objective:   Blood pressure (!) 146/75, pulse 60, temperature 97.8 F (36.6 C), height 5\' 2"  (1.575 m), weight 178 lb (80.7 kg), SpO2 99 %. Body mass index is 32.56 kg/m.  General: Cooperative, alert, well developed, in no acute distress. HEENT: Conjunctivae and lids unremarkable. Cardiovascular: Regular rhythm.  Lungs: Normal work of breathing. Neurologic: No focal deficits.   Lab Results  Component Value Date   CREATININE 0.83 01/19/2020   BUN 21 01/19/2020   NA 144 01/19/2020   K 3.9 01/19/2020   CL 107 (H) 01/19/2020   CO2 26 01/19/2020   Lab Results  Component Value Date   ALT 12 01/19/2020   AST 18 01/19/2020   ALKPHOS 83 01/19/2020   BILITOT 0.2 01/19/2020   Lab Results  Component Value Date   HGBA1C 5.9 (H) 01/19/2020   HGBA1C 5.8 (H) 06/22/2019   HGBA1C 5.6 03/15/2019   HGBA1C 5.6 03/16/2018   HGBA1C 5.6 09/19/2017   Lab Results  Component Value Date   INSULIN 8.7 01/19/2020   INSULIN 7.1 06/22/2019   Lab  Results  Component Value Date   TSH 2.910 06/22/2019   Lab Results  Component Value Date   CHOL 195 01/19/2020   HDL 68 01/19/2020   LDLCALC 118 (H) 01/19/2020   TRIG 47 01/19/2020   CHOLHDL 2.9 01/19/2020   Lab Results  Component Value Date   WBC 7.6 03/15/2020   HGB 11.2 (L) 03/15/2020   HCT 35.4 03/15/2020   MCV 95.4 03/15/2020   PLT 278 03/15/2020   Lab Results  Component Value Date   IRON 49 08/22/2019   TIBC 246 (L) 08/22/2019   FERRITIN 77 06/22/2019    Obesity  Behavioral Intervention:   Approximately 15 minutes were spent on the discussion below.  ASK: We discussed the diagnosis of obesity with Alejandra Crane today and Alejandra Crane agreed to give [place] permission to discuss obesity behavioral modification therapy today.  ASSESS: Alejandra Crane has the diagnosis of obesity and her BMI today is 32.55. Shanique is in the action stage of change.   ADVISE: Alejandra Crane was educated on the multiple health risks of obesity as well as the benefit of weight loss to improve her health. She was advised of the need for long term treatment and the importance of lifestyle modifications to improve her current health and to decrease her risk of future health problems.  AGREE: Multiple dietary modification options and treatment options were discussed and Alejandra Crane agreed to follow the recommendations documented in the above note.  ARRANGE: Alejandra Crane was educated on the importance of frequent visits to treat obesity as outlined per CMS and USPSTF guidelines and agreed to schedule her next follow up appointment today.  Attestation Statements:   Reviewed by clinician on day of visit: allergies, medications, problem list, medical history, surgical history, family history, social history, and previous encounter notes.   Trude Mcburney, am acting as transcriptionist for [employer], PA-C.  I have reviewed the above documentation for accuracy and completeness, and I agree with the above. Alois Cliche, PA-C

## 2020-08-02 ENCOUNTER — Other Ambulatory Visit: Payer: Self-pay

## 2020-08-02 ENCOUNTER — Ambulatory Visit (INDEPENDENT_AMBULATORY_CARE_PROVIDER_SITE_OTHER): Payer: Medicare Other | Admitting: Adult Health

## 2020-08-02 ENCOUNTER — Other Ambulatory Visit (INDEPENDENT_AMBULATORY_CARE_PROVIDER_SITE_OTHER): Payer: Self-pay | Admitting: Physician Assistant

## 2020-08-02 ENCOUNTER — Ambulatory Visit (INDEPENDENT_AMBULATORY_CARE_PROVIDER_SITE_OTHER): Payer: Medicare Other | Admitting: Physician Assistant

## 2020-08-02 ENCOUNTER — Encounter (INDEPENDENT_AMBULATORY_CARE_PROVIDER_SITE_OTHER): Payer: Self-pay | Admitting: Physician Assistant

## 2020-08-02 VITALS — BP 144/84 | HR 57 | Temp 98.4°F | Ht 62.0 in | Wt 173.0 lb

## 2020-08-02 DIAGNOSIS — E559 Vitamin D deficiency, unspecified: Secondary | ICD-10-CM

## 2020-08-02 DIAGNOSIS — E7849 Other hyperlipidemia: Secondary | ICD-10-CM | POA: Diagnosis not present

## 2020-08-02 DIAGNOSIS — E669 Obesity, unspecified: Secondary | ICD-10-CM

## 2020-08-02 DIAGNOSIS — R7303 Prediabetes: Secondary | ICD-10-CM

## 2020-08-02 DIAGNOSIS — Z6834 Body mass index (BMI) 34.0-34.9, adult: Secondary | ICD-10-CM

## 2020-08-02 MED ORDER — VITAMIN D (ERGOCALCIFEROL) 1.25 MG (50000 UNIT) PO CAPS
50000.0000 [IU] | ORAL_CAPSULE | ORAL | 0 refills | Status: DC
Start: 2020-08-02 — End: 2020-08-02

## 2020-08-03 LAB — VITAMIN D 25 HYDROXY (VIT D DEFICIENCY, FRACTURES): Vit D, 25-Hydroxy: 90.3 ng/mL (ref 30.0–100.0)

## 2020-08-03 LAB — COMPREHENSIVE METABOLIC PANEL
ALT: 14 IU/L (ref 0–32)
AST: 21 IU/L (ref 0–40)
Albumin/Globulin Ratio: 1.3 (ref 1.2–2.2)
Albumin: 4.1 g/dL (ref 3.8–4.8)
Alkaline Phosphatase: 86 IU/L (ref 44–121)
BUN/Creatinine Ratio: 21 (ref 12–28)
BUN: 18 mg/dL (ref 8–27)
Bilirubin Total: 0.4 mg/dL (ref 0.0–1.2)
CO2: 26 mmol/L (ref 20–29)
Calcium: 9.6 mg/dL (ref 8.7–10.3)
Chloride: 106 mmol/L (ref 96–106)
Creatinine, Ser: 0.85 mg/dL (ref 0.57–1.00)
Globulin, Total: 3.1 g/dL (ref 1.5–4.5)
Glucose: 80 mg/dL (ref 65–99)
Potassium: 3.9 mmol/L (ref 3.5–5.2)
Sodium: 145 mmol/L — ABNORMAL HIGH (ref 134–144)
Total Protein: 7.2 g/dL (ref 6.0–8.5)
eGFR: 75 mL/min/{1.73_m2} (ref >59)

## 2020-08-03 LAB — LIPID PANEL
Chol/HDL Ratio: 2.8 ratio (ref 0.0–4.4)
Cholesterol, Total: 204 mg/dL — ABNORMAL HIGH (ref 100–199)
HDL: 74 mg/dL (ref >39)
LDL Chol Calc (NIH): 120 mg/dL — ABNORMAL HIGH (ref 0–99)
Triglycerides: 55 mg/dL (ref 0–149)
VLDL Cholesterol Cal: 10 mg/dL (ref 5–40)

## 2020-08-03 LAB — HEMOGLOBIN A1C
Est. average glucose Bld gHb Est-mCnc: 120 mg/dL
Hgb A1c MFr Bld: 5.8 % — ABNORMAL HIGH (ref 4.8–5.6)

## 2020-08-03 LAB — INSULIN, RANDOM: INSULIN: 5 u[IU]/mL (ref 2.6–24.9)

## 2020-08-08 NOTE — Progress Notes (Signed)
Chief Complaint:   OBESITY Alejandra Crane is here to discuss her progress with her obesity treatment plan along with follow-up of her obesity related diagnoses. Alejandra Crane is on the Category 2 Plan and states she is following her eating plan approximately 75% of the time. Alejandra Crane states she is walking 5,000 steps 3 times per week.   Today's visit was #: 16 Starting weight: 203 lbs Starting date: 06/22/2019 Today's weight: 173 lbs Today's date: 08/02/2020 Total lbs lost to date: 30 Total lbs lost since last in-office visit: 5  Interim History: Alejandra Crane is doing a better job with journaling. She reports having a couple of cheat meals or treats. Breakfast is her best meal of the day. She skipped a few meals.  Subjective:   1. Pre-diabetes Alejandra Crane is not on medications, and she denies polyphagia.  2. Other hyperlipidemia Alejandra Crane is on Crestor, and she denies myalgias or chest pain. She is due for labs.  3. Vitamin D deficiency Alejandra Crane is on Vit D weekly, and she denies nausea, vomiting, or muscle weakness. She is due for labs.  Assessment/Plan:   1. Pre-diabetes Alejandra Crane will continue to work on weight loss, exercise, and decreasing simple carbohydrates to help decrease the risk of diabetes. We will check labs today.  - Comprehensive metabolic panel - Hemoglobin A1c - Insulin, random  2. Other hyperlipidemia Cardiovascular risk and specific lipid/LDL goals reviewed.  We discussed several lifestyle modifications today. We will check labs today. Alejandra Crane will continue to work on diet, exercise and weight loss efforts. Orders and follow up as documented in patient record.   Counseling Intensive lifestyle modifications are the first line treatment for this issue. . Dietary changes: Increase soluble fiber. Decrease simple carbohydrates. . Exercise changes: Moderate to vigorous-intensity aerobic activity 150 minutes per week if tolerated. . Lipid-lowering medications: see documented in medical  record.  - Lipid panel  3. Vitamin D deficiency Low Vitamin D level contributes to fatigue and are associated with obesity, breast, and colon cancer. We will check labs today, and we will refill prescription Vitamin D 50,000 IU every week #4 for 1 month. Alejandra Crane will follow-up for routine testing of Vitamin D, at least 2-3 times per year to avoid over-replacement.  - VITAMIN D 25 Hydroxy (Vit-D Deficiency, Fractures)  4. Class 1 obesity with serious comorbidity and body mass index (BMI) of 34.0 to 34.9 in adult, unspecified obesity type Alejandra Crane is currently in the action stage of change. As such, her goal is to continue with weight loss efforts. She has agreed to keeping a food journal and adhering to recommended goals of 1200 calories and 90 grams of protein daily.   Exercise goals: As is.  Behavioral modification strategies: no skipping meals.  Alejandra Crane has agreed to follow-up with our clinic in 3 weeks. She was informed of the importance of frequent follow-up visits to maximize her success with intensive lifestyle modifications for her multiple health conditions.   Alejandra Crane was informed we would discuss her lab results at her next visit unless there is a critical issue that needs to be addressed sooner. Alejandra Crane agreed to keep her next visit at the agreed upon time to discuss these results.  Objective:   Blood pressure (!) 144/84, pulse (!) 57, temperature 98.4 F (36.9 C), height 5\' 2"  (1.575 m), weight 173 lb (78.5 kg), SpO2 99 %. Body mass index is 31.64 kg/m.  General: Cooperative, alert, well developed, in no acute distress. HEENT: Conjunctivae and lids unremarkable. Cardiovascular: Regular rhythm.  Lungs: Normal work of breathing. Neurologic: No focal deficits.   Lab Results  Component Value Date   CREATININE 0.85 08/02/2020   BUN 18 08/02/2020   NA 145 (H) 08/02/2020   K 3.9 08/02/2020   CL 106 08/02/2020   CO2 26 08/02/2020   Lab Results  Component Value Date   ALT 14  08/02/2020   AST 21 08/02/2020   ALKPHOS 86 08/02/2020   BILITOT 0.4 08/02/2020   Lab Results  Component Value Date   HGBA1C 5.8 (H) 08/02/2020   HGBA1C 5.9 (H) 01/19/2020   HGBA1C 5.8 (H) 06/22/2019   HGBA1C 5.6 03/15/2019   HGBA1C 5.6 03/16/2018   Lab Results  Component Value Date   INSULIN 5.0 08/02/2020   INSULIN 8.7 01/19/2020   INSULIN 7.1 06/22/2019   Lab Results  Component Value Date   TSH 2.910 06/22/2019   Lab Results  Component Value Date   CHOL 204 (H) 08/02/2020   HDL 74 08/02/2020   LDLCALC 120 (H) 08/02/2020   TRIG 55 08/02/2020   CHOLHDL 2.8 08/02/2020   Lab Results  Component Value Date   WBC 7.6 03/15/2020   HGB 11.2 (L) 03/15/2020   HCT 35.4 03/15/2020   MCV 95.4 03/15/2020   PLT 278 03/15/2020   Lab Results  Component Value Date   IRON 49 08/22/2019   TIBC 246 (L) 08/22/2019   FERRITIN 77 06/22/2019    Obesity Behavioral Intervention:   Approximately 15 minutes were spent on the discussion below.  ASK: We discussed the diagnosis of obesity with Alejandra Crane today and Alejandra Crane agreed to give [place] permission to discuss obesity behavioral modification therapy today.  ASSESS: Alejandra Crane has the diagnosis of obesity and her BMI today is 31.63. Alejandra Crane is in the action stage of change.   ADVISE: Alejandra Crane was educated on the multiple health risks of obesity as well as the benefit of weight loss to improve her health. She was advised of the need for long term treatment and the importance of lifestyle modifications to improve her current health and to decrease her risk of future health problems.  AGREE: Multiple dietary modification options and treatment options were discussed and Alejandra Crane agreed to follow the recommendations documented in the above note.  ARRANGE: Alejandra Crane was educated on the importance of frequent visits to treat obesity as outlined per CMS and USPSTF guidelines and agreed to schedule her next follow up appointment today.  Attestation  Statements:   Reviewed by clinician on day of visit: allergies, medications, problem list, medical history, surgical history, family history, social history, and previous encounter notes.   Alejandra Crane, am acting as transcriptionist for [employer], PA-C.  I have reviewed the above documentation for accuracy and completeness, and I agree with the above. -  *Alejandra Cliche, PA-C

## 2020-08-21 ENCOUNTER — Other Ambulatory Visit (HOSPITAL_COMMUNITY): Payer: Self-pay

## 2020-08-21 ENCOUNTER — Other Ambulatory Visit: Payer: Self-pay | Admitting: Nurse Practitioner

## 2020-08-21 DIAGNOSIS — E782 Mixed hyperlipidemia: Secondary | ICD-10-CM

## 2020-08-21 DIAGNOSIS — I10 Essential (primary) hypertension: Secondary | ICD-10-CM

## 2020-08-21 DIAGNOSIS — F325 Major depressive disorder, single episode, in full remission: Secondary | ICD-10-CM

## 2020-08-21 MED ORDER — VALACYCLOVIR HCL 500 MG PO TABS
500.0000 mg | ORAL_TABLET | Freq: Every day | ORAL | 1 refills | Status: DC
  Filled 2020-08-21: qty 90, 90d supply, fill #0

## 2020-08-22 ENCOUNTER — Other Ambulatory Visit (HOSPITAL_COMMUNITY): Payer: Self-pay

## 2020-08-22 MED ORDER — ROSUVASTATIN CALCIUM 5 MG PO TABS
5.0000 mg | ORAL_TABLET | Freq: Every day | ORAL | 1 refills | Status: DC
  Filled 2020-08-22: qty 90, 90d supply, fill #0
  Filled 2020-12-02: qty 90, 90d supply, fill #1

## 2020-08-22 MED ORDER — SERTRALINE HCL 100 MG PO TABS
1.0000 | ORAL_TABLET | Freq: Every day | ORAL | 1 refills | Status: DC
  Filled 2020-08-22: qty 90, 90d supply, fill #0
  Filled 2020-12-02: qty 90, 90d supply, fill #1

## 2020-08-22 MED ORDER — HYDROCHLOROTHIAZIDE 25 MG PO TABS
1.0000 | ORAL_TABLET | Freq: Every day | ORAL | 1 refills | Status: DC
  Filled 2020-08-22: qty 90, 90d supply, fill #0
  Filled 2020-12-02: qty 90, 90d supply, fill #1

## 2020-08-23 ENCOUNTER — Ambulatory Visit (INDEPENDENT_AMBULATORY_CARE_PROVIDER_SITE_OTHER): Payer: 59 | Admitting: Physician Assistant

## 2020-08-23 ENCOUNTER — Encounter (INDEPENDENT_AMBULATORY_CARE_PROVIDER_SITE_OTHER): Payer: Self-pay | Admitting: Physician Assistant

## 2020-08-23 ENCOUNTER — Other Ambulatory Visit: Payer: Self-pay

## 2020-08-23 VITALS — BP 93/61 | HR 67 | Temp 97.9°F | Ht 62.0 in | Wt 174.0 lb

## 2020-08-23 DIAGNOSIS — E559 Vitamin D deficiency, unspecified: Secondary | ICD-10-CM | POA: Diagnosis not present

## 2020-08-23 DIAGNOSIS — Z9189 Other specified personal risk factors, not elsewhere classified: Secondary | ICD-10-CM | POA: Diagnosis not present

## 2020-08-23 DIAGNOSIS — Z6834 Body mass index (BMI) 34.0-34.9, adult: Secondary | ICD-10-CM | POA: Diagnosis not present

## 2020-08-23 DIAGNOSIS — E669 Obesity, unspecified: Secondary | ICD-10-CM

## 2020-08-23 DIAGNOSIS — R7303 Prediabetes: Secondary | ICD-10-CM

## 2020-08-28 NOTE — Progress Notes (Signed)
Chief Complaint:   OBESITY Alejandra Crane is here to discuss her progress with her obesity treatment plan along with follow-up of her obesity related diagnoses. Alejandra Crane is on keeping a food journal and adhering to recommended goals of 1200 calories and 90 grams of protein daily and states she is following her eating plan approximately 0% of the time. Alejandra Crane states she is at the gym exercising for 30 minutes 1 time per week.  Today's visit was #: 17 Starting weight: 203 lbs Starting date: 06/22/2019 Today's weight: 174 lbs Today's date: 08/23/2020 Total lbs lost to date: 29 Total lbs lost since last in-office visit: 0  Interim History: Alejandra Crane reports that she has not been journaling. She has not been making meals but has been snacking more often instead.  Subjective:   1. Vitamin D deficiency Alejandra Crane is on Vit D weekly, and she is tolerating it well. Her last Vit D level was 90.3. I discussed labs with the patient today.  2. Pre-diabetes Alejandra Crane's last A1c was 5.8, and she is not on medications. I discussed labs with the patient today.  3. At risk for diabetes mellitus Alejandra Crane is at higher than average risk for developing diabetes due to obesity.   Assessment/Plan:   1. Vitamin D deficiency Low Vitamin D level contributes to fatigue and are associated with obesity, breast, and colon cancer. Alejandra Crane agreed to change to OTC Vitamin D 1,000 units daily and will follow-up for routine testing of Vitamin D, at least 2-3 times per year to avoid over-replacement.  2. Pre-diabetes Alejandra Crane will continue with her meal plan, and will continue to work on weight loss, exercise, and decreasing simple carbohydrates to help decrease the risk of diabetes.   3. At risk for diabetes mellitus Alejandra Crane was given approximately 15 minutes of diabetes education and counseling today. We discussed intensive lifestyle modifications today with an emphasis on weight loss as well as increasing exercise and decreasing  simple carbohydrates in her diet. We also reviewed medication options with an emphasis on risk versus benefit of those discussed.   Repetitive spaced learning was employed today to elicit superior memory formation and behavioral change.  4. Class 1 obesity with serious comorbidity and body mass index (BMI) of 34.0 to 34.9 in adult, unspecified obesity type Alejandra Crane is currently in the action stage of change. As such, her goal is to continue with weight loss efforts. She has agreed to keeping a food journal and adhering to recommended goals of 1200 calories and 90 grams of protein daily.   Exercise goals: As is.  Behavioral modification strategies: increasing lean protein intake, no skipping meals and meal planning and cooking strategies.  Alejandra Crane has agreed to follow-up with our clinic in 3 weeks. She was informed of the importance of frequent follow-up visits to maximize her success with intensive lifestyle modifications for her multiple health conditions.   Objective:   Blood pressure 93/61, pulse 67, temperature 97.9 F (36.6 C), height 5\' 2"  (1.575 m), weight 174 lb (78.9 kg), SpO2 98 %. Body mass index is 31.83 kg/m.  General: Cooperative, alert, well developed, in no acute distress. HEENT: Conjunctivae and lids unremarkable. Cardiovascular: Regular rhythm.  Lungs: Normal work of breathing. Neurologic: No focal deficits.   Lab Results  Component Value Date   CREATININE 0.85 08/02/2020   BUN 18 08/02/2020   NA 145 (H) 08/02/2020   K 3.9 08/02/2020   CL 106 08/02/2020   CO2 26 08/02/2020   Lab Results  Component Value  Date   ALT 14 08/02/2020   AST 21 08/02/2020   ALKPHOS 86 08/02/2020   BILITOT 0.4 08/02/2020   Lab Results  Component Value Date   HGBA1C 5.8 (H) 08/02/2020   HGBA1C 5.9 (H) 01/19/2020   HGBA1C 5.8 (H) 06/22/2019   HGBA1C 5.6 03/15/2019   HGBA1C 5.6 03/16/2018   Lab Results  Component Value Date   INSULIN 5.0 08/02/2020   INSULIN 8.7 01/19/2020    INSULIN 7.1 06/22/2019   Lab Results  Component Value Date   TSH 2.910 06/22/2019   Lab Results  Component Value Date   CHOL 204 (H) 08/02/2020   HDL 74 08/02/2020   LDLCALC 120 (H) 08/02/2020   TRIG 55 08/02/2020   CHOLHDL 2.8 08/02/2020   Lab Results  Component Value Date   WBC 7.6 03/15/2020   HGB 11.2 (L) 03/15/2020   HCT 35.4 03/15/2020   MCV 95.4 03/15/2020   PLT 278 03/15/2020   Lab Results  Component Value Date   IRON 49 08/22/2019   TIBC 246 (L) 08/22/2019   FERRITIN 77 06/22/2019   Attestation Statements:   Reviewed by clinician on day of visit: allergies, medications, problem list, medical history, surgical history, family history, social history, and previous encounter notes.   Trude Mcburney, am acting as transcriptionist for [employer], PA-C.  I have reviewed the above documentation for accuracy and completeness, and I agree with the above. Alois Cliche, PA-C

## 2020-09-11 ENCOUNTER — Ambulatory Visit: Payer: Medicare Other | Admitting: Nurse Practitioner

## 2020-09-12 ENCOUNTER — Other Ambulatory Visit: Payer: Self-pay

## 2020-09-12 ENCOUNTER — Encounter (INDEPENDENT_AMBULATORY_CARE_PROVIDER_SITE_OTHER): Payer: Self-pay | Admitting: Physician Assistant

## 2020-09-12 ENCOUNTER — Ambulatory Visit (INDEPENDENT_AMBULATORY_CARE_PROVIDER_SITE_OTHER): Payer: 59 | Admitting: Physician Assistant

## 2020-09-12 VITALS — BP 127/86 | HR 58 | Temp 98.2°F | Ht 62.0 in | Wt 179.0 lb

## 2020-09-12 DIAGNOSIS — Z6834 Body mass index (BMI) 34.0-34.9, adult: Secondary | ICD-10-CM

## 2020-09-12 DIAGNOSIS — E669 Obesity, unspecified: Secondary | ICD-10-CM | POA: Diagnosis not present

## 2020-09-12 DIAGNOSIS — E7849 Other hyperlipidemia: Secondary | ICD-10-CM | POA: Diagnosis not present

## 2020-09-12 NOTE — Progress Notes (Signed)
Chief Complaint:   OBESITY Alejandra Crane is here to discuss her progress with her obesity treatment plan along with follow-up of her obesity related diagnoses. Alejandra Crane is on keeping a food journal and adhering to recommended goals of 1200 calories and 90 grams of protein daily and states she is following her eating plan approximately 25% of the time. Alejandra Crane states she is doing 0 minutes 0 times per week.  Today's visit was #: 18 Starting weight: 203 lbs Starting date: 06/22/2019 Today's weight: 179 lbs Today's date: 09/12/2020 Total lbs lost to date: 24 Total lbs lost since last in-office visit: 0  Interim History: Alejandra Crane is not meal planning. She reports that she is feeling "lazy and depressed". She does well with breakfast but not lunch and dinner.  Subjective:   1. Other hyperlipidemia Alejandra Crane is on Crestor, and she denies chest pain or myalgias.  Assessment/Plan:   1. Other hyperlipidemia Cardiovascular risk and specific lipid/LDL goals reviewed. We discussed several lifestyle modifications today. Bernyce will continue her medications, and will continue to work on diet, exercise and weight loss efforts. We will monitor her lipids. Orders and follow up as documented in patient record.   Counseling Intensive lifestyle modifications are the first line treatment for this issue. . Dietary changes: Increase soluble fiber. Decrease simple carbohydrates. . Exercise changes: Moderate to vigorous-intensity aerobic activity 150 minutes per week if tolerated. . Lipid-lowering medications: see documented in medical record.  2. Class 1 obesity with serious comorbidity and body mass index (BMI) of 34.0 to 34.9 in adult, unspecified obesity type Alejandra Crane is currently in the action stage of change. As such, her goal is to continue with weight loss efforts. She has agreed to keeping a food journal and adhering to recommended goals of 1200 calories and 90 grams of protein daily.   Exercise goals: No  exercise has been prescribed at this time.  Behavioral modification strategies: meal planning and cooking strategies and keeping healthy foods in the home.  Alejandra Crane has agreed to follow-up with our clinic in 2 weeks. She was informed of the importance of frequent follow-up visits to maximize her success with intensive lifestyle modifications for her multiple health conditions.   Objective:   Blood pressure 127/86, pulse (!) 58, temperature 98.2 F (36.8 C), height 5\' 2"  (1.575 m), weight 179 lb (81.2 kg), SpO2 99 %. Body mass index is 32.74 kg/m.  General: Cooperative, alert, well developed, in no acute distress. HEENT: Conjunctivae and lids unremarkable. Cardiovascular: Regular rhythm.  Lungs: Normal work of breathing. Neurologic: No focal deficits.   Lab Results  Component Value Date   CREATININE 0.85 08/02/2020   BUN 18 08/02/2020   NA 145 (H) 08/02/2020   K 3.9 08/02/2020   CL 106 08/02/2020   CO2 26 08/02/2020   Lab Results  Component Value Date   ALT 14 08/02/2020   AST 21 08/02/2020   ALKPHOS 86 08/02/2020   BILITOT 0.4 08/02/2020   Lab Results  Component Value Date   HGBA1C 5.8 (H) 08/02/2020   HGBA1C 5.9 (H) 01/19/2020   HGBA1C 5.8 (H) 06/22/2019   HGBA1C 5.6 03/15/2019   HGBA1C 5.6 03/16/2018   Lab Results  Component Value Date   INSULIN 5.0 08/02/2020   INSULIN 8.7 01/19/2020   INSULIN 7.1 06/22/2019   Lab Results  Component Value Date   TSH 2.910 06/22/2019   Lab Results  Component Value Date   CHOL 204 (H) 08/02/2020   HDL 74 08/02/2020   LDLCALC  120 (H) 08/02/2020   TRIG 55 08/02/2020   CHOLHDL 2.8 08/02/2020   Lab Results  Component Value Date   WBC 7.6 03/15/2020   HGB 11.2 (L) 03/15/2020   HCT 35.4 03/15/2020   MCV 95.4 03/15/2020   PLT 278 03/15/2020   Lab Results  Component Value Date   IRON 49 08/22/2019   TIBC 246 (L) 08/22/2019   FERRITIN 77 06/22/2019   Attestation Statements:   Reviewed by clinician on day of visit:  allergies, medications, problem list, medical history, surgical history, family history, social history, and previous encounter notes.  Time spent on visit including pre-visit chart review and post-visit care and charting was 30 minutes.    Trude Mcburney, am acting as transcriptionist for [employer], PA-C.  I have reviewed the above documentation for accuracy and completeness, and I agree with the above. Alois Cliche, PA-C

## 2020-09-13 ENCOUNTER — Ambulatory Visit: Payer: 59 | Admitting: Nurse Practitioner

## 2020-09-13 ENCOUNTER — Encounter: Payer: Self-pay | Admitting: Nurse Practitioner

## 2020-09-13 VITALS — BP 120/80 | HR 57 | Temp 96.6°F | Ht 62.0 in | Wt 179.4 lb

## 2020-09-13 DIAGNOSIS — E6609 Other obesity due to excess calories: Secondary | ICD-10-CM | POA: Diagnosis not present

## 2020-09-13 DIAGNOSIS — F325 Major depressive disorder, single episode, in full remission: Secondary | ICD-10-CM | POA: Diagnosis not present

## 2020-09-13 DIAGNOSIS — M19042 Primary osteoarthritis, left hand: Secondary | ICD-10-CM | POA: Diagnosis not present

## 2020-09-13 DIAGNOSIS — R739 Hyperglycemia, unspecified: Secondary | ICD-10-CM | POA: Diagnosis not present

## 2020-09-13 DIAGNOSIS — I10 Essential (primary) hypertension: Secondary | ICD-10-CM

## 2020-09-13 DIAGNOSIS — E782 Mixed hyperlipidemia: Secondary | ICD-10-CM

## 2020-09-13 DIAGNOSIS — L853 Xerosis cutis: Secondary | ICD-10-CM

## 2020-09-13 DIAGNOSIS — Z6832 Body mass index (BMI) 32.0-32.9, adult: Secondary | ICD-10-CM

## 2020-09-13 DIAGNOSIS — D649 Anemia, unspecified: Secondary | ICD-10-CM | POA: Diagnosis not present

## 2020-09-13 NOTE — Patient Instructions (Signed)
Call [phone] to schedule bone density.

## 2020-09-13 NOTE — Progress Notes (Signed)
Careteam: Patient Care Team: Alejandra Seller, NP as PCP - General (Geriatric Medicine) Drema Dallas, DO as Consulting Physician (Neurology)  PLACE OF SERVICE:  [facility]  Advanced Directive information Does Patient Have a Medical Advance Directive?: Yes, Type of Advance Directive: Healthcare Power of [place];Living will, Does patient want to make changes to medical advance directive?: No - Patient declined  Allergies  Allergen Reactions  . Mobic [Meloxicam]     GI bleed    Chief Complaint  Patient presents with  . Medical Management of Chronic Issues    6 month follow up. Patient has concerns about arthritis in hands. She uses ice,but it has become very bad. Wants to know of other alternatives. Patient also states that she gets patches of dry skin on legs at times.     HPI: Patient is a 70 y.o. female for routine follow up.   Obesity- continues with weight and wellness program every 2 weeks.  Gained 5 lbs since last week. Got relaxed.   Hyperlipidemia- on crestor 5 mg daily.   Depression in remission- controlled on zoloft.  Trying to stay positive, focused, aging all contributing.   Has appt with a personal trainer.  Trying to keep moving until she dies.  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 She keeps moving.  Worse on left hand thumb. She is left handed.    Review of Systems:  Review of Systems  Constitutional: Negative for chills, fever and weight loss.  HENT: Negative for tinnitus.   Respiratory: Negative for cough, sputum production and shortness of breath.   Cardiovascular: Negative for chest pain, palpitations and leg swelling.  Gastrointestinal: Negative for abdominal pain, constipation, diarrhea and heartburn.  Genitourinary: Negative for dysuria, frequency and urgency.  Musculoskeletal: Positive for joint pain. Negative for back pain, falls and myalgias.  Skin: Negative.        Dry skin  Neurological: Negative for  dizziness and headaches.  Psychiatric/Behavioral: Negative for depression and memory loss. The patient does not have insomnia.     Past Medical History:  Diagnosis Date  . Anemia   . Anxiety   . Arthritis   . Asthma   . Depression   . Diverticulosis    noted on colonscopy   . Elevated cholesterol   . GERD (gastroesophageal reflux disease)   . H/O mammogram    2015, 2016  . Hepatitis   . 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 High blood pressure   . History of esophagogastroduodenoscopy (EGD)    2014, 2016  . History of PFTs 10/26/2012  . Lower GI bleed 01/19/2015   Related to mobic 15 mg   . Mitral valve prolapse 09/06/1996  . Osteoarthritis of both knees   . Pneumonia    Past Surgical History:  Procedure Laterality Date  . BREAST EXCISIONAL BIOPSY Left    benign  . BREAST SURGERY  05/20/2007   [facility], Dr.William Purkert   . COLONOSCOPY     2014, 2016 Dr.Bajani Manalo, Dr.Mahmood Abedi   . KNEE ARTHROSCOPY Right    Social History:   reports that she quit smoking about 34 years ago. Her smoking use included cigarettes. She has a 2.50 pack-year smoking history. She has never used smokeless tobacco. She reports current alcohol use. She reports that she does not use drugs.  Family History  Problem Relation Age of Onset  . Heart attack Mother   . CVA Mother   . Diabetes Mother   .  Congestive Heart Failure Mother   . Stroke Mother   . Heart disease Mother   . Hyperlipidemia Mother   . Hypertension Mother   . Depression Mother   . Obesity Mother   . Lung cancer Sister 64  . Stomach cancer Brother   . Neurologic Disorder Son   . Colon cancer Niece   . Diabetes Maternal Grandmother     Medications: Patient's Medications  New Prescriptions   No medications on file  Previous Medications   HYDROCHLOROTHIAZIDE (HYDRODIURIL) 25 MG TABLET    TAKE 1 TABLET BY MOUTH DAILY.   ROSUVASTATIN (CRESTOR) 5 MG TABLET    Take 1 tablet (5 mg total) by mouth at bedtime.   SERTRALINE  (ZOLOFT) 100 MG TABLET    TAKE 1 TABLET BY MOUTH DAILY.   VALACYCLOVIR (VALTREX) 500 MG TABLET    Take 1 tablet (500 mg total) by mouth daily.  Modified Medications   No medications on file  Discontinued Medications   VALACYCLOVIR (VALTREX) 500 MG TABLET    Take 1 tablet (500 mg total) by mouth daily.    Physical Exam:  Vitals:   09/13/20 1019  BP: 120/80  Pulse: (!) 57  Temp: (!) 96.6 F (35.9 C)  TempSrc: Temporal  SpO2: 97%  Weight: 179 lb 6.4 oz (81.4 kg)  Height: 5\' 2"  (1.575 m)   Body mass index is 32.81 kg/m. Wt Readings from Last 3 Encounters:  09/13/20 179 lb 6.4 oz (81.4 kg)  09/12/20 179 lb (81.2 kg)  08/23/20 174 lb (78.9 kg)    Physical Exam Constitutional:      General: She is not in acute distress.    Appearance: She is well-developed. She is not diaphoretic.  HENT:     Head: Normocephalic and atraumatic.     Mouth/Throat:     Pharynx: No oropharyngeal exudate.  Eyes:     Conjunctiva/sclera: Conjunctivae normal.     Pupils: Pupils are equal, round, and reactive to light.  Cardiovascular:     Rate and Rhythm: Normal rate and regular rhythm.     Heart sounds: Normal heart sounds.  Pulmonary:     Effort: Pulmonary effort is normal.     Breath sounds: Normal breath sounds.  Abdominal:     General: Bowel sounds are normal.     Palpations: Abdomen is soft.  Musculoskeletal:        General: No tenderness.     Cervical back: Normal range of motion and neck supple.  Skin:    General: Skin is warm and dry.  Neurological:     Mental Status: She is alert and oriented to person, place, and time.     Labs reviewed: Basic Metabolic Panel: Recent Labs    01/19/20 0930 08/02/20 1058  NA 144 145*  K 3.9 3.9  CL 107* 106  CO2 26 26  GLUCOSE 83 80  BUN 21 18  CREATININE 0.83 0.85  CALCIUM 9.3 9.6   Liver Function Tests: Recent Labs    01/19/20 0930 08/02/20 1058  AST 18 21  ALT 12 14  ALKPHOS 83 86  BILITOT 0.2 0.4  PROT 6.9 7.2  ALBUMIN  4.0 4.1   No results for input(s): LIPASE, AMYLASE in the last 8760 hours. No results for input(s): AMMONIA in the last 8760 hours. CBC: Recent Labs    03/15/20 1338  WBC 7.6  NEUTROABS 4,203  HGB 11.2*  HCT 35.4  MCV 95.4  PLT 278   Lipid Panel: Recent Labs  01/19/20 0930 08/02/20 1058  CHOL 195 204*  HDL 68 74  LDLCALC 118* 120*  TRIG 47 55  CHOLHDL 2.9 2.8   TSH: No results for input(s): TSH in the last 8760 hours. A1C: Lab Results  Component Value Date   HGBA1C 5.8 (H) 08/02/2020     Assessment/Plan 1. Depression, major, single episode, complete remission (HCC) Controlled on zoloft.   2. Essential hypertension Well controlled on current regimen of diet modifications with medications  3. Anemia, unspecified type Will need to follow up cbc with next OV  4. Mixed hyperlipidemia -stable, continues on crestor with dietary modifications.   5. Class 1 obesity due to excess calories with serious comorbidity and body mass index (BMI) of 32.0 to 32.9 in adult Ongoing, continues with weight management.   6. Hyperglycemia -a1c stable, continue dietary modifications with increase in activity.  7. Primary osteoarthritis of left hand -she is left handed. She uses ice with good relief. Can use OTC Voltaren gel PRN and tylenol PRN if needed.  8. Dry skin Stable with OTC lotions   Next appt: 6 months.  Alejandra Crane. Biagio Borg  [facility] [phone]

## 2020-09-26 ENCOUNTER — Ambulatory Visit: Payer: Medicare Other | Admitting: Nurse Practitioner

## 2020-09-28 ENCOUNTER — Other Ambulatory Visit: Payer: Self-pay

## 2020-09-28 ENCOUNTER — Ambulatory Visit (INDEPENDENT_AMBULATORY_CARE_PROVIDER_SITE_OTHER): Payer: Medicare Other | Admitting: Physician Assistant

## 2020-09-28 VITALS — BP 122/73 | HR 58 | Temp 98.9°F | Ht 62.0 in | Wt 176.0 lb

## 2020-09-28 DIAGNOSIS — E669 Obesity, unspecified: Secondary | ICD-10-CM | POA: Diagnosis not present

## 2020-09-28 DIAGNOSIS — Z6834 Body mass index (BMI) 34.0-34.9, adult: Secondary | ICD-10-CM | POA: Diagnosis not present

## 2020-09-28 DIAGNOSIS — Z9189 Other specified personal risk factors, not elsewhere classified: Secondary | ICD-10-CM

## 2020-09-28 DIAGNOSIS — E559 Vitamin D deficiency, unspecified: Secondary | ICD-10-CM

## 2020-09-28 DIAGNOSIS — R7303 Prediabetes: Secondary | ICD-10-CM

## 2020-10-03 ENCOUNTER — Telehealth: Payer: Self-pay

## 2020-10-03 ENCOUNTER — Encounter: Payer: Self-pay | Admitting: Nurse Practitioner

## 2020-10-03 ENCOUNTER — Other Ambulatory Visit: Payer: Self-pay

## 2020-10-03 ENCOUNTER — Ambulatory Visit (INDEPENDENT_AMBULATORY_CARE_PROVIDER_SITE_OTHER): Payer: 59 | Admitting: Nurse Practitioner

## 2020-10-03 DIAGNOSIS — Z0000 Encounter for general adult medical examination without abnormal findings: Secondary | ICD-10-CM | POA: Diagnosis not present

## 2020-10-03 NOTE — Telephone Encounter (Signed)
Ms. Alejandra Crane, Alejandra Crane are scheduled for a virtual visit with your provider today.    Just as we do with appointments in the office, we must obtain your consent to participate.  Your consent will be active for this visit and any virtual visit you may have with one of our providers in the next 365 days.    If you have a MyChart account, I can also send a copy of this consent to you electronically.  All virtual visits are billed to your insurance company just like a traditional visit in the office.  As this is a virtual visit, video technology does not allow for your provider to perform a traditional examination.  This may limit your provider's ability to fully assess your condition.  If your provider identifies any concerns that need to be evaluated in person or the need to arrange testing such as labs, EKG, etc, we will make arrangements to do so.    Although advances in technology are sophisticated, we cannot ensure that it will always work on either your end or our end.  If the connection with a video visit is poor, we may have to switch to a telephone visit.  With either a video or telephone visit, we are not always able to ensure that we have a secure connection.   I need to obtain your verbal consent now.   Are you willing to proceed with your visit today?   Alejandra Crane has provided verbal consent on 10/03/2020 for a virtual visit (video or telephone).   Elveria Royals, CMA 10/03/2020  10:41 AM

## 2020-10-03 NOTE — Progress Notes (Signed)
Subjective:   Alejandra Crane is a 70 y.o. female who presents for Medicare Annual (Subsequent) preventive examination.  Review of Systems     Cardiac Risk Factors include: advanced age (>3955men, 29>65 women);obesity (BMI >30kg/m2);family history of premature cardiovascular disease;hypertension;dyslipidemia     Objective:    Today's Vitals   10/03/20 1043  PainSc: 5    There is no height or weight on file to calculate BMI.  Advanced Directives 10/03/2020 09/13/2020 03/15/2020 08/27/2019 08/27/2019 08/22/2019 05/13/2019  Does Patient Have a Medical Advance Directive? Yes Yes Yes Yes Yes No Yes  Type of Estate agentAdvance Directive Healthcare Power of [place];Living will Healthcare Power of [place];Living will Healthcare Power of Attorney Living will;Healthcare Power of State Street Corporationttorney Healthcare Power of [place];Living will - -  Does patient want to make changes to medical advance directive? No - Patient declined No - Patient declined No - Patient declined - No - Patient declined - -  Copy of Healthcare Power of Attorney in Chart? Yes - validated most recent copy scanned in chart (See row information) Yes - validated most recent copy scanned in chart (See row information) Yes - validated most recent copy scanned in chart (See row information) Yes - validated most recent copy scanned in chart (See row information) Yes - validated most recent copy scanned in chart (See row information) - -  Would patient like information on creating a medical advance directive? - - - - - No - Patient declined -    Current Medications (verified) Outpatient Encounter Medications as of 10/03/2020  Medication Sig  . hydrochlorothiazide (HYDRODIURIL) 25 MG tablet TAKE 1 TABLET BY MOUTH DAILY.  . rosuvastatin (CRESTOR) 5 MG tablet Take 1 tablet (5 mg total) by mouth at bedtime.  . sertraline (ZOLOFT) 100 MG tablet TAKE 1 TABLET BY MOUTH DAILY.  . valACYclovir (VALTREX) 500 MG tablet Take 1 tablet (500 mg total) by mouth daily.    No facility-administered encounter medications on file as of 10/03/2020.    Allergies (verified) Mobic [meloxicam]   History: Past Medical History:  Diagnosis Date  . Anemia   . Anxiety   . Arthritis   . Asthma   . Depression   . Diverticulosis    noted on colonscopy   . Elevated cholesterol   . GERD (gastroesophageal reflux disease)   . H/O mammogram    2015, 2016  . Hepatitis   . 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 High blood pressure   . History of esophagogastroduodenoscopy (EGD)    2014, 2016  . History of PFTs 10/26/2012  . Lower GI bleed 01/19/2015   Related to mobic 15 mg   . Mitral valve prolapse 09/06/1996  . Osteoarthritis of both knees   . Pneumonia    Past Surgical History:  Procedure Laterality Date  . BREAST EXCISIONAL BIOPSY Left    benign  . BREAST SURGERY  05/20/2007   [facility], Dr.William Purkert   . COLONOSCOPY     2014, 2016 Dr.Bajani Manalo, Dr.Mahmood Abedi   . KNEE ARTHROSCOPY Right    Family History  Problem Relation Age of Onset  . Heart attack Mother   . CVA Mother   . Diabetes Mother   . Congestive Heart Failure Mother   . Stroke Mother   . Heart disease Mother   . Hyperlipidemia Mother   . Hypertension Mother   . Depression Mother   . Obesity Mother   . Lung cancer Sister [id number]  . Stomach cancer Brother   .  Neurologic Disorder Son   . Colon cancer Niece   . Diabetes Maternal Grandmother    Social History   Socioeconomic History  . Marital status: Divorced    Spouse name: Not on file  . Number of children: 0  . Years of education: Not on file  . Highest education level: Not on file  Occupational History  . Occupation: Charity fundraiser  Tobacco Use  . Smoking status: Former Smoker    Packs/day: 0.25    Years: 10.00    Pack years: 2.50    Types: Cigarettes    Quit date: 09/16/1986    Years since quitting: 34.0  . Smokeless tobacco: Never Used  . Tobacco comment: Quit in her 2's   Vaping Use  . Vaping Use: Never used  Substance and  Sexual Activity  . Alcohol use: Yes    Comment: Occasionally red wine   . Drug use: No  . Sexual activity: Not Currently  Other Topics Concern  . Not on file  Social History Narrative   Diet: Regular       Caffeine: Yes, infrequently when working       Married, if yes what year: Divorced, married in 1974      Do you live in a house, apartment, assisted living, condo, trailer, ect: House, 1 stories, one person       Pets: None      Current/Past profession:       Exercise: Yes, walking, hiking, and gym    Right handed   One story home   Drinks caffeine, occasionally weekly      Living Will: No   DNR: No   POA/HPOA: No      Questions below not in new patient packet    Functional Status:   Do you have difficulty bathing or dressing yourself?   Do you have difficulty preparing food or eating?   Do you have difficulty managing your medications?   Do you have difficulty managing your finances?   Do you have difficulty affording your medications?   Social Determinants of Health   Financial Resource Strain: Not on file  Food Insecurity: Not on file  Transportation Needs: Not on file  Physical Activity: Not on file  Stress: Not on file  Social Connections: Not on file    Tobacco Counseling Counseling given: Not Answered Comment: Quit in her 77's    Clinical Intake:  Pre-visit preparation completed: Yes  Pain : 0-10 Pain Score: 5  Pain Type: Chronic pain Pain Location: Hand (left thumb due to arthritis) Pain Orientation: Left Pain Descriptors / Indicators: Aching Pain Onset: More than a month ago Pain Frequency: Constant Pain Relieving Factors: ice and movement.  Pain Relieving Factors: ice and movement.  BMI - recorded: 32 Nutritional Risks: None Diabetes: No  How often do you need to have someone help you when you read instructions, pamphlets, or other written materials from your doctor or pharmacy?: 1 - Never  Diabetic?no         Activities of  Daily Living In your present state of health, do you have any difficulty performing the following activities: 10/03/2020  Hearing? N  Vision? N  Difficulty concentrating or making decisions? N  Walking or climbing stairs? N  Dressing or bathing? N  Doing errands, shopping? N  Preparing Food and eating ? N  Using the Toilet? N  In the past six months, have you accidently leaked urine? Y  Do you have problems with loss of bowel  control? N  Managing your Medications? N  Managing your Finances? N  Housekeeping or managing your Housekeeping? N  Some recent data might be hidden    Patient Care Team: [name], NP as PCP - General (Geriatric Medicine) Drema Dallas, DO as Consulting Physician (Neurology)  Indicate any recent Medical Services you may have received from other than [facility] providers in the past year (date may be approximate).     Assessment:   This is a routine wellness examination for Meily.  Hearing/Vision screen  Hearing Screening   125Hz  250Hz  500Hz  1000Hz  2000Hz  3000Hz  4000Hz  6000Hz  8000Hz   Right ear:           Left ear:           Comments: Patient has no hearing problems  Vision Screening Comments: Patient has vision problems. Patient has eye appointment June 6.  Dietary issues and exercise activities discussed: Current Exercise Habits: Home exercise routine;Structured exercise class, Type of exercise: strength training/weights;calisthenics;Other - see comments, Time (Minutes): 45, Frequency (Times/Week): 3, Weekly Exercise (Minutes/Week): 135  Goals Addressed            This Visit's Progress   . Weight (lb) < 150 lb (68 kg)       Goal weight of 150 lbs through dietary modifications and exercise.       Depression Screen PHQ 2/9 Scores 10/03/2020 03/15/2020 06/22/2019 03/29/2019 09/16/2018 03/23/2018 03/21/2017  PHQ - 2 Score 0 0 6 0 0 0 0  PHQ- 9 Score - - 19 - - - -    Fall Risk Fall Risk  10/03/2020 03/15/2020 08/27/2019 05/13/2019 03/29/2019   Falls in the past year? 0 0 1 0 0  Number falls in past yr: 0 0 0 0 0  Injury with Fall? 0 0 0 0 0    FALL RISK PREVENTION PERTAINING TO THE HOME:  Any stairs in or around the home? Yes  If so, are there any without handrails? No  Home free of loose throw rugs in walkways, pet beds, electrical cords, etc? Yes  Adequate lighting in your home to reduce risk of falls? Yes   ASSISTIVE DEVICES UTILIZED TO PREVENT FALLS:  Life alert? No  Use of a cane, walker or w/c? No  Grab bars in the bathroom? No  Shower chair or bench in shower? Yes  Elevated toilet seat or a handicapped toilet? No   TIMED UP AND GO:  Was the test performed? No .    Cognitive Function: MMSE - Mini Mental State Exam 03/23/2018 03/21/2017  Orientation to time 5 5  Orientation to Place 5 5  Registration 3 3  Attention/ Calculation 5 5  Recall 3 3  Language- name 2 objects 2 2  Language- repeat 1 1  Language- follow 3 step command 3 3  Language- read & follow direction 1 1  Write a sentence 1 1  Copy design 0 1  Total score 29 30     6CIT Screen 10/03/2020  What Year? 0 points  What month? 0 points  What time? 0 points  Count back from 20 0 points  Months in reverse 0 points  Repeat phrase 0 points  Total Score 0    Immunizations Immunization History  Administered Date(s) Administered  . Fluad Quad(high Dose 65+) 02/22/2020  . Hepatitis A 01/21/2007, 04/12/2008  . Influenza, High Dose Seasonal PF 02/25/2018, 02/04/2019  . Influenza, Seasonal, Injecte, Preservative Fre 02/05/2017  . PFIZER(Purple Top)SARS-COV-2 Vaccination 05/05/2019, 05/26/2019, 03/17/2020  .  Pneumococcal Conjugate-13 12/25/2015  . Pneumococcal Polysaccharide-23 01/13/2012, 03/21/2017  . Td 03/06/2005, 01/23/2012  . Tdap 10/18/2014  . Zoster Recombinat (Shingrix) 05/19/2018, 11/03/2018  . Zoster, Live 05/06/2012    TDAP status: Up to date  Flu Vaccine status: Up to date  Pneumococcal vaccine status: Up to  date  Covid-19 vaccine status: Completed vaccines  Qualifies for Shingles Vaccine? Yes   Zostavax completed Yes   Shingrix Completed?: Yes  Screening Tests Health Maintenance  Topic Date Due  . INFLUENZA VACCINE  12/04/2020  . MAMMOGRAM  06/21/2022  . COLONOSCOPY (Pts 45-45yrs Insurance coverage will need to be confirmed)  05/06/2024  . TETANUS/TDAP  10/17/2024  . DEXA SCAN  Completed  . COVID-19 Vaccine  Completed  . Hepatitis C Screening  Completed  . PNA vac Low Risk Adult  Completed  . Zoster Vaccines- Shingrix  Completed  . HPV VACCINES  Aged Out    Health Maintenance  There are no preventive care reminders to display for this patient.  Colorectal cancer screening: Type of screening: Colonoscopy. Completed 2014. Repeat every 10 years  Mammogram status: Completed 06/21/20. Repeat every year  Bone Density status: Ordered 03/2020. Pt provided with contact info and advised to call to schedule appt.  Lung Cancer Screening: (Low Dose CT Chest recommended if Age 24-80 years, 30 pack-year currently smoking OR have quit w/in 15years.) does not qualify.   Lung Cancer Screening Referral:  Additional Screening:  Hepatitis C Screening: does qualify; Completed 12/25/2015  Vision Screening: Recommended annual ophthalmology exams for early detection of glaucoma and other disorders of the eye. Is the patient up to date with their annual eye exam?  Yes  Who is the provider or what is the name of the office in which the patient attends annual eye exams? [facility] If pt is not established with a provider, would they like to be referred to a provider to establish care? No .   Dental Screening: Recommended annual dental exams for proper oral hygiene  Community Resource Referral / Chronic Care Management: CRR required this visit?  No   CCM required this visit?  No      Plan:     I have personally reviewed and noted the following in the patient's chart:   . Medical and  social history . Use of alcohol, tobacco or illicit drugs  . Current medications and supplements including opioid prescriptions.  . Functional ability and status . Nutritional status . Physical activity . Advanced directives . List of other physicians . Hospitalizations, surgeries, and ER visits in previous 12 months . Vitals . Screenings to include cognitive, depression, and falls . Referrals and appointments  In addition, I have reviewed and discussed with patient certain preventive protocols, quality metrics, and best practice recommendations. A written personalized care plan for preventive services as well as general preventive health recommendations were provided to patient.     [name], NP   10/03/2020    Virtual Visit via Telephone Note  I connected with@ on 10/03/20 at 10:30 AM EDT by telephone and verified that I am speaking with the correct person using two identifiers.  Location: Patient: home Provider: [facility]   I discussed the limitations, risks, security and privacy concerns of performing an evaluation and management service by telephone and the availability of in person appointments. I also discussed with the patient that there may be a patient responsible charge related to this service. The patient expressed understanding and agreed to proceed.  I discussed the assessment and treatment plan with the patient. The patient was provided an opportunity to ask questions and all were answered. The patient agreed with the plan and demonstrated an understanding of the instructions.   The patient was advised to call back or seek an in-person evaluation if the symptoms worsen or if the condition fails to improve as anticipated.  I provided 15 minutes of non-face-to-face time during this encounter.  [employer]os [employer]. Harle Battiest Avs printed and mailed

## 2020-10-03 NOTE — Patient Instructions (Addendum)
Alejandra Crane , Thank you for taking time to come for your Medicare Wellness Visit. I appreciate your ongoing commitment to your health goals. Please review the following plan we discussed and let me know if I can assist you in the future.   Screening recommendations/referrals: Colonoscopy up to date Mammogram up to date Bone Density make sure to call and schedule appt  Recommended yearly ophthalmology/optometry visit for glaucoma screening and checkup Recommended yearly dental visit for hygiene and checkup  Vaccinations: Influenza vaccine up to date Pneumococcal vaccine up to date Tdap vaccine up to date Shingles vaccine up to date    Advanced directives: on file.   Conditions/risks identified: obesity, family hx of stroke and heart disease  Next appointment: year    Preventive Care 40 Years and Older, Female Preventive care refers to lifestyle choices and visits with your health care provider that can promote health and wellness. What does preventive care include?  A yearly physical exam. This is also called an annual well check.  Dental exams once or twice a year.  Routine eye exams. Ask your health care provider how often you should have your eyes checked.  Personal lifestyle choices, including:  Daily care of your teeth and gums.  Regular physical activity.  Eating a healthy diet.  Avoiding tobacco and drug use.  Limiting alcohol use.  Practicing safe sex.  Taking low-dose aspirin every day.  Taking vitamin and mineral supplements as recommended by your health care provider. What happens during an annual well check? The services and screenings done by your health care provider during your annual well check will depend on your age, overall health, lifestyle risk factors, and family history of disease. Counseling  Your health care provider may ask you questions about your:  Alcohol use.  Tobacco use.  Drug use.  Emotional well-being.  Home and  relationship well-being.  Sexual activity.  Eating habits.  History of falls.  Memory and ability to understand (cognition).  Work and work Astronomer.  Reproductive health. Screening  You may have the following tests or measurements:  Height, weight, and BMI.  Blood pressure.  Lipid and cholesterol levels. These may be checked every 5 years, or more frequently if you are over 78 years old.  Skin check.  Lung cancer screening. You may have this screening every year starting at age 61 if you have a 30-pack-year history of smoking and currently smoke or have quit within the past 15 years.  Fecal occult blood test (FOBT) of the stool. You may have this test every year starting at age 16.  Flexible sigmoidoscopy or colonoscopy. You may have a sigmoidoscopy every 5 years or a colonoscopy every 10 years starting at age 1.  Hepatitis C blood test.  Hepatitis B blood test.  Sexually transmitted disease (STD) testing.  Diabetes screening. This is done by checking your blood sugar (glucose) after you have not eaten for a while (fasting). You may have this done every 1-3 years.  Bone density scan. This is done to screen for osteoporosis. You may have this done starting at age 31.  Mammogram. This may be done every 1-2 years. Talk to your health care provider about how often you should have regular mammograms. Talk with your health care provider about your test results, treatment options, and if necessary, the need for more tests. Vaccines  Your health care provider may recommend certain vaccines, such as:  Influenza vaccine. This is recommended every year.  Tetanus, diphtheria, and acellular pertussis (  Tdap, Td) vaccine. You may need a Td booster every 10 years.  Zoster vaccine. You may need this after age 61.  Pneumococcal 13-valent conjugate (PCV13) vaccine. One dose is recommended after age 64.  Pneumococcal polysaccharide (PPSV23) vaccine. One dose is recommended after  age 80. Talk to your health care provider about which screenings and vaccines you need and how often you need them. This information is not intended to replace advice given to you by your health care provider. Make sure you discuss any questions you have with your health care provider. Document Released: 05/19/2015 Document Revised: 01/10/2016 Document Reviewed: 02/21/2015 Elsevier Interactive Patient Education  2017 [place] Prevention in the Home Falls can cause injuries. They can happen to people of all ages. There are many things you can do to make your home safe and to help prevent falls. What can I do on the outside of my home?  Regularly fix the edges of walkways and driveways and fix any cracks.  Remove anything that might make you trip as you walk through a door, such as a raised step or threshold.  Trim any bushes or trees on the path to your home.  Use bright outdoor lighting.  Clear any walking paths of anything that might make someone trip, such as rocks or tools.  Regularly check to see if handrails are loose or broken. Make sure that both sides of any steps have handrails.  Any raised decks and porches should have guardrails on the edges.  Have any leaves, snow, or ice cleared regularly.  Use sand or salt on walking paths during winter.  Clean up any spills in your garage right away. This includes oil or grease spills. What can I do in the bathroom?  Use night lights.  Install grab bars by the toilet and in the tub and shower. Do not use towel bars as grab bars.  Use non-skid mats or decals in the tub or shower.  If you need to sit down in the shower, use a plastic, non-slip stool.  Keep the floor dry. Clean up any water that spills on the floor as soon as it happens.  Remove soap buildup in the tub or shower regularly.  Attach bath mats securely with double-sided non-slip rug tape.  Do not have throw rugs and other things on the floor that can  make you trip. What can I do in the bedroom?  Use night lights.  Make sure that you have a light by your bed that is easy to reach.  Do not use any sheets or blankets that are too big for your bed. They should not hang down onto the floor.  Have a firm chair that has side arms. You can use this for support while you get dressed.  Do not have throw rugs and other things on the floor that can make you trip. What can I do in the kitchen?  Clean up any spills right away.  Avoid walking on wet floors.  Keep items that you use a lot in easy-to-reach places.  If you need to reach something above you, use a strong step stool that has a grab bar.  Keep electrical cords out of the way.  Do not use floor polish or wax that makes floors slippery. If you must use wax, use non-skid floor wax.  Do not have throw rugs and other things on the floor that can make you trip. What can I do with my stairs?  Do  not leave any items on the stairs.  Make sure that there are handrails on both sides of the stairs and use them. Fix handrails that are broken or loose. Make sure that handrails are as long as the stairways.  Check any carpeting to make sure that it is firmly attached to the stairs. Fix any carpet that is loose or worn.  Avoid having throw rugs at the top or bottom of the stairs. If you do have throw rugs, attach them to the floor with carpet tape.  Make sure that you have a light switch at the top of the stairs and the bottom of the stairs. If you do not have them, ask someone to add them for you. What else can I do to help prevent falls?  Wear shoes that:  Do not have high heels.  Have rubber bottoms.  Are comfortable and fit you well.  Are closed at the toe. Do not wear sandals.  If you use a stepladder:  Make sure that it is fully opened. Do not climb a closed stepladder.  Make sure that both sides of the stepladder are locked into place.  Ask someone to hold it for you,  if possible.  Clearly mark and make sure that you can see:  Any grab bars or handrails.  First and last steps.  Where the edge of each step is.  Use tools that help you move around (mobility aids) if they are needed. These include:  Canes.  Walkers.  Scooters.  Crutches.  Turn on the lights when you go into a dark area. Replace any light bulbs as soon as they burn out.  Set up your furniture so you have a clear path. Avoid moving your furniture around.  If any of your floors are uneven, fix them.  If there are any pets around you, be aware of where they are.  Review your medicines with your doctor. Some medicines can make you feel dizzy. This can increase your chance of falling. Ask your doctor what other things that you can do to help prevent falls. This information is not intended to replace advice given to you by your health care provider. Make sure you discuss any questions you have with your health care provider. Document Released: 02/16/2009 Document Revised: 09/28/2015 Document Reviewed: 05/27/2014 Elsevier Interactive Patient Education  2017 [employer].

## 2020-10-03 NOTE — Progress Notes (Signed)
Chief Complaint:   OBESITY Alejandra Crane is here to discuss her progress with her obesity treatment plan along with follow-up of her obesity related diagnoses. Alejandra Crane is on keeping a food journal and adhering to recommended goals of 1200 calories and 90 grams of protein daily and states she is following her eating plan approximately 50% of the time. Alejandra Crane states she is doing 0 minutes 0 times per week.  Today's visit was #: 19 Starting weight: 203 lbs Starting date: 06/22/2019 Today's weight: 176 lbs Today's date: 09/28/2020 Total lbs lost to date: 27 Total lbs lost since last in-office visit: 3  Interim History: Alejandra Crane has been journaling more consistently. She is getting around 70-80 grams of protein and 1300 calories daily. Her hunger is controlled.  Subjective:   1. Pre-diabetes Alejandra Crane is not on medications, and she denies polyphagia. Last A1c was 5.8.  2. Vitamin D deficiency Alejandra Crane is not on medications. Last Vit D level was 90.3.  Assessment/Plan:   1. Pre-diabetes Alejandra Crane will continue her meal plan, and will continue to work on weight loss, exercise, and decreasing simple carbohydrates to help decrease the risk of diabetes.   2. Vitamin D deficiency Alejandra Crane Vitamin D level contributes to fatigue and are associated with obesity, breast, and colon cancer. We will continue to monitor her Vit D level, and Alejandra Crane will follow-up for routine testing of Vitamin D, at least 2-3 times per year to avoid over-replacement.  3. Class 1 obesity with serious comorbidity and body mass index (BMI) of 34.0 to 34.9 in adult, unspecified obesity type Alejandra Crane is currently in the action stage of change. As such, her goal is to continue with weight loss efforts. She has agreed to keeping a food journal and adhering to recommended goals of 1200 calories and 90 grams of protein daily.   Exercise goals: No exercise has been prescribed at this time.  Behavioral modification strategies: meal planning and  cooking strategies and keeping healthy foods in the home.  Alejandra Crane has agreed to follow-up with our clinic in 4 weeks. She was informed of the importance of frequent follow-up visits to maximize her success with intensive lifestyle modifications for her multiple health conditions.   Objective:   Blood pressure 122/73, pulse (!) 58, temperature 98.9 F (37.2 C), height 5\' 2"  (1.575 m), weight 176 lb (79.8 kg), SpO2 100 %. Body mass index is 32.19 kg/m.  General: Cooperative, alert, well developed, in no acute distress. HEENT: Conjunctivae and lids unremarkable. Cardiovascular: Regular rhythm.  Lungs: Normal work of breathing. Neurologic: No focal deficits.   Lab Results  Component Value Date   CREATININE 0.85 08/02/2020   BUN 18 08/02/2020   NA 145 (H) 08/02/2020   K 3.9 08/02/2020   CL 106 08/02/2020   CO2 26 08/02/2020   Lab Results  Component Value Date   ALT 14 08/02/2020   AST 21 08/02/2020   ALKPHOS 86 08/02/2020   BILITOT 0.4 08/02/2020   Lab Results  Component Value Date   HGBA1C 5.8 (H) 08/02/2020   HGBA1C 5.9 (H) 01/19/2020   HGBA1C 5.8 (H) 06/22/2019   HGBA1C 5.6 03/15/2019   HGBA1C 5.6 03/16/2018   Lab Results  Component Value Date   INSULIN 5.0 08/02/2020   INSULIN 8.7 01/19/2020   INSULIN 7.1 06/22/2019   Lab Results  Component Value Date   TSH 2.910 06/22/2019   Lab Results  Component Value Date   CHOL 204 (H) 08/02/2020   HDL 74 08/02/2020  LDLCALC 120 (H) 08/02/2020   TRIG 55 08/02/2020   CHOLHDL 2.8 08/02/2020   Lab Results  Component Value Date   WBC 7.6 03/15/2020   HGB 11.2 (L) 03/15/2020   HCT 35.4 03/15/2020   MCV 95.4 03/15/2020   PLT 278 03/15/2020   Lab Results  Component Value Date   IRON 49 08/22/2019   TIBC 246 (L) 08/22/2019   FERRITIN 77 06/22/2019   Attestation Statements:   Reviewed by clinician on day of visit: allergies, medications, problem list, medical history, surgical history, family history, social  history, and previous encounter notes.   Trude Mcburney, am acting as transcriptionist for [employer], PA-C.  I have reviewed the above documentation for accuracy and completeness, and I agree with the above. Alejandra Cliche, PA-C

## 2020-10-09 DIAGNOSIS — H2513 Age-related nuclear cataract, bilateral: Secondary | ICD-10-CM | POA: Diagnosis not present

## 2020-10-09 DIAGNOSIS — H43812 Vitreous degeneration, left eye: Secondary | ICD-10-CM | POA: Diagnosis not present

## 2020-10-09 DIAGNOSIS — H0102B Squamous blepharitis left eye, upper and lower eyelids: Secondary | ICD-10-CM | POA: Diagnosis not present

## 2020-10-09 DIAGNOSIS — H1045 Other chronic allergic conjunctivitis: Secondary | ICD-10-CM | POA: Diagnosis not present

## 2020-10-09 DIAGNOSIS — H40013 Open angle with borderline findings, low risk, bilateral: Secondary | ICD-10-CM | POA: Diagnosis not present

## 2020-10-09 DIAGNOSIS — H35413 Lattice degeneration of retina, bilateral: Secondary | ICD-10-CM | POA: Diagnosis not present

## 2020-10-09 DIAGNOSIS — H0102A Squamous blepharitis right eye, upper and lower eyelids: Secondary | ICD-10-CM | POA: Diagnosis not present

## 2020-10-09 DIAGNOSIS — H16223 Keratoconjunctivitis sicca, not specified as Sjogren's, bilateral: Secondary | ICD-10-CM | POA: Diagnosis not present

## 2020-10-25 ENCOUNTER — Ambulatory Visit (INDEPENDENT_AMBULATORY_CARE_PROVIDER_SITE_OTHER): Payer: Medicare Other | Admitting: Physician Assistant

## 2020-11-03 ENCOUNTER — Encounter: Payer: Self-pay | Admitting: Nurse Practitioner

## 2020-11-07 ENCOUNTER — Other Ambulatory Visit: Payer: Self-pay

## 2020-11-07 ENCOUNTER — Ambulatory Visit (INDEPENDENT_AMBULATORY_CARE_PROVIDER_SITE_OTHER): Payer: 59 | Admitting: Physician Assistant

## 2020-11-07 ENCOUNTER — Encounter (INDEPENDENT_AMBULATORY_CARE_PROVIDER_SITE_OTHER): Payer: Self-pay | Admitting: Physician Assistant

## 2020-11-07 VITALS — BP 103/66 | HR 70 | Temp 98.6°F | Ht 62.0 in | Wt 178.0 lb

## 2020-11-07 DIAGNOSIS — Z6834 Body mass index (BMI) 34.0-34.9, adult: Secondary | ICD-10-CM | POA: Diagnosis not present

## 2020-11-07 DIAGNOSIS — E669 Obesity, unspecified: Secondary | ICD-10-CM | POA: Diagnosis not present

## 2020-11-07 DIAGNOSIS — Z9189 Other specified personal risk factors, not elsewhere classified: Secondary | ICD-10-CM

## 2020-11-07 DIAGNOSIS — E7849 Other hyperlipidemia: Secondary | ICD-10-CM | POA: Diagnosis not present

## 2020-11-08 ENCOUNTER — Ambulatory Visit: Payer: Self-pay

## 2020-11-08 ENCOUNTER — Ambulatory Visit: Payer: Self-pay | Attending: Internal Medicine

## 2020-11-08 ENCOUNTER — Other Ambulatory Visit (HOSPITAL_BASED_OUTPATIENT_CLINIC_OR_DEPARTMENT_OTHER): Payer: Self-pay

## 2020-11-08 DIAGNOSIS — Z23 Encounter for immunization: Secondary | ICD-10-CM

## 2020-11-08 MED ORDER — PFIZER-BIONT COVID-19 VAC-TRIS 30 MCG/0.3ML IM SUSP
INTRAMUSCULAR | 0 refills | Status: DC
  Filled 2020-11-08: qty 0.3, 1d supply, fill #0

## 2020-11-13 NOTE — Progress Notes (Signed)
Chief Complaint:   OBESITY Alejandra Crane is here to discuss her progress with her obesity treatment plan along with follow-up of her obesity related diagnoses. Alejandra Crane is on keeping a food journal and adhering to recommended goals of 1200 calories and 90 grams of protein daily and states she is following her eating plan approximately 25% of the time. Alejandra Crane states she is exercising with a personal trainer, and weights for 45 minutes 4 times per week.  Today's visit was #: 20 Starting weight: 203 lbs Starting date: 06/22/2019 Today's weight: 178 lbs Today's date: 11/07/2020 Total lbs lost to date: 25 Total lbs lost since last in-office visit: 0  Interim History: Alejandra Crane has not been journaling consistently. She had a couple of occasions when she ate ribs. She has decreased her sweets. She is going on vacation next week.  Subjective:   1. Other hyperlipidemia Alejandra Crane is on Crestor, and she is due for labs.  Assessment/Plan:   1. Other hyperlipidemia Cardiovascular risk and specific lipid/LDL goals reviewed. We discussed several lifestyle modifications today. We will recheck labs at her next visit. Alejandra Crane will continue her medications, and will continue to work on diet, exercise and weight loss efforts. Orders and follow up as documented in patient record.   Counseling Intensive lifestyle modifications are the first line treatment for this issue. Dietary changes: Increase soluble fiber. Decrease simple carbohydrates. Exercise changes: Moderate to vigorous-intensity aerobic activity 150 minutes per week if tolerated. Lipid-lowering medications: see documented in medical record.  2. Class 1 obesity with serious comorbidity and body mass index (BMI) of 34.0 to 34.9 in adult, unspecified obesity type Alejandra Crane is currently in the action stage of change. As such, her goal is to continue with weight loss efforts. She has agreed to keeping a food journal and adhering to recommended goals of 1200  calories and 90 grams of protein daily.   We will recheck fasting labs at her next visit.  Exercise goals: As is.  Behavioral modification strategies: planning for success and keeping a strict food journal.  Alejandra Crane has agreed to follow-up with our clinic in 4 weeks. She was informed of the importance of frequent follow-up visits to maximize her success with intensive lifestyle modifications for her multiple health conditions.   Objective:   Blood pressure 103/66, pulse 70, temperature 98.6 F (37 C), height 5\' 2"  (1.575 m), weight 178 lb (80.7 kg), SpO2 98 %. Body mass index is 32.56 kg/m.  General: Cooperative, alert, well developed, in no acute distress. HEENT: Conjunctivae and lids unremarkable. Cardiovascular: Regular rhythm.  Lungs: Normal work of breathing. Neurologic: No focal deficits.   Lab Results  Component Value Date   CREATININE 0.85 08/02/2020   BUN 18 08/02/2020   NA 145 (H) 08/02/2020   K 3.9 08/02/2020   CL 106 08/02/2020   CO2 26 08/02/2020   Lab Results  Component Value Date   ALT 14 08/02/2020   AST 21 08/02/2020   ALKPHOS 86 08/02/2020   BILITOT 0.4 08/02/2020   Lab Results  Component Value Date   HGBA1C 5.8 (H) 08/02/2020   HGBA1C 5.9 (H) 01/19/2020   HGBA1C 5.8 (H) 06/22/2019   HGBA1C 5.6 03/15/2019   HGBA1C 5.6 03/16/2018   Lab Results  Component Value Date   INSULIN 5.0 08/02/2020   INSULIN 8.7 01/19/2020   INSULIN 7.1 06/22/2019   Lab Results  Component Value Date   TSH 2.910 06/22/2019   Lab Results  Component Value Date   CHOL 204 (  H) 08/02/2020   HDL 74 08/02/2020   LDLCALC 120 (H) 08/02/2020   TRIG 55 08/02/2020   CHOLHDL 2.8 08/02/2020   Lab Results  Component Value Date   VD25OH 90.3 08/02/2020   VD25OH 28.1 (L) 01/19/2020   VD25OH 25.4 (L) 06/22/2019   Lab Results  Component Value Date   WBC 7.6 03/15/2020   HGB 11.2 (L) 03/15/2020   HCT 35.4 03/15/2020   MCV 95.4 03/15/2020   PLT 278 03/15/2020   Lab  Results  Component Value Date   IRON 49 08/22/2019   TIBC 246 (L) 08/22/2019   FERRITIN 77 06/22/2019    Obesity Behavioral Intervention:   Approximately 15 minutes were spent on the discussion below.  ASK: We discussed the diagnosis of obesity with Alejandra Crane today and Alejandra Crane agreed to give [place] permission to discuss obesity behavioral modification therapy today.  ASSESS: Alejandra Crane has the diagnosis of obesity and her BMI today is 32.55. Alejandra Crane is in the action stage of change.   ADVISE: Alejandra Crane was educated on the multiple health risks of obesity as well as the benefit of weight loss to improve her health. She was advised of the need for long term treatment and the importance of lifestyle modifications to improve her current health and to decrease her risk of future health problems.  AGREE: Multiple dietary modification options and treatment options were discussed and Alejandra Crane agreed to follow the recommendations documented in the above note.  ARRANGE: Alejandra Crane was educated on the importance of frequent visits to treat obesity as outlined per CMS and USPSTF guidelines and agreed to schedule her next follow up appointment today.  Attestation Statements:   Reviewed by clinician on day of visit: allergies, medications, problem list, medical history, surgical history, family history, social history, and previous encounter notes.   Trude Mcburney, am acting as transcriptionist for [employer], PA-C.  I have reviewed the above documentation for accuracy and completeness, and I agree with the above. Alois Cliche, PA-C

## 2020-11-15 DIAGNOSIS — Z20822 Contact with and (suspected) exposure to covid-19: Secondary | ICD-10-CM | POA: Diagnosis not present

## 2020-12-04 ENCOUNTER — Other Ambulatory Visit: Payer: Self-pay | Admitting: Nurse Practitioner

## 2020-12-04 ENCOUNTER — Other Ambulatory Visit (HOSPITAL_COMMUNITY): Payer: Self-pay

## 2020-12-04 DIAGNOSIS — A6009 Herpesviral infection of other urogenital tract: Secondary | ICD-10-CM

## 2020-12-04 MED ORDER — VALACYCLOVIR HCL 500 MG PO TABS
500.0000 mg | ORAL_TABLET | Freq: Every day | ORAL | 1 refills | Status: DC
  Filled 2020-12-04: qty 90, 90d supply, fill #0
  Filled 2021-04-12: qty 90, 90d supply, fill #1

## 2020-12-06 ENCOUNTER — Encounter (INDEPENDENT_AMBULATORY_CARE_PROVIDER_SITE_OTHER): Payer: Self-pay | Admitting: Bariatrics

## 2020-12-06 ENCOUNTER — Ambulatory Visit (INDEPENDENT_AMBULATORY_CARE_PROVIDER_SITE_OTHER): Payer: 59 | Admitting: Bariatrics

## 2020-12-06 ENCOUNTER — Other Ambulatory Visit: Payer: Self-pay

## 2020-12-06 VITALS — BP 125/76 | HR 67 | Temp 98.0°F | Ht 62.0 in | Wt 175.0 lb

## 2020-12-06 DIAGNOSIS — R7303 Prediabetes: Secondary | ICD-10-CM | POA: Diagnosis not present

## 2020-12-06 DIAGNOSIS — E66812 Obesity, class 2: Secondary | ICD-10-CM

## 2020-12-06 DIAGNOSIS — E7849 Other hyperlipidemia: Secondary | ICD-10-CM

## 2020-12-06 DIAGNOSIS — Z6835 Body mass index (BMI) 35.0-35.9, adult: Secondary | ICD-10-CM

## 2020-12-07 ENCOUNTER — Encounter (INDEPENDENT_AMBULATORY_CARE_PROVIDER_SITE_OTHER): Payer: Self-pay | Admitting: Bariatrics

## 2020-12-07 NOTE — Progress Notes (Signed)
Chief Complaint:   OBESITY Alejandra Crane is here to discuss her progress with her obesity treatment plan along with follow-up of her obesity related diagnoses. Alejandra Crane is on keeping a food journal and adhering to recommended goals of 1200 calories and 90 grams of protein and states she is following her eating plan approximately 25% of the time. Alejandra Crane states she is walking for 60 minutes 2-3 times per week.  Today's visit was #: 21 Starting weight: 203 lbs Starting date: 06/22/2019 Today's weight: 175 lbs Today's date: 12/06/2020 Total lbs lost to date: 28 lbs Total lbs lost since last in-office visit: 3 lbs  Interim History: Alejandra Crane is down another 3 lbs since her last visit. She has been on vacation and still lost weight. Her goal is to get down to 170 lbs by the next visit.  Subjective:   1. Other hyperlipidemia Alejandra Crane is taking Crestor.  2. Pre-diabetes Alejandra Crane is not on medications for Pre-diabetes.  Assessment/Plan:   1. Other hyperlipidemia Alejandra Crane will continue her medications. Alejandra Crane will change  ratio of Omega 3 to Omega 6. Cardiovascular risk and specific lipid/LDL goals reviewed.  We discussed several lifestyle modifications today and Alejandra Crane will continue to work on diet, exercise and weight loss efforts. Orders and follow up as documented in patient record.   Counseling Intensive lifestyle modifications are the first line treatment for this issue. Dietary changes: Increase soluble fiber. Decrease simple carbohydrates. Exercise changes: Moderate to vigorous-intensity aerobic activity 150 minutes per week if tolerated. Lipid-lowering medications: see documented in medical record.   2. Pre-diabetes Alejandra Crane will decrease carbohydrates and increase healthy proteins and fats. She will increase exercise. She will continue to work on weight loss, exercise, and decreasing simple carbohydrates to help decrease the risk of diabetes.    3. Obesity, curretn BMI 32.2 Alejandra Crane is  currently in the action stage of change. As such, her goal is to continue with weight loss efforts. She has agreed to keeping a food journal and adhering to recommended goals of 1200 calories and 90 grams of protein.   Alejandra Crane will continue meal planning. She will continue intentional eating. She will continue journaling.  Exercise goals:  As is, Alejandra Crane will continue walking and exercising.  Behavioral modification strategies: increasing lean protein intake, decreasing simple carbohydrates, increasing vegetables, increasing water intake, decreasing eating out, no skipping meals, meal planning and cooking strategies, keeping healthy foods in the home, and planning for success.  Kamiryn has agreed to follow-up with our clinic in 3 weeks with Alejandra Cliche, PA-C). She was informed of the importance of frequent follow-up visits to maximize her success with intensive lifestyle modifications for her multiple health conditions.   Objective:   Blood pressure 125/76, pulse 67, temperature 98 F (36.7 C), height 5\' 2"  (1.575 m), weight 175 lb (79.4 kg), SpO2 98 %. Body mass index is 32.01 kg/m.  General: Cooperative, alert, well developed, in no acute distress. HEENT: Conjunctivae and lids unremarkable. Cardiovascular: Regular rhythm.  Lungs: Normal work of breathing. Neurologic: No focal deficits.   Lab Results  Component Value Date   CREATININE 0.85 08/02/2020   BUN 18 08/02/2020   NA 145 (H) 08/02/2020   K 3.9 08/02/2020   CL 106 08/02/2020   CO2 26 08/02/2020   Lab Results  Component Value Date   ALT 14 08/02/2020   AST 21 08/02/2020   ALKPHOS 86 08/02/2020   BILITOT 0.4 08/02/2020   Lab Results  Component Value Date   HGBA1C 5.8 (H)  08/02/2020   HGBA1C 5.9 (H) 01/19/2020   HGBA1C 5.8 (H) 06/22/2019   HGBA1C 5.6 03/15/2019   HGBA1C 5.6 03/16/2018   Lab Results  Component Value Date   INSULIN 5.0 08/02/2020   INSULIN 8.7 01/19/2020   INSULIN 7.1 06/22/2019   Lab Results   Component Value Date   TSH 2.910 06/22/2019   Lab Results  Component Value Date   CHOL 204 (H) 08/02/2020   HDL 74 08/02/2020   LDLCALC 120 (H) 08/02/2020   TRIG 55 08/02/2020   CHOLHDL 2.8 08/02/2020   Lab Results  Component Value Date   VD25OH 90.3 08/02/2020   VD25OH 28.1 (L) 01/19/2020   VD25OH 25.4 (L) 06/22/2019   Lab Results  Component Value Date   WBC 7.6 03/15/2020   HGB 11.2 (L) 03/15/2020   HCT 35.4 03/15/2020   MCV 95.4 03/15/2020   PLT 278 03/15/2020   Lab Results  Component Value Date   IRON 49 08/22/2019   TIBC 246 (L) 08/22/2019   FERRITIN 77 06/22/2019   Attestation Statements:   Reviewed by clinician on day of visit: allergies, medications, problem list, medical history, surgical history, family history, social history, and previous encounter notes.  Time spent on visit including pre-visit chart review and post-visit care and charting was 20 minutes.   I, Jackson Latino, RMA, am acting as Energy manager for [employer], DO.   I have reviewed the above documentation for accuracy and completeness, and I agree with the above. Corinna Capra, DO

## 2020-12-18 ENCOUNTER — Encounter (INDEPENDENT_AMBULATORY_CARE_PROVIDER_SITE_OTHER): Payer: Self-pay

## 2020-12-27 ENCOUNTER — Ambulatory Visit (INDEPENDENT_AMBULATORY_CARE_PROVIDER_SITE_OTHER): Payer: Medicare Other | Admitting: Physician Assistant

## 2021-01-01 ENCOUNTER — Ambulatory Visit (INDEPENDENT_AMBULATORY_CARE_PROVIDER_SITE_OTHER): Payer: Medicare Other | Admitting: Physician Assistant

## 2021-02-15 ENCOUNTER — Encounter: Payer: Self-pay | Admitting: Nurse Practitioner

## 2021-02-27 ENCOUNTER — Ambulatory Visit (INDEPENDENT_AMBULATORY_CARE_PROVIDER_SITE_OTHER): Payer: 59 | Admitting: *Deleted

## 2021-02-27 ENCOUNTER — Other Ambulatory Visit: Payer: Self-pay

## 2021-02-27 DIAGNOSIS — Z23 Encounter for immunization: Secondary | ICD-10-CM | POA: Diagnosis not present

## 2021-03-12 ENCOUNTER — Other Ambulatory Visit: Payer: Self-pay

## 2021-03-12 ENCOUNTER — Ambulatory Visit (INDEPENDENT_AMBULATORY_CARE_PROVIDER_SITE_OTHER): Payer: 59 | Admitting: Nurse Practitioner

## 2021-03-12 ENCOUNTER — Encounter: Payer: Self-pay | Admitting: Nurse Practitioner

## 2021-03-12 VITALS — BP 117/78 | HR 63 | Temp 97.3°F | Ht 62.0 in | Wt 178.0 lb

## 2021-03-12 DIAGNOSIS — M25562 Pain in left knee: Secondary | ICD-10-CM

## 2021-03-12 DIAGNOSIS — D649 Anemia, unspecified: Secondary | ICD-10-CM | POA: Diagnosis not present

## 2021-03-12 DIAGNOSIS — E782 Mixed hyperlipidemia: Secondary | ICD-10-CM | POA: Diagnosis not present

## 2021-03-12 DIAGNOSIS — M1712 Unilateral primary osteoarthritis, left knee: Secondary | ICD-10-CM | POA: Diagnosis not present

## 2021-03-12 DIAGNOSIS — E559 Vitamin D deficiency, unspecified: Secondary | ICD-10-CM

## 2021-03-12 DIAGNOSIS — I10 Essential (primary) hypertension: Secondary | ICD-10-CM

## 2021-03-12 DIAGNOSIS — R739 Hyperglycemia, unspecified: Secondary | ICD-10-CM

## 2021-03-12 DIAGNOSIS — M19042 Primary osteoarthritis, left hand: Secondary | ICD-10-CM

## 2021-03-12 NOTE — Progress Notes (Signed)
Careteam: Patient Care Team: Lauree Chandler, NP as PCP - General (Geriatric Medicine) Pieter Partridge, DO as Consulting Physician (Neurology)  PLACE OF SERVICE:  [place] Directive information Does Patient Have a Medical Advance Directive?: Yes, Type of Advance Directive: [place];Living will, Does patient want to make changes to medical advance directive?: No - Patient declined  Allergies  Allergen Reactions   Mobic [Meloxicam]     GI bleed    Chief Complaint  Patient presents with   Medical Management of Chronic Issues    6 month follow-up and discuss need for covid # 5 or postpone/exclude if patient refuses. Discuss letter about Lorenza Chick, previous written in 2019.      HPI: Patient is a 70 y.o. 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 Left hand is dominant hand.  Uses ice to which helps with relief.Reports that pain has decreased in thumb and now occurs more in the middle of left hand. Her arthritis limits what she can do at work- the [employer] is very hands on and increases her pain.   Left knee pain- started in August, lasted about 3 months. Reports that knee was painful, swollen, used OTC Voltaren gel and ice which helped with relief.   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 Patient request a note to excuse from class which involves physical activity. Her request is due to increased arthritis in left hand, knee and wanting to avoid further injury.    Weight today is up 3.2 lbs since last visit in August. She reports that her last recorded weight at home  November 1st was 172 lbs. She reports a decrease in appetite and desire for food.  Last visit with weight loss clinic was in August. She did not follow up after last visit and is would like to see how she does using tools learned in he clinic. Labs from weight loss clinic completed in March 2022.   HLD-  on crestor 5 mg daily    Exercising- was walking and exercising but has not been active this past month. She does remain active around the house and at work.   Vitamin D-taking gummy supplement daily  Mood-stable continues daily zoloft   Review of Systems:  Review of Systems  Constitutional:  Negative for chills, fever, malaise/fatigue and weight loss.  HENT:  Negative for tinnitus.   Eyes:  Negative for pain.  Respiratory:  Negative for cough, shortness of breath and wheezing.   Cardiovascular:  Negative for chest pain, palpitations and leg swelling.  Gastrointestinal:  Negative for abdominal pain, constipation, diarrhea and heartburn.  Genitourinary:  Negative for dysuria.  Musculoskeletal:  Positive for joint pain.  Skin:  Negative for rash.   Past Medical History:  Diagnosis Date   Anemia    Anxiety    Arthritis    Asthma    Depression    Diverticulosis    noted on colonscopy    Elevated cholesterol    GERD (gastroesophageal reflux disease)    H/O mammogram    2015, 2016   Hepatitis    Herpes    Not gential   High blood pressure    History of esophagogastroduodenoscopy (EGD)    2014, 2016   History of PFTs 10/26/2012   Lower GI bleed 01/19/2015   Related to mobic 15 mg    Mitral valve prolapse 09/06/1996   Osteoarthritis of both knees  Pneumonia    Past Surgical History:  Procedure Laterality Date   BREAST EXCISIONAL BIOPSY Left    benign   BREAST SURGERY  05/20/2007   [facility], Dr.William Purkert    COLONOSCOPY     2014, 2016 Dr.Bajani Manalo, Dr.Mahmood Abedi    KNEE ARTHROSCOPY Right    Social History:   reports that she quit smoking about 34 years ago. Her smoking use included cigarettes. She has a 2.50 pack-year smoking history. She has never used smokeless tobacco. She reports current alcohol use. She reports that she does not use drugs.  Family History  Problem Relation Age of Onset   Heart attack Mother    CVA Mother    Diabetes Mother    Congestive Heart Failure  Mother    Stroke Mother    Heart disease Mother    Hyperlipidemia Mother    Hypertension Mother    Depression Mother    Obesity Mother    Lung cancer Sister 14   Stomach cancer Brother    Neurologic Disorder Son    Colon cancer Niece    Diabetes Maternal Grandmother     Medications: Patient's Medications  New Prescriptions   No medications on file  Previous Medications   COVID-19 MRNA VAC-TRIS, PFIZER, (PFIZER-BIONT COVID-19 VAC-TRIS) SUSP INJECTION    Inject into the muscle.   HYDROCHLOROTHIAZIDE (HYDRODIURIL) 25 MG TABLET    TAKE 1 TABLET BY MOUTH DAILY.   ROSUVASTATIN (CRESTOR) 5 MG TABLET    Take 1 tablet (5 mg total) by mouth at bedtime.   SERTRALINE (ZOLOFT) 100 MG TABLET    TAKE 1 TABLET BY MOUTH DAILY.   VALACYCLOVIR (VALTREX) 500 MG TABLET    Take 1 tablet (500 mg total) by mouth daily.  Modified Medications   No medications on file  Discontinued Medications   No medications on file    Physical Exam:  Vitals:   03/12/21 1007  BP: 117/78  Pulse: 63  Temp: (!) 97.3 F (36.3 C)  TempSrc: Temporal  SpO2: 98%  Weight: 178 lb (80.7 kg)  Height: _0  (1.575 m)   Body mass index is 32.56 kg/m. Wt Readings from Last 3 Encounters:  03/12/21 178 lb (80.7 kg)  12/06/20 175 lb (79.4 kg)  11/07/20 178 lb (80.7 kg)    Physical Exam Constitutional:      Appearance: Normal appearance.  HENT:     Head: Normocephalic and atraumatic.  Cardiovascular:     Rate and Rhythm: Normal rate and regular rhythm.  Pulmonary:     Effort: Pulmonary effort is normal.     Breath sounds: Normal breath sounds.  Abdominal:     General: Bowel sounds are normal.  Musculoskeletal:        General: Swelling present.     Comments: Left knee  Skin:    General: Skin is warm.  Neurological:     Mental Status: She is alert and oriented to person, place, and time.  Psychiatric:        Mood and Affect: Mood normal.    Labs reviewed: Basic Metabolic Panel: Recent Labs     08/02/20 1058  NA 145*  K 3.9  CL 106  CO2 26  GLUCOSE 80  BUN 18  CREATININE 0.85  CALCIUM 9.6   Liver Function Tests: Recent Labs    08/02/20 1058  AST 21  ALT 14  ALKPHOS 86  BILITOT 0.4  PROT 7.2  ALBUMIN 4.1   No results for input(s): LIPASE, AMYLASE in  the last 8760 hours. No results for input(s): AMMONIA in the last 8760 hours. CBC: Recent Labs    03/15/20 1338  WBC 7.6  NEUTROABS 4,203  HGB 11.2*  HCT 35.4  MCV 95.4  PLT 278   Lipid Panel: Recent Labs    08/02/20 1058  CHOL 204*  HDL 74  LDLCALC 120*  TRIG 55  CHOLHDL 2.8   TSH: No results for input(s): TSH in the last 8760 hours. A1C: Lab Results  Component Value Date   HGBA1C 5.8 (H) 08/02/2020     Assessment/Plan 1. Anemia, unspecified type -will follow up at this time. Not currently on supplement.   2. Essential hypertension BP today well controlled 117/79. Continue HCTZ 25 mg daily with dietary modifications.  - CBC with Differential/Platelet - CMP with eGFR([facility])  3. Mixed hyperlipidemia Continues crestor and diet modifications.  - Lipid panel  4. Vitamin D deficiency Continue Vitamin D gummy supplement  - Vitamin D, 25-hydroxy  5. Hyperglycemia Continue dietary modification.  - Hemoglobin A1c  6. Primary osteoarthritis of left hand Ongoing. Continue ice and Voltaren gel PRN which helps.  Note given to exclude from [facility] training  7. Osteoarthritis of left knee -can use ice PRN, Continue Voltaren gel PRN and tylenol PRN  Next appt: 6 months, sooner if needed.   I personally was present during the history, physical exam and medical decision-making activities of this service and have verified that the service and findings are accurately documented in the student's note Joellyn Grandt K. [place], [facility] [phone]

## 2021-03-13 ENCOUNTER — Telehealth: Payer: Self-pay

## 2021-03-13 ENCOUNTER — Other Ambulatory Visit: Payer: Self-pay

## 2021-03-13 LAB — LIPID PANEL
Cholesterol: 217 mg/dL — ABNORMAL HIGH (ref <200)
HDL: 72 mg/dL (ref >=50)
LDL Cholesterol (Calc): 130 mg/dL (calc) — ABNORMAL HIGH
Non-HDL Cholesterol (Calc): 145 mg/dL (calc) — ABNORMAL HIGH (ref <130)
Total CHOL/HDL Ratio: 3 (calc) (ref <5.0)
Triglycerides: 63 mg/dL (ref <150)

## 2021-03-13 LAB — COMPLETE METABOLIC PANEL WITH GFR
AG Ratio: 1.4 (calc) (ref 1.0–2.5)
ALT: 11 U/L (ref 6–29)
AST: 17 U/L (ref 10–35)
Albumin: 3.8 g/dL (ref 3.6–5.1)
Alkaline phosphatase (APISO): 66 U/L (ref 37–153)
BUN: 19 mg/dL (ref 7–25)
CO2: 32 mmol/L (ref 20–32)
Calcium: 9.4 mg/dL (ref 8.6–10.4)
Chloride: 107 mmol/L (ref 98–110)
Creat: 0.87 mg/dL (ref 0.50–1.05)
Globulin: 2.8 g/dL (calc) (ref 1.9–3.7)
Glucose, Bld: 85 mg/dL (ref 65–99)
Potassium: 4.1 mmol/L (ref 3.5–5.3)
Sodium: 142 mmol/L (ref 135–146)
Total Bilirubin: 0.3 mg/dL (ref 0.2–1.2)
Total Protein: 6.6 g/dL (ref 6.1–8.1)
eGFR: 72 mL/min/{1.73_m2} (ref >=60)

## 2021-03-13 LAB — CBC WITH DIFFERENTIAL/PLATELET
Absolute Monocytes: 454 cells/uL (ref 200–950)
Basophils Absolute: 38 cells/uL (ref 0–200)
Basophils Relative: 0.6 %
Eosinophils Absolute: 102 cells/uL (ref 15–500)
Eosinophils Relative: 1.6 %
HCT: 37.5 % (ref 35.0–45.0)
Hemoglobin: 11.8 g/dL (ref 11.7–15.5)
Lymphs Abs: 2502 cells/uL (ref 850–3900)
MCH: 30.3 pg (ref 27.0–33.0)
MCHC: 31.5 g/dL — ABNORMAL LOW (ref 32.0–36.0)
MCV: 96.4 fL (ref 80.0–100.0)
MPV: 10.2 fL (ref 7.5–12.5)
Monocytes Relative: 7.1 %
Neutro Abs: 3302 cells/uL (ref 1500–7800)
Neutrophils Relative %: 51.6 %
Platelets: 268 10*3/uL (ref 140–400)
RBC: 3.89 10*6/uL (ref 3.80–5.10)
RDW: 13.6 % (ref 11.0–15.0)
Total Lymphocyte: 39.1 %
WBC: 6.4 10*3/uL (ref 3.8–10.8)

## 2021-03-13 LAB — HEMOGLOBIN A1C
Hgb A1c MFr Bld: 5.6 % of total Hgb (ref <5.7)
Mean Plasma Glucose: 114 mg/dL
eAG (mmol/L): 6.3 mmol/L

## 2021-03-13 LAB — VITAMIN D 25 HYDROXY (VIT D DEFICIENCY, FRACTURES): Vit D, 25-Hydroxy: 37 ng/mL (ref 30–100)

## 2021-04-12 ENCOUNTER — Other Ambulatory Visit: Payer: Self-pay | Admitting: Nurse Practitioner

## 2021-04-12 ENCOUNTER — Other Ambulatory Visit (HOSPITAL_COMMUNITY): Payer: Self-pay

## 2021-04-12 DIAGNOSIS — F325 Major depressive disorder, single episode, in full remission: Secondary | ICD-10-CM

## 2021-04-12 DIAGNOSIS — I10 Essential (primary) hypertension: Secondary | ICD-10-CM

## 2021-04-12 MED ORDER — SERTRALINE HCL 100 MG PO TABS
100.0000 mg | ORAL_TABLET | Freq: Every day | ORAL | 1 refills | Status: DC
  Filled 2021-04-12: qty 90, 90d supply, fill #0
  Filled 2021-11-26: qty 90, 90d supply, fill #1

## 2021-04-12 MED ORDER — HYDROCHLOROTHIAZIDE 25 MG PO TABS
25.0000 mg | ORAL_TABLET | Freq: Every day | ORAL | 1 refills | Status: DC
  Filled 2021-04-12: qty 90, 90d supply, fill #0
  Filled 2021-11-26: qty 90, 90d supply, fill #1

## 2021-06-28 ENCOUNTER — Other Ambulatory Visit (HOSPITAL_COMMUNITY): Payer: Self-pay

## 2021-06-28 ENCOUNTER — Other Ambulatory Visit: Payer: Self-pay | Admitting: Nurse Practitioner

## 2021-06-28 DIAGNOSIS — E782 Mixed hyperlipidemia: Secondary | ICD-10-CM

## 2021-07-03 ENCOUNTER — Other Ambulatory Visit: Payer: Self-pay | Admitting: *Deleted

## 2021-07-03 ENCOUNTER — Other Ambulatory Visit (HOSPITAL_COMMUNITY): Payer: Self-pay

## 2021-07-03 MED ORDER — ROSUVASTATIN CALCIUM 10 MG PO TABS
10.0000 mg | ORAL_TABLET | Freq: Every day | ORAL | 3 refills | Status: DC
  Filled 2021-07-03: qty 90, 90d supply, fill #0
  Filled 2021-11-26: qty 90, 90d supply, fill #1
  Filled 2022-03-07: qty 90, 90d supply, fill #2
  Filled 2022-06-02: qty 90, 90d supply, fill #3

## 2021-07-03 NOTE — Telephone Encounter (Signed)
Patient requested refill

## 2021-09-17 ENCOUNTER — Encounter: Payer: Self-pay | Admitting: Nurse Practitioner

## 2021-09-17 ENCOUNTER — Other Ambulatory Visit: Payer: Self-pay | Admitting: Nurse Practitioner

## 2021-09-17 ENCOUNTER — Ambulatory Visit (INDEPENDENT_AMBULATORY_CARE_PROVIDER_SITE_OTHER): Payer: 59 | Admitting: Nurse Practitioner

## 2021-09-17 VITALS — BP 118/80 | HR 56 | Temp 97.3°F | Resp 18 | Ht 63.0 in | Wt 180.6 lb

## 2021-09-17 DIAGNOSIS — M19042 Primary osteoarthritis, left hand: Secondary | ICD-10-CM | POA: Diagnosis not present

## 2021-09-17 DIAGNOSIS — E782 Mixed hyperlipidemia: Secondary | ICD-10-CM | POA: Diagnosis not present

## 2021-09-17 DIAGNOSIS — F325 Major depressive disorder, single episode, in full remission: Secondary | ICD-10-CM | POA: Diagnosis not present

## 2021-09-17 DIAGNOSIS — D649 Anemia, unspecified: Secondary | ICD-10-CM

## 2021-09-17 DIAGNOSIS — E6609 Other obesity due to excess calories: Secondary | ICD-10-CM

## 2021-09-17 DIAGNOSIS — I10 Essential (primary) hypertension: Secondary | ICD-10-CM | POA: Diagnosis not present

## 2021-09-17 DIAGNOSIS — Z1231 Encounter for screening mammogram for malignant neoplasm of breast: Secondary | ICD-10-CM

## 2021-09-17 DIAGNOSIS — Z6831 Body mass index (BMI) 31.0-31.9, adult: Secondary | ICD-10-CM

## 2021-09-17 DIAGNOSIS — E2839 Other primary ovarian failure: Secondary | ICD-10-CM

## 2021-09-17 LAB — LIPID PANEL
Cholesterol: 192 mg/dL (ref <200)
HDL: 72 mg/dL (ref >=50)
LDL Cholesterol (Calc): 106 mg/dL (calc) — ABNORMAL HIGH
Non-HDL Cholesterol (Calc): 120 mg/dL (calc) (ref <130)
Total CHOL/HDL Ratio: 2.7 (calc) (ref <5.0)
Triglycerides: 54 mg/dL (ref <150)

## 2021-09-17 LAB — COMPLETE METABOLIC PANEL WITH GFR
AG Ratio: 1.5 (calc) (ref 1.0–2.5)
ALT: 11 U/L (ref 6–29)
AST: 16 U/L (ref 10–35)
Albumin: 3.8 g/dL (ref 3.6–5.1)
Alkaline phosphatase (APISO): 62 U/L (ref 37–153)
BUN: 18 mg/dL (ref 7–25)
CO2: 31 mmol/L (ref 20–32)
Calcium: 9.2 mg/dL (ref 8.6–10.4)
Chloride: 108 mmol/L (ref 98–110)
Creat: 0.82 mg/dL (ref 0.50–1.05)
Globulin: 2.6 g/dL (calc) (ref 1.9–3.7)
Glucose, Bld: 83 mg/dL (ref 65–99)
Potassium: 4 mmol/L (ref 3.5–5.3)
Sodium: 143 mmol/L (ref 135–146)
Total Bilirubin: 0.3 mg/dL (ref 0.2–1.2)
Total Protein: 6.4 g/dL (ref 6.1–8.1)
eGFR: 77 mL/min/{1.73_m2} (ref >=60)

## 2021-09-17 LAB — CBC WITH DIFFERENTIAL/PLATELET
Absolute Monocytes: 431 cells/uL (ref 200–950)
Basophils Absolute: 41 cells/uL (ref 0–200)
Basophils Relative: 0.7 %
Eosinophils Absolute: 83 cells/uL (ref 15–500)
Eosinophils Relative: 1.4 %
HCT: 37.6 % (ref 35.0–45.0)
Hemoglobin: 12.2 g/dL (ref 11.7–15.5)
Lymphs Abs: 2407 cells/uL (ref 850–3900)
MCH: 31.4 pg (ref 27.0–33.0)
MCHC: 32.4 g/dL (ref 32.0–36.0)
MCV: 96.7 fL (ref 80.0–100.0)
MPV: 10.5 fL (ref 7.5–12.5)
Monocytes Relative: 7.3 %
Neutro Abs: 2938 cells/uL (ref 1500–7800)
Neutrophils Relative %: 49.8 %
Platelets: 264 10*3/uL (ref 140–400)
RBC: 3.89 10*6/uL (ref 3.80–5.10)
RDW: 13 % (ref 11.0–15.0)
Total Lymphocyte: 40.8 %
WBC: 5.9 10*3/uL (ref 3.8–10.8)

## 2021-09-17 NOTE — Progress Notes (Signed)
? ? ?Careteam: ?Patient Care Team: ?Lauree Chandler, NP as PCP - General (Geriatric Medicine) ?Pieter Partridge, DO as Consulting Physician (Neurology) ? ?PLACE OF SERVICE:  ?[facility]  ?Advanced Directive information ?Does Patient Have a Medical Advance Directive?: Yes, Type of Advance Directive: [place];Living will, Does patient want to make changes to medical advance directive?: No - Patient declined ? ?Allergies  ?Allergen Reactions  ? Mobic [Meloxicam]   ?  GI bleed  ? ? ?Chief Complaint  ?Patient presents with  ? Medical Management of Chronic Issues  ?  6 month follow-up. Discuss need for additional covid boosters or post pone if patient refuses. [facility] verified.    ? ? ? ?HPI: Patient is a 71 y.o. female for routine follow up.  ?She gained 10 lbs but working to lose it.  ? ?She is having pain and swelling in hand. Wants something done bc it is bothering her a lot.  ?Hurts in the left hand which is her dominate hand.  ?Hand sews a lot ?Uses ice helps but only temporary.  ?Has used voltaren gel, did not help  ?Does not want to take extra medication. Hates the 4 pills that she takes.  ?She got a steroid injection in knee which helped.  ? ?Hyperlipidemia- taking crestor 10- had to increase after last visit. No side effects noted from this. She is also working on diet.  ? ?Depression- in remission on zoloft 100 mg, yesterday was sad being mothers days. Has some mood changes but generally does well.  ? ?Continues on valtrex daily for flares, if she stops the she will have a flare.  ?Plans to have mammogram during her birthday month.  ? ? ?Review of Systems:  ?Review of Systems  ?Constitutional:  Negative for chills, fever and weight loss.  ?HENT:  Negative for tinnitus.   ?Respiratory:  Negative for cough, sputum production and shortness of breath.   ?Cardiovascular:  Negative for chest pain, palpitations and leg swelling.  ?Gastrointestinal:  Negative for abdominal pain, constipation, diarrhea  and heartburn.  ?Genitourinary:  Negative for dysuria, frequency and urgency.  ?Musculoskeletal:  Positive for joint pain. Negative for back pain, falls and myalgias.  ?Skin: Negative.   ?Neurological:  Negative for dizziness and headaches.  ?Psychiatric/Behavioral:  Negative for depression and memory loss. The patient does not have insomnia.   ? ?Past Medical History:  ?Diagnosis Date  ? Anemia   ? Anxiety   ? Arthritis   ? Asthma   ? Depression   ? Diverticulosis   ? noted on colonscopy   ? Elevated cholesterol   ? GERD (gastroesophageal reflux disease)   ? H/O mammogram   ? 2015, 2016  ? Hepatitis   ? Herpes   ? Not gential  ? High blood pressure   ? History of esophagogastroduodenoscopy (EGD)   ? 2014, 2016  ? History of PFTs 10/26/2012  ? Lower GI bleed 01/19/2015  ? Related to mobic 15 mg   ? Mitral valve prolapse 09/06/1996  ? Osteoarthritis of both knees   ? Pneumonia   ? ?Past Surgical History:  ?Procedure Laterality Date  ? BREAST EXCISIONAL BIOPSY Left   ? benign  ? BREAST SURGERY  05/20/2007  ? Biospy, Dr.William Purkert   ? COLONOSCOPY    ? 2014, 2016 Dr.Bajani Manalo, [place]   ? KNEE ARTHROSCOPY Right   ? ?Social History: ?  reports that she quit smoking about 35 years ago. Her smoking use  included cigarettes. She has a 2.50 pack-year smoking history. She has never used smokeless tobacco. She reports current alcohol use. She reports that she does not use drugs. ? ?Family History  ?Problem Relation Age of Onset  ? Heart attack Mother   ? CVA Mother   ? Diabetes Mother   ? Congestive Heart Failure Mother   ? Stroke Mother   ? Heart disease Mother   ? Hyperlipidemia Mother   ? Hypertension Mother   ? Depression Mother   ? Obesity Mother   ? Lung cancer Sister 40  ? Stomach cancer Brother   ? Neurologic Disorder Son   ? Colon cancer Niece   ? Diabetes Maternal Grandmother   ? ? ?Medications: ?Patient's Medications  ?New Prescriptions  ? No medications on file  ?Previous Medications  ?  HYDROCHLOROTHIAZIDE (HYDRODIURIL) 25 MG TABLET    TAKE 1 TABLET BY MOUTH DAILY.  ? ROSUVASTATIN (CRESTOR) 10 MG TABLET    Take 1 tablet (10 mg total) by mouth daily.  ? SERTRALINE (ZOLOFT) 100 MG TABLET    TAKE 1 TABLET BY MOUTH DAILY.  ? VALACYCLOVIR (VALTREX) 500 MG TABLET    Take 1 tablet (500 mg total) by mouth daily.  ?Modified Medications  ? No medications on file  ?Discontinued Medications  ? CHOLECALCIFEROL (VITAMIN D3) 50 MCG (2000 UT) CAPSULE    Take 1 capsule (2,000 Units total) by mouth daily.  ? ? ?Physical Exam: ? ?Vitals:  ? 09/17/21 0840  ?BP: 118/80  ?Pulse: (!) 56  ?Resp: 18  ?Temp: (!) 97.3 ?F (36.3 ?C)  ?SpO2: 96%  ?Weight: 180 lb 9.6 oz (81.9 kg)  ?Height: _0  (1.6 m)  ? ?Body mass index is 31.99 kg/m?. ?Wt Readings from Last 3 Encounters:  ?09/17/21 180 lb 9.6 oz (81.9 kg)  ?03/12/21 178 lb (80.7 kg)  ?12/06/20 175 lb (79.4 kg)  ? ? ?Physical Exam ?Constitutional:   ?   General: She is not in acute distress. ?   Appearance: She is well-developed. She is not diaphoretic.  ?HENT:  ?   Head: Normocephalic and atraumatic.  ?   Mouth/Throat:  ?   Pharynx: No oropharyngeal exudate.  ?Eyes:  ?   Conjunctiva/sclera: Conjunctivae normal.  ?   Pupils: Pupils are equal, round, and reactive to light.  ?Cardiovascular:  ?   Rate and Rhythm: Normal rate and regular rhythm.  ?   Heart sounds: Normal heart sounds.  ?Pulmonary:  ?   Effort: Pulmonary effort is normal.  ?   Breath sounds: Normal breath sounds.  ?Abdominal:  ?   General: Bowel sounds are normal.  ?   Palpations: Abdomen is soft.  ?Musculoskeletal:  ?   Cervical back: Normal range of motion and neck supple.  ?   Right lower leg: No edema.  ?   Left lower leg: No edema.  ?   Comments: Increase tenderness to IP joint of left thumb  ?Skin: ?   General: Skin is warm and dry.  ?Neurological:  ?   Mental Status: She is alert.  ?Psychiatric:     ?   Mood and Affect: Mood normal.  ? ? ?Labs reviewed: ?Basic Metabolic Panel: ?Recent Labs  ?   03/12/21 ?1049  ?NA 142  ?K 4.1  ?CL 107  ?CO2 32  ?GLUCOSE 85  ?BUN 19  ?CREATININE 0.87  ?CALCIUM 9.4  ? ?Liver Function Tests: ?Recent Labs  ?  03/12/21 ?1049  ?AST 17  ?ALT 11  ?  BILITOT 0.3  ?PROT 6.6  ? ?No results for input(s): LIPASE, AMYLASE in the last 8760 hours. ?No results for input(s): AMMONIA in the last 8760 hours. ?CBC: ?Recent Labs  ?  03/12/21 ?1049  ?WBC 6.4  ?NEUTROABS 3,302  ?HGB 11.8  ?HCT 37.5  ?MCV 96.4  ?PLT 268  ? ?Lipid Panel: ?Recent Labs  ?  03/12/21 ?1049  ?CHOL 217*  ?HDL 72  ?LDLCALC 130*  ?TRIG 63  ?CHOLHDL 3.0  ? ?TSH: ?No results for input(s): TSH in the last 8760 hours. ?A1C: ?Lab Results  ?Component Value Date  ? HGBA1C 5.6 03/12/2021  ? ? ? ?Assessment/Plan ?1. Primary osteoarthritis of left hand ?-worsening pain to IP joint. Medication, gel and ice have not been effective.  ?- AMB referral to orthopedics ? ?2. Anemia, unspecified type ?--will follow up cbc, not on supplement.  ? ?3. Depression, major, single episode, complete remission ([place]) ?Stable on zoloft.  ? ?4. Essential hypertension ?-Blood pressure well controlled ?Continue current medications ?Recheck metabolic panel ?- CMP with eGFR([facility]) ?- CBC with Differential/Platelet ? ?5. Mixed hyperlipidemia ?-continues on crestor 10 mg daily, no side effect notd.  ?- Lipid panel ?- CMP with eGFR([facility]) ? ?6. Class 1 obesity due to excess calories without serious comorbidity with body mass index (BMI) of 31.0 to 31.9 in adult ?--education provided on healthy weight loss through increase in physical activity and proper nutrition  ? ?7. Estrogen deficiency ?- DG Bone Density; Future ? ? ?Return in about 6 months (around 03/20/2022) for routine follow up . ?Carlos American. Dewaine Oats, AGNP ? ?[facility] ?[phone]  ?

## 2021-10-04 ENCOUNTER — Ambulatory Visit (INDEPENDENT_AMBULATORY_CARE_PROVIDER_SITE_OTHER): Payer: Self-pay | Admitting: Nurse Practitioner

## 2021-10-04 ENCOUNTER — Encounter: Payer: Self-pay | Admitting: Nurse Practitioner

## 2021-10-04 DIAGNOSIS — Z0000 Encounter for general adult medical examination without abnormal findings: Secondary | ICD-10-CM

## 2021-10-04 NOTE — Patient Instructions (Signed)
Alejandra Crane , Thank you for taking time to come for your Medicare Wellness Visit. I appreciate your ongoing commitment to your health goals. Please review the following plan we discussed and let me know if I can assist you in the future.   Screening recommendations/referrals: Colonoscopy up to date Mammogram you have this scheduled.  Bone Density you have this scheduled  Recommended yearly ophthalmology/optometry visit for glaucoma screening and checkup Recommended yearly dental visit for hygiene and checkup  Vaccinations: Influenza vaccine up to date Pneumococcal vaccine up to date Tdap vaccine up to date Shingles vaccine up to date    Advanced directives: on file.   Conditions/risks identified: hyperlipidemia, hypertension  Next appointment: yearly for awv   Preventive Care 65 Years and Older, Female Preventive care refers to lifestyle choices and visits with your health care provider that can promote health and wellness. What does preventive care include? A yearly physical exam. This is also called an annual well check. Dental exams once or twice a year. Routine eye exams. Ask your health care provider how often you should have your eyes checked. Personal lifestyle choices, including: Daily care of your teeth and gums. Regular physical activity. Eating a healthy diet. Avoiding tobacco and drug use. Limiting alcohol use. Practicing safe sex. Taking low-dose aspirin every day. Taking vitamin and mineral supplements as recommended by your health care provider. What happens during an annual well check? The services and screenings done by your health care provider during your annual well check will depend on your age, overall health, lifestyle risk factors, and family history of disease. Counseling  Your health care provider may ask you questions about your: Alcohol use. Tobacco use. Drug use. Emotional well-being. Home and relationship well-being. Sexual  activity. Eating habits. History of falls. Memory and ability to understand (cognition). Work and work Astronomer. Reproductive health. Screening  You may have the following tests or measurements: Height, weight, and BMI. Blood pressure. Lipid and cholesterol levels. These may be checked every 5 years, or more frequently if you are over 76 years old. Skin check. Lung cancer screening. You may have this screening every year starting at age 35 if you have a 30-pack-year history of smoking and currently smoke or have quit within the past 15 years. Fecal occult blood test (FOBT) of the stool. You may have this test every year starting at age 35. Flexible sigmoidoscopy or colonoscopy. You may have a sigmoidoscopy every 5 years or a colonoscopy every 10 years starting at age 65. Hepatitis C blood test. Hepatitis B blood test. Sexually transmitted disease (STD) testing. Diabetes screening. This is done by checking your blood sugar (glucose) after you have not eaten for a while (fasting). You may have this done every 1-3 years. Bone density scan. This is done to screen for osteoporosis. You may have this done starting at age 65. Mammogram. This may be done every 1-2 years. Talk to your health care provider about how often you should have regular mammograms. Talk with your health care provider about your test results, treatment options, and if necessary, the need for more tests. Vaccines  Your health care provider may recommend certain vaccines, such as: Influenza vaccine. This is recommended every year. Tetanus, diphtheria, and acellular pertussis (Tdap, Td) vaccine. You may need a Td booster every 10 years. Zoster vaccine. You may need this after age 40. Pneumococcal 13-valent conjugate (PCV13) vaccine. One dose is recommended after age 66. Pneumococcal polysaccharide (PPSV23) vaccine. One dose is recommended after age 24.  Talk to your health care provider about which screenings and vaccines  you need and how often you need them. This information is not intended to replace advice given to you by your health care provider. Make sure you discuss any questions you have with your health care provider. Document Released: 05/19/2015 Document Revised: 01/10/2016 Document Reviewed: 02/21/2015 Elsevier Interactive Patient Education  2017 [place] Prevention in the Home Falls can cause injuries. They can happen to people of all ages. There are many things you can do to make your home safe and to help prevent falls. What can I do on the outside of my home? Regularly fix the edges of walkways and driveways and fix any cracks. Remove anything that might make you trip as you walk through a door, such as a raised step or threshold. Trim any bushes or trees on the path to your home. Use bright outdoor lighting. Clear any walking paths of anything that might make someone trip, such as rocks or tools. Regularly check to see if handrails are loose or broken. Make sure that both sides of any steps have handrails. Any raised decks and porches should have guardrails on the edges. Have any leaves, snow, or ice cleared regularly. Use sand or salt on walking paths during winter. Clean up any spills in your garage right away. This includes oil or grease spills. What can I do in the bathroom? Use night lights. Install grab bars by the toilet and in the tub and shower. Do not use towel bars as grab bars. Use non-skid mats or decals in the tub or shower. If you need to sit down in the shower, use a plastic, non-slip stool. Keep the floor dry. Clean up any water that spills on the floor as soon as it happens. Remove soap buildup in the tub or shower regularly. Attach bath mats securely with double-sided non-slip rug tape. Do not have throw rugs and other things on the floor that can make you trip. What can I do in the bedroom? Use night lights. Make sure that you have a light by your bed that  is easy to reach. Do not use any sheets or blankets that are too big for your bed. They should not hang down onto the floor. Have a firm chair that has side arms. You can use this for support while you get dressed. Do not have throw rugs and other things on the floor that can make you trip. What can I do in the kitchen? Clean up any spills right away. Avoid walking on wet floors. Keep items that you use a lot in easy-to-reach places. If you need to reach something above you, use a strong step stool that has a grab bar. Keep electrical cords out of the way. Do not use floor polish or wax that makes floors slippery. If you must use wax, use non-skid floor wax. Do not have throw rugs and other things on the floor that can make you trip. What can I do with my stairs? Do not leave any items on the stairs. Make sure that there are handrails on both sides of the stairs and use them. Fix handrails that are broken or loose. Make sure that handrails are as long as the stairways. Check any carpeting to make sure that it is firmly attached to the stairs. Fix any carpet that is loose or worn. Avoid having throw rugs at the top or bottom of the stairs. If you do have throw  rugs, attach them to the floor with carpet tape. Make sure that you have a light switch at the top of the stairs and the bottom of the stairs. If you do not have them, ask someone to add them for you. What else can I do to help prevent falls? Wear shoes that: Do not have high heels. Have rubber bottoms. Are comfortable and fit you well. Are closed at the toe. Do not wear sandals. If you use a stepladder: Make sure that it is fully opened. Do not climb a closed stepladder. Make sure that both sides of the stepladder are locked into place. Ask someone to hold it for you, if possible. Clearly mark and make sure that you can see: Any grab bars or handrails. First and last steps. Where the edge of each step is. Use tools that help you  move around (mobility aids) if they are needed. These include: Canes. Walkers. Scooters. Crutches. Turn on the lights when you go into a dark area. Replace any light bulbs as soon as they burn out. Set up your furniture so you have a clear path. Avoid moving your furniture around. If any of your floors are uneven, fix them. If there are any pets around you, be aware of where they are. Review your medicines with your doctor. Some medicines can make you feel dizzy. This can increase your chance of falling. Ask your doctor what other things that you can do to help prevent falls. This information is not intended to replace advice given to you by your health care provider. Make sure you discuss any questions you have with your health care provider. Document Released: 02/16/2009 Document Revised: 09/28/2015 Document Reviewed: 05/27/2014 Elsevier Interactive Patient Education  2017 [employer].

## 2021-10-04 NOTE — Progress Notes (Signed)
Subjective:   Alejandra Crane is a 71 y.o. female who presents for Medicare Annual (Subsequent) preventive examination.  Review of Systems     Cardiac Risk Factors include: none     Objective:    There were no vitals filed for this visit. There is no height or weight on file to calculate BMI.     10/04/2021    2:58 PM 09/17/2021    8:40 AM 03/12/2021   10:04 AM 10/03/2020   10:36 AM 09/13/2020   10:19 AM 03/15/2020    1:10 PM 08/27/2019   11:04 AM  Advanced Directives  Does Patient Have a Medical Advance Directive? Yes Yes Yes Yes Yes Yes Yes  Type of Estate agentAdvance Directive Healthcare Power of NorveltAttorney;Living will Healthcare Power of MartinAttorney;Living will Healthcare Power of CommodoreAttorney;Living will Healthcare Power of OaksAttorney;Living will Healthcare Power of BaragaAttorney;Living will Healthcare Power of Attorney Living will;Healthcare Power of Attorney  Does patient want to make changes to medical advance directive? No - Patient declined No - Patient declined No - Patient declined No - Patient declined No - Patient declined No - Patient declined   Copy of Healthcare Power of Attorney in Chart? Yes - validated most recent copy scanned in chart (See row information) Yes - validated most recent copy scanned in chart (See row information) Yes - validated most recent copy scanned in chart (See row information) Yes - validated most recent copy scanned in chart (See row information) Yes - validated most recent copy scanned in chart (See row information) Yes - validated most recent copy scanned in chart (See row information) Yes - validated most recent copy scanned in chart (See row information)    Current Medications (verified) Outpatient Encounter Medications as of 10/04/2021  Medication Sig   hydrochlorothiazide (HYDRODIURIL) 25 MG tablet TAKE 1 TABLET BY MOUTH DAILY.   rosuvastatin (CRESTOR) 10 MG tablet Take 1 tablet (10 mg total) by mouth daily.   sertraline (ZOLOFT) 100 MG tablet TAKE 1 TABLET BY  MOUTH DAILY.   valACYclovir (VALTREX) 500 MG tablet Take 1 tablet (500 mg total) by mouth daily.   No facility-administered encounter medications on file as of 10/04/2021.    Allergies (verified) Mobic [meloxicam]   History: Past Medical History:  Diagnosis Date   Anemia    Anxiety    Arthritis    Asthma    Depression    Diverticulosis    noted on colonscopy    Elevated cholesterol    GERD (gastroesophageal reflux disease)    H/O mammogram    2015, 2016   Hepatitis    Herpes    Not gential   High blood pressure    History of esophagogastroduodenoscopy (EGD)    2014, 2016   History of PFTs 10/26/2012   Lower GI bleed 01/19/2015   Related to mobic 15 mg    Mitral valve prolapse 09/06/1996   Osteoarthritis of both knees    Pneumonia    Past Surgical History:  Procedure Laterality Date   BREAST EXCISIONAL BIOPSY Left    benign   BREAST SURGERY  05/20/2007   [facility], Dr.William Purkert    COLONOSCOPY     2014, 2016 Dr.Bajani Manalo, Dr.Mahmood Abedi    KNEE ARTHROSCOPY Right    Family History  Problem Relation Age of Onset   Heart attack Mother    CVA Mother    Diabetes Mother    Congestive Heart Failure Mother    Stroke Mother    Heart disease Mother  Hyperlipidemia Mother    Hypertension Mother    Depression Mother    Obesity Mother    Lung cancer Sister 61   Stomach cancer Brother    Neurologic Disorder Son    Colon cancer Niece    Diabetes Maternal Grandmother    Social History   Socioeconomic History   Marital status: Divorced    Spouse name: Not on file   Number of children: 0   Years of education: Not on file   Highest education level: Not on file  Occupational History   Occupation: RN  Tobacco Use   Smoking status: Former    Packs/day: 0.25    Years: 10.00    Pack years: 2.50    Types: Cigarettes    Quit date: 09/16/1986    Years since quitting: 35.0   Smokeless tobacco: Never   Tobacco comments:    Quit in her 70's   Vaping Use    Vaping Use: Never used  Substance and Sexual Activity   Alcohol use: Yes    Comment: Occasionally red wine    Drug use: No   Sexual activity: Not Currently  Other Topics Concern   Not on file  Social History Narrative   Diet: Regular       Caffeine: Yes, infrequently when working       Married, if yes what year: Divorced, married in 1974      Do you live in a house, apartment, assisted living, condo, trailer, ect: House, 1 stories, one person       Pets: None      Current/Past profession:       Exercise: Yes, walking, hiking, and gym    Right handed   One story home   Drinks caffeine, occasionally weekly      Living Will: No   DNR: No   POA/HPOA: No      Questions below not in new patient packet    Functional Status:   Do you have difficulty bathing or dressing yourself?   Do you have difficulty preparing food or eating?   Do you have difficulty managing your medications?   Do you have difficulty managing your finances?   Do you have difficulty affording your medications?   Social Determinants of Health   Financial Resource Strain: Not on file  Food Insecurity: Not on file  Transportation Needs: Not on file  Physical Activity: Not on file  Stress: Not on file  Social Connections: Not on file    Tobacco Counseling Counseling given: Not Answered Tobacco comments: Quit in her 50's    Clinical Intake:  Pre-visit preparation completed: Yes  Pain : No/denies pain     BMI - recorded: 31.99 Nutritional Status: BMI 25 -29 Overweight Nutritional Risks: None Diabetes: No  How often do you need to have someone help you when you read instructions, pamphlets, or other written materials from your doctor or pharmacy?: 1 - Never What is the last grade level you completed in school?: BSN  Diabetic?no  Interpreter Needed?: No  Information entered by :: Porsha McClurkin,CMA   Activities of Daily Living    10/04/2021    3:00 PM  In your present state of  health, do you have any difficulty performing the following activities:  Hearing? 0  Vision? 0  Comment Wear glasses  Difficulty concentrating or making decisions? 0  Walking or climbing stairs? 0  Dressing or bathing? 0  Doing errands, shopping? 0  Preparing Food and eating ? N  Using the Toilet? N  In the past six months, have you accidently leaked urine? N  Do you have problems with loss of bowel control? N  Managing your Medications? N  Managing your Finances? N  Housekeeping or managing your Housekeeping? N    Patient Care Team: [name], NP as PCP - General (Geriatric Medicine) Drema Dallas, DO as Consulting Physician (Neurology)  Indicate any recent Medical Services you may have received from other than [facility] providers in the past year (date may be approximate).     Assessment:   This is a routine wellness examination for Alejandra Crane.  Hearing/Vision screen Hearing Screening - Comments:: No hearing concerns Vision Screening - Comments:: Wear glasses  Dietary issues and exercise activities discussed: Current Exercise Habits: Home exercise routine, Type of exercise: walking, Time (Minutes): 60, Frequency (Times/Week): 3, Weekly Exercise (Minutes/Week): 180, Intensity: Mild, Exercise limited by: None identified   Goals Addressed   None    Depression Screen    10/04/2021    3:00 PM 03/12/2021   10:10 AM 10/03/2020   10:33 AM 03/15/2020    1:09 PM 06/22/2019    9:00 AM 03/29/2019    9:46 AM 09/16/2018   10:25 AM  PHQ 2/9 Scores  PHQ - 2 Score 0 0 0 0 6 0 0  PHQ- 9 Score     19      Fall Risk    10/04/2021    2:59 PM 09/17/2021   10:37 AM 03/12/2021   10:10 AM 10/03/2020   10:35 AM 03/15/2020    1:09 PM  Fall Risk   Falls in the past year? 0 0 0 0 0  Number falls in past yr: 0 0 0 0 0  Injury with Fall? 0 0 0 0 0  Risk for fall due to : No Fall Risks No Fall Risks No Fall Risks    Follow up Falls evaluation completed Falls evaluation completed Falls  evaluation completed      FALL RISK PREVENTION PERTAINING TO THE HOME:  Any stairs in or around the home? No  If so, are there any without handrails?  na Home free of loose throw rugs in walkways, pet beds, electrical cords, etc? Yes  Adequate lighting in your home to reduce risk of falls? Yes   ASSISTIVE DEVICES UTILIZED TO PREVENT FALLS:  Life alert? Yes apple watch Use of a cane, walker or w/c? No  Grab bars in the bathroom? No  Shower chair or bench in shower? Yes  Elevated toilet seat or a handicapped toilet? No   TIMED UP AND GO:  Was the test performed? No .    Cognitive Function:    03/23/2018   10:58 AM 03/21/2017    9:14 AM  MMSE - Mini Mental State Exam  Orientation to time 5 5  Orientation to Place 5 5  Registration 3 3  Attention/ Calculation 5 5  Recall 3 3  Language- name 2 objects 2 2  Language- repeat 1 1  Language- follow 3 step command 3 3  Language- read & follow direction 1 1  Write a sentence 1 1  Copy design 0 1  Total score 29 30        10/04/2021    3:04 PM 10/03/2020   10:36 AM  6CIT Screen  What Year? 0 points 0 points  What month? 0 points 0 points  What time? 0 points 0 points  Count back from 20 0 points 0 points  Months in reverse 0 points 0 points  Repeat phrase 0 points 0 points  Total Score 0 points 0 points    Immunizations Immunization History  Administered Date(s) Administered   Fluad Quad(high Dose 65+) 02/22/2020, 02/27/2021   Hepatitis A 01/21/2007, 04/12/2008   Influenza, High Dose Seasonal PF 02/25/2018, 02/04/2019   Influenza, Seasonal, Injecte, Preservative Fre 02/05/2017   PFIZER Comirnaty(Gray Top)Covid-19 Tri-Sucrose Vaccine 11/08/2020   PFIZER(Purple Top)SARS-COV-2 Vaccination 05/05/2019, 05/26/2019, 03/17/2020   Pneumococcal Conjugate-13 12/25/2015   Pneumococcal Polysaccharide-23 01/13/2012, 03/21/2017   Td 03/06/2005, 01/23/2012   Tdap 10/18/2014   Zoster Recombinat (Shingrix) 05/19/2018, 11/03/2018    Zoster, Live 05/06/2012    TDAP status: Up to date  Flu Vaccine status: Up to date  Pneumococcal vaccine status: Up to date  Covid-19 vaccine status: Completed vaccines  Qualifies for Shingles Vaccine? Yes   Zostavax completed No   Shingrix Completed?: Yes  Screening Tests Health Maintenance  Topic Date Due   COVID-19 Vaccine (5 - Booster for Pfizer series) 01/03/2021   INFLUENZA VACCINE  12/04/2021   MAMMOGRAM  06/21/2022   COLONOSCOPY (Pts 45-52yrs Insurance coverage will need to be confirmed)  05/06/2024   TETANUS/TDAP  10/17/2024   Pneumonia Vaccine 63+ Years old  Completed   DEXA SCAN  Completed   Hepatitis C Screening  Completed   Zoster Vaccines- Shingrix  Completed   HPV VACCINES  Aged Out    Health Maintenance  Health Maintenance Due  Topic Date Due   COVID-19 Vaccine (5 - Booster for Pfizer series) 01/03/2021    Colorectal cancer screening: Type of screening: Colonoscopy. Completed 2016. Repeat every 10 years  Mammogram status: Ordered and scheduled. Pt provided with contact info and advised to call to schedule appt.   Bone Density status: Ordered and scheduled for November. Pt provided with contact info and advised to call to schedule appt.  Lung Cancer Screening: (Low Dose CT Chest recommended if Age 70-80 years, 30 pack-year currently smoking OR have quit w/in 15years.) does not qualify.     Additional Screening:  Hepatitis C Screening: does qualify; Completed 2017   Vision Screening: Recommended annual ophthalmology exams for early detection of glaucoma and other disorders of the eye. Is the patient up to date with their annual eye exam?  Yes  Who is the provider or what is the name of the office in which the patient attends annual eye exams? [facility] If pt is not established with a provider, would they like to be referred to a provider to establish care? No .   Dental Screening: Recommended annual dental exams for proper oral  hygiene  Community Resource Referral / Chronic Care Management: CRR required this visit?  No   CCM required this visit?  No      Plan:     I have personally reviewed and noted the following in the patient's chart:   Medical and social history Use of alcohol, tobacco or illicit drugs  Current medications and supplements including opioid prescriptions.  Functional ability and status Nutritional status Physical activity Advanced directives List of other physicians Hospitalizations, surgeries, and ER visits in previous 12 months Vitals Screenings to include cognitive, depression, and falls Referrals and appointments  In addition, I have reviewed and discussed with patient certain preventive protocols, quality metrics, and best practice recommendations. A written personalized care plan for preventive services as well as general preventive health recommendations were provided to patient.     [name], NP   10/04/2021  Virtual Visit via Telephone Note  I connected with patient 10/04/21 at  3:20 PM EDT by telephone and verified that I am speaking with the correct person using two identifiers.  Location: Patient: home Provider: [facility]   I discussed the limitations, risks, security and privacy concerns of performing an evaluation and management service by telephone and the availability of in person appointments. I also discussed with the patient that there may be a patient responsible charge related to this service. The patient expressed understanding and agreed to proceed.   I discussed the assessment and treatment plan with the patient. The patient was provided an opportunity to ask questions and all were answered. The patient agreed with the plan and demonstrated an understanding of the instructions.   The patient was advised to call back or seek an in-person evaluation if the symptoms worsen or if the condition fails to improve as anticipated.  I provided 15  minutes of non-face-to-face time during this encounter.  Janene Harvey. [employer] printed and mailed

## 2021-10-05 ENCOUNTER — Encounter: Payer: Medicare Other | Admitting: Nurse Practitioner

## 2021-10-09 ENCOUNTER — Ambulatory Visit: Payer: 59 | Admitting: Orthopedic Surgery

## 2021-10-09 ENCOUNTER — Encounter: Payer: Self-pay | Admitting: Orthopedic Surgery

## 2021-10-09 ENCOUNTER — Ambulatory Visit (INDEPENDENT_AMBULATORY_CARE_PROVIDER_SITE_OTHER): Payer: 59

## 2021-10-09 VITALS — BP 111/73 | HR 60 | Ht 63.0 in | Wt 181.0 lb

## 2021-10-09 DIAGNOSIS — M79642 Pain in left hand: Secondary | ICD-10-CM

## 2021-10-09 DIAGNOSIS — M1812 Unilateral primary osteoarthritis of first carpometacarpal joint, left hand: Secondary | ICD-10-CM

## 2021-10-09 MED ORDER — BETAMETHASONE SOD PHOS & ACET 6 (3-3) MG/ML IJ SUSP
3.0000 mg | INTRAMUSCULAR | Status: AC | PRN
  Administered 2021-10-09: 3 mg via INTRA_ARTICULAR

## 2021-10-09 MED ORDER — LIDOCAINE HCL 1 % IJ SOLN
0.5000 mL | INTRAMUSCULAR | Status: AC | PRN
  Administered 2021-10-09: .5 mL

## 2021-10-09 NOTE — Progress Notes (Signed)
Comment  Office Visit Note   Patient: Alejandra Crane           Date of Birth: [birth year]           MRN: [id number] Visit Date: 10/09/2021              Requested by: [name], NP [address] [place],  [place] [address] PCP: [name], NP   Assessment & Plan: Visit Diagnoses:  1. Pain in left hand   2. Arthritis of carpometacarpal (CMC) joint of left thumb     Plan: We reviewed her x-rays which show E[place] stage III/IV osteoarthritis of the thumb CMC joint.  We discussed the nature of CMC arthritis as well as its diagnosis, prognosis, and both conservative and surgical treatment options.  After discussion, she would like to try corticosteroid injection into the thumb CMC joint.  We reviewed the risks of injections including bleeding, infection, alterations in blood sugar, and incomplete symptom relief.  I can see her back in the office in 6 to 8 weeks or when her symptoms return.  Follow-Up Instructions: No follow-ups on file.   Orders:  Orders Placed This Encounter  Procedures   XR Hand Complete Left   No orders of the defined types were placed in this encounter.     Procedures: Hand/UE Inj: R thumb CMC for osteoarthritis on 10/09/2021 10:35 AM Indications: pain and therapeutic Details: 25 G needle, fluoroscopy-guided radial approach Medications: 0.5 mL lidocaine 1 %; 3 mg betamethasone acetate-betamethasone sodium phosphate 6 (3-3) MG/ML Procedure, treatment alternatives, risks and benefits explained, specific risks discussed. Consent was given by the patient. Immediately prior to procedure a time out was called to verify the correct patient, procedure, equipment, support staff and site/side marked as required. Patient was prepped and draped in the usual sterile fashion.      Clinical Data: No additional findings.   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  This been going on for about a year or so now.  Is progressively gotten worse.  She describes pain at the base of her thumb at the [facility] joint.  She notes intermittent swelling that depends on her activity level.  This often wakes her up at night.  She is difficulty with tasks at work such as opening pill bottles.  She also has pain with sewing which is a hobby of hers.  She has tried ice with some some relief.  She is never tried bracing or oral anti-inflammatory medications.  She denies pain at the MCP joint.   Review of Systems   Objective: Vital Signs: BP 111/73 (BP Location: Left Arm, Patient Position: Sitting)   Pulse 60   Ht 5\' 3"  (1.6 m)   Wt 181 lb (82.1 kg)   BMI 32.06 kg/m   Physical Exam Constitutional:      Appearance: Normal appearance.  Cardiovascular:     Rate and Rhythm: Normal rate.     Pulses: Normal pulses.  Pulmonary:     Effort: Pulmonary effort is normal.  Skin:    General: Skin is warm and dry.     Capillary Refill: Capillary refill takes less than 2 seconds.  Neurological:     Mental Status: She is  alert.    Left Hand Exam   Tenderness  Left hand tenderness location: TTP at thumb CMC joint w/ moderate swelling.   Other  Erythema: absent Sensation: normal Pulse: present  Comments:  Pain and crepitus with CMC grind test.  No static or dynamic MCP hyper-extension.  No pain w/ P/AROM of MCP joint.  Mild palmar abduction contracture.      Specialty Comments:  No specialty comments available.  Imaging: No results found.   PMFS History: Patient Active Problem List   Diagnosis Date Noted   Arthritis of carpometacarpal [facility]) joint of left thumb 10/09/2021   History of GI diverticular bleed 09/16/2019   Acute lower GI bleeding 08/22/2019   Orthostatic syncope 08/22/2019   Osteopenia 09/16/2018   Essential  hypertension 02/19/2017   Mixed hyperlipidemia 02/19/2017   Depression, major, single episode, complete remission (HCC) 02/19/2017   Herpes genitalis in women 02/19/2017   Past Medical History:  Diagnosis Date   Anemia    Anxiety    Arthritis    Asthma    Depression    Diverticulosis    noted on colonscopy    Elevated cholesterol    GERD (gastroesophageal reflux disease)    H/O mammogram    2015, 2016   Hepatitis    Herpes    Not gential   High blood pressure    History of esophagogastroduodenoscopy (EGD)    2014, 2016   History of PFTs 10/26/2012   Lower GI bleed 01/19/2015   Related to mobic 15 mg    Mitral valve prolapse 09/06/1996   Osteoarthritis of both knees    Pneumonia     Family History  Problem Relation Age of Onset   Heart attack Mother    CVA Mother    Diabetes Mother    Congestive Heart Failure Mother    Stroke Mother    Heart disease Mother    Hyperlipidemia Mother    Hypertension Mother    Depression Mother    Obesity Mother    Lung cancer Sister 91   Stomach cancer Brother    Neurologic Disorder Son    Colon cancer Niece    Diabetes Maternal Grandmother     Past Surgical History:  Procedure Laterality Date   BREAST EXCISIONAL BIOPSY Left    benign   BREAST SURGERY  05/20/2007   [facility], Dr.William Purkert    COLONOSCOPY     2014, 2016 Dr.Bajani Manalo, Dr.Mahmood Abedi    KNEE ARTHROSCOPY Right    Social History   Occupational History   Occupation: Charity fundraiser  Tobacco Use   Smoking status: Former    Packs/day: 0.25    Years: 10.00    Pack years: 2.50    Types: Cigarettes    Quit date: 09/16/1986    Years since quitting: 35.0   Smokeless tobacco: Never   Tobacco comments:    Quit in her 67's   Vaping Use   Vaping Use: Never used  Substance and Sexual Activity   Alcohol use: Yes    Comment: Occasionally red wine    Drug use: No   Sexual activity: Not Currently

## 2021-11-16 IMAGING — MG DIGITAL SCREENING BILAT W/ TOMO W/ CAD
8 series · 8 of 24 positions shown · non-contrast
Comparison: Previous exam(s).

CLINICAL DATA: Screening.

EXAM:
DIGITAL SCREENING BILATERAL MAMMOGRAM WITH TOMO AND CAD

[R CC synth-2D]
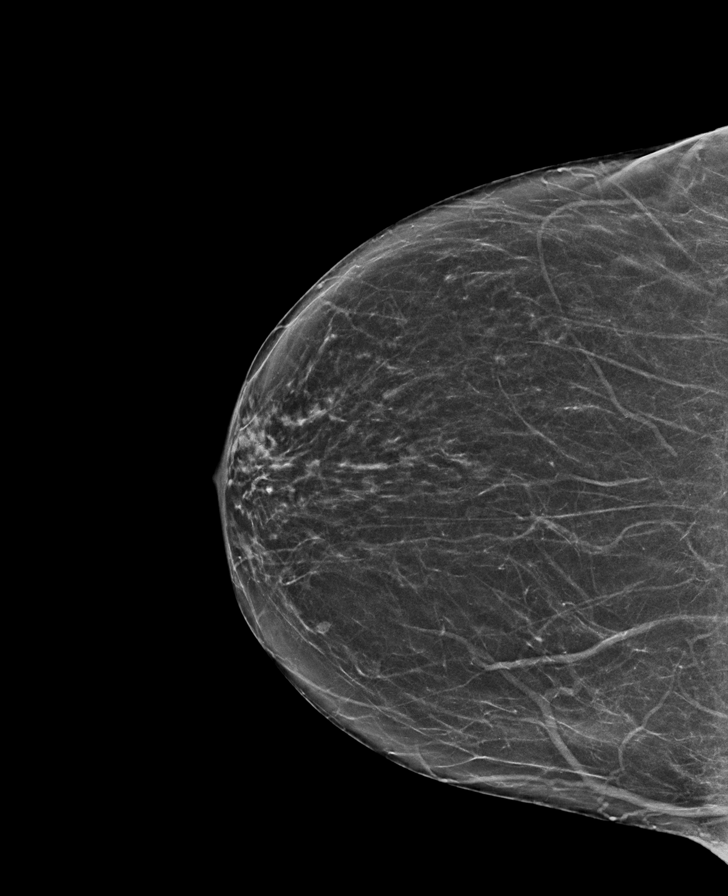

[L MLO synth-2D]
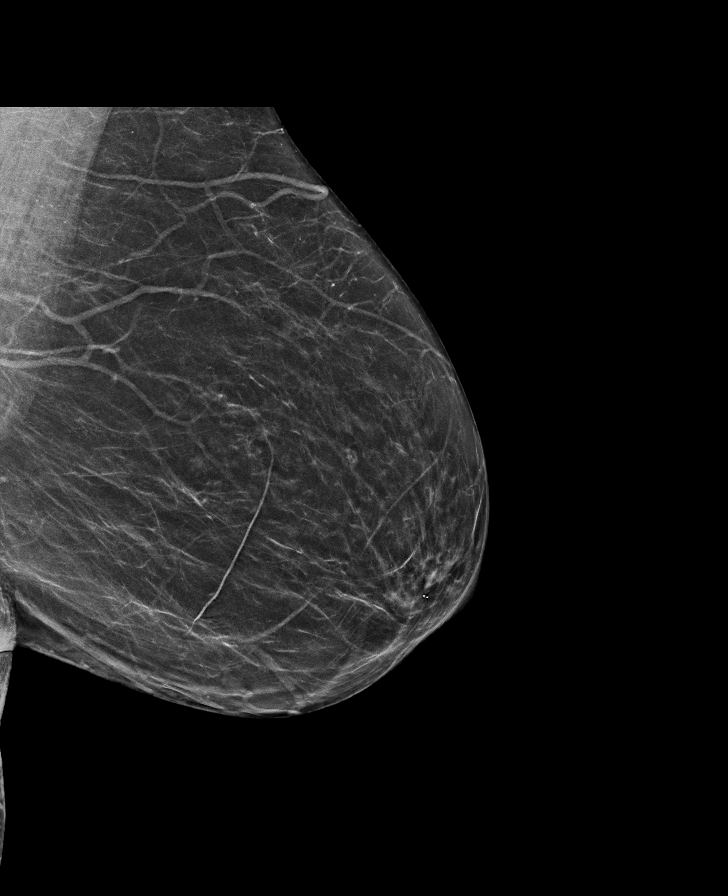

[R MLO synth-2D]
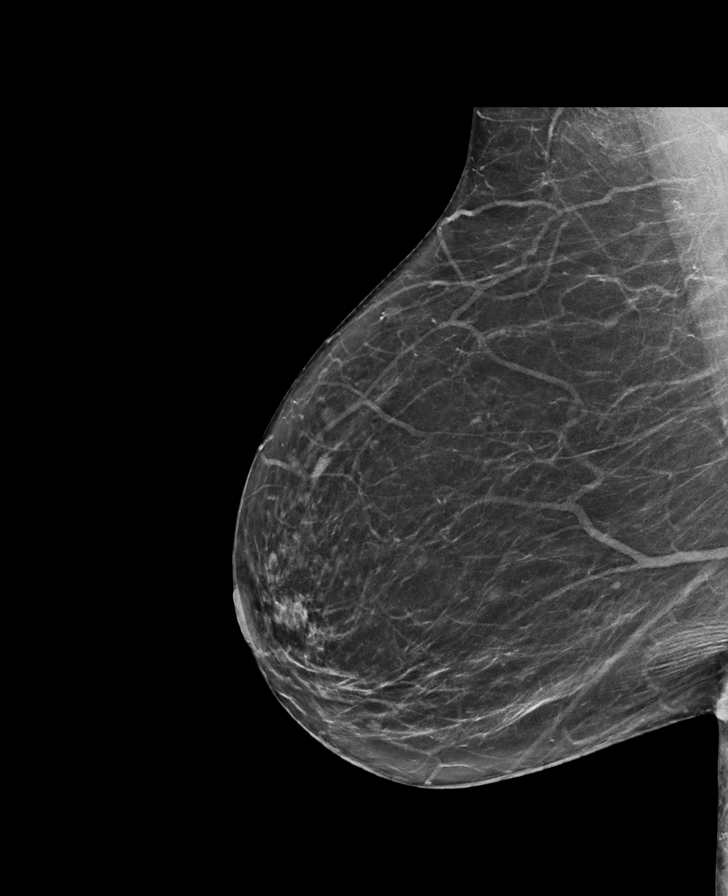

[L CC synth-2D]
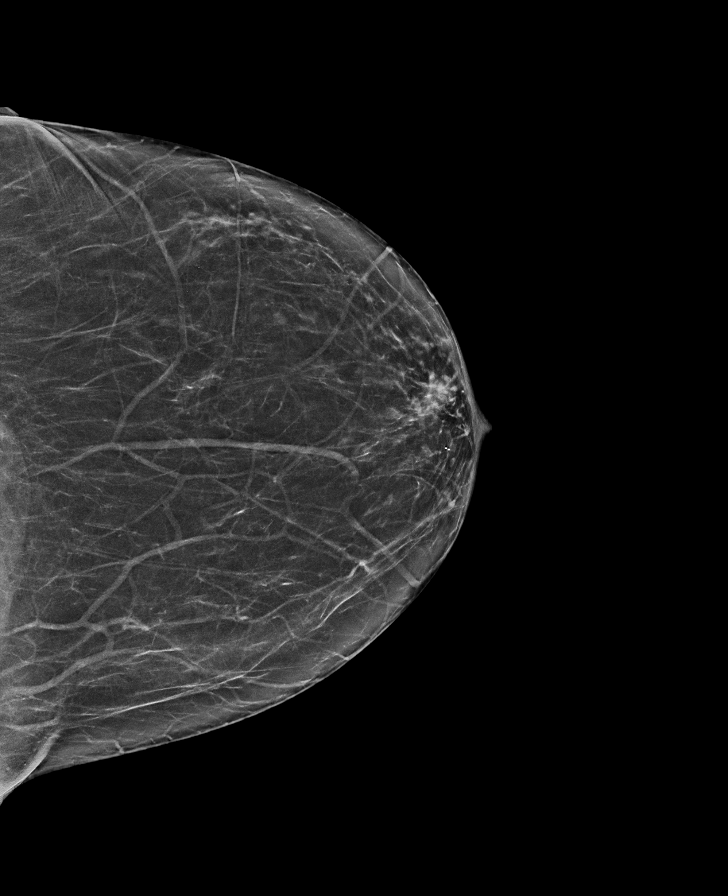

[R CC tomo · tomo slice 30/59.0]
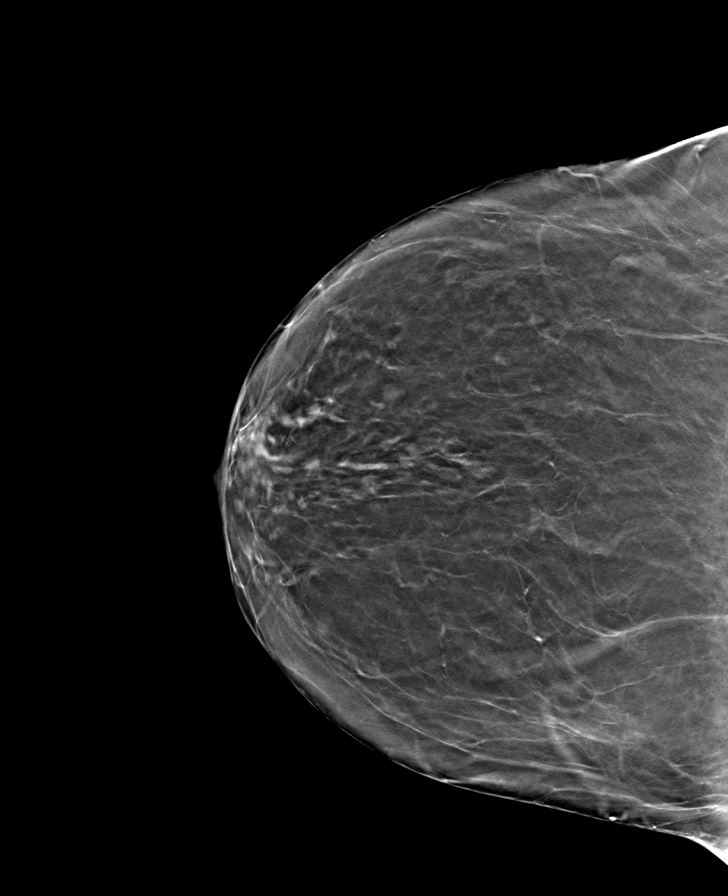

[L CC tomo · tomo slice 31/62.0]
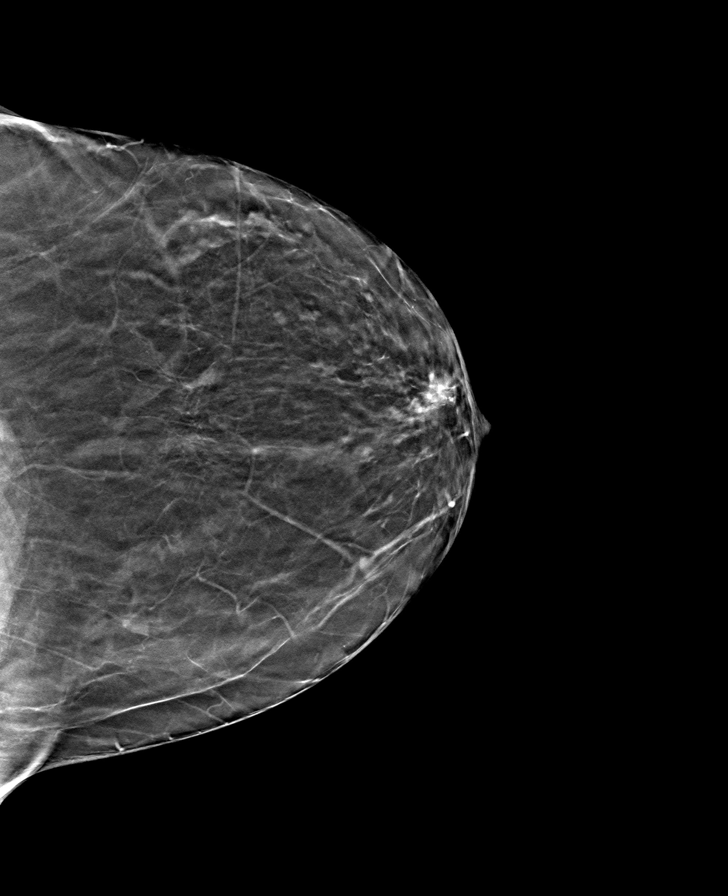

[R MLO tomo · tomo slice 35/68.0]
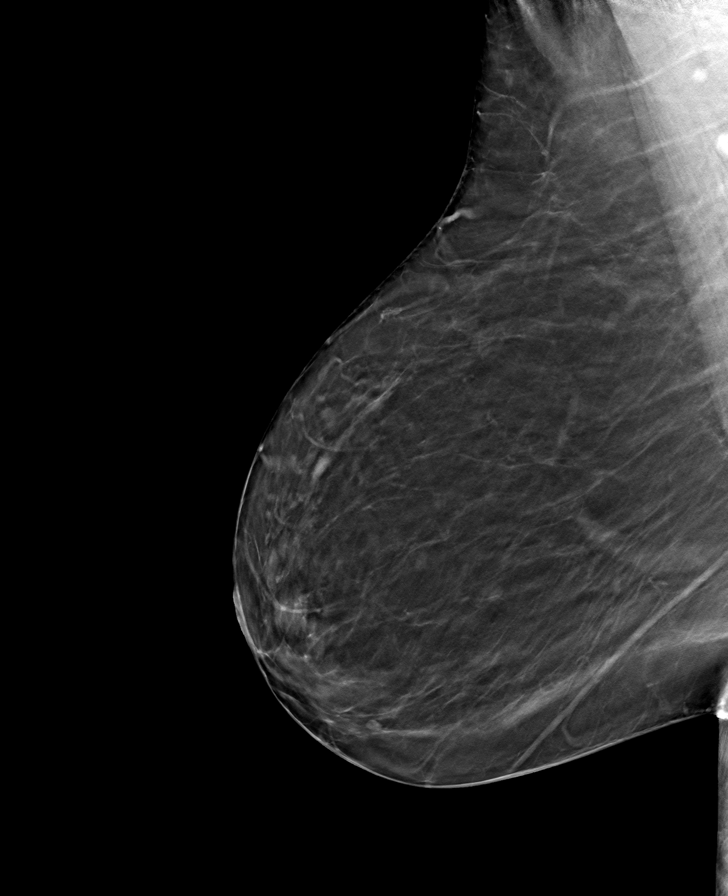

[L MLO tomo · tomo slice 33/64.0]
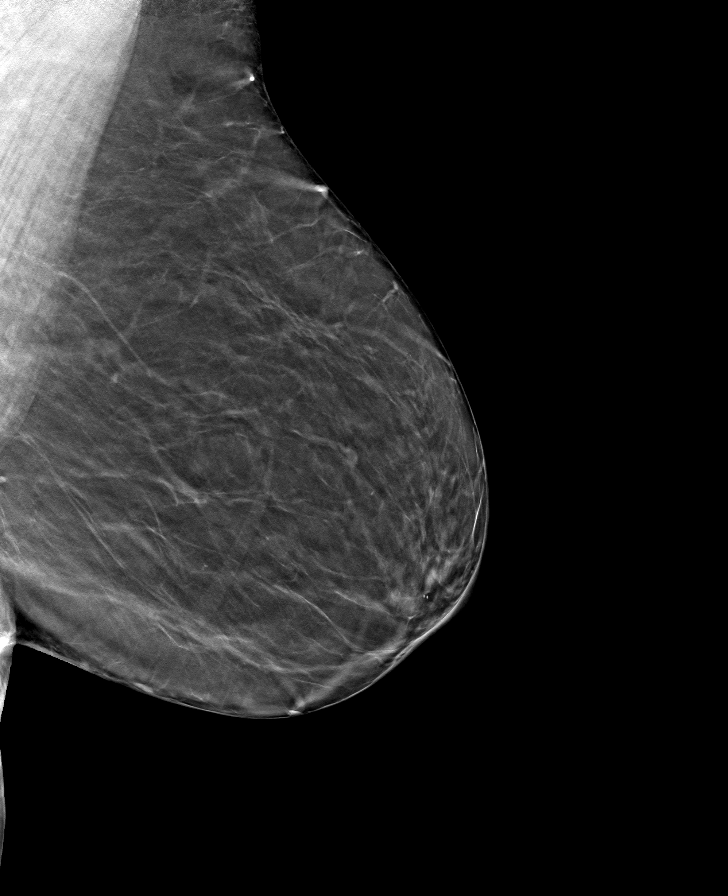

[8 of 24 positions shown; findings below may reference images not displayed]

ACR Breast Density Category b: There are scattered areas of
fibroglandular density.
FINDINGS: There are no findings suspicious for malignancy. Images were
processed with CAD.
IMPRESSION: No mammographic evidence of malignancy. A result letter of this
screening mammogram will be mailed directly to the patient.

RECOMMENDATION:
Screening mammogram in one year. (Code:[id number])

BI-RADS CATEGORY  1: Negative.

## 2021-11-26 ENCOUNTER — Other Ambulatory Visit: Payer: Self-pay | Admitting: Nurse Practitioner

## 2021-11-26 DIAGNOSIS — A6009 Herpesviral infection of other urogenital tract: Secondary | ICD-10-CM

## 2021-11-27 ENCOUNTER — Other Ambulatory Visit (HOSPITAL_COMMUNITY): Payer: Self-pay

## 2021-11-27 MED ORDER — VALACYCLOVIR HCL 500 MG PO TABS
500.0000 mg | ORAL_TABLET | Freq: Every day | ORAL | 1 refills | Status: DC
  Filled 2021-11-27: qty 90, 90d supply, fill #0
  Filled 2022-03-07: qty 90, 90d supply, fill #1

## 2021-12-03 ENCOUNTER — Ambulatory Visit
Admission: RE | Admit: 2021-12-03 | Discharge: 2021-12-03 | Disposition: A | Discharge status: Home / Self Care | Payer: 59 | Source: Ambulatory Visit | Attending: Nurse Practitioner | Admitting: Nurse Practitioner

## 2021-12-03 ENCOUNTER — Ambulatory Visit (INDEPENDENT_AMBULATORY_CARE_PROVIDER_SITE_OTHER): Payer: 59 | Admitting: Nurse Practitioner

## 2021-12-03 ENCOUNTER — Encounter: Payer: Self-pay | Admitting: Nurse Practitioner

## 2021-12-03 VITALS — BP 124/80 | HR 67 | Temp 97.5°F | Ht 63.0 in | Wt 181.0 lb

## 2021-12-03 DIAGNOSIS — M7989 Other specified soft tissue disorders: Secondary | ICD-10-CM | POA: Diagnosis not present

## 2021-12-03 DIAGNOSIS — M25571 Pain in right ankle and joints of right foot: Secondary | ICD-10-CM

## 2021-12-03 NOTE — Progress Notes (Signed)
Careteam: Patient Care Team: [name], NP as PCP - General (Geriatric Medicine) Drema Dallas, DO as Consulting Physician (Neurology)  PLACE OF SERVICE:  [facility]  Advanced Directive information    Allergies  Allergen Reactions   Mobic [Meloxicam]     GI bleed    Chief Complaint  Patient presents with   Acute Visit    Right ankle concerns x 3 weeks, patient thought something bit here. Pain is radiating up leg and into the other leg. No known injury.      HPI: Patient is a 71 y.o. female due to right ankle pain. Reports she has been having pain for the the last 3 weeks.  Reports pain has gotten up to 10.  Reports she is having weakness in ankle as well.  Now left leg is hurting  She has not tried anything for pain.  Reports area became swollen. No injury.  Not warm to touch.  Has not taken any medication  Review of Systems:  Review of Systems  Constitutional:  Negative for chills, fever and weight loss.  Musculoskeletal:  Positive for joint pain and myalgias.    Past Medical History:  Diagnosis Date   Anemia    Anxiety    Arthritis    Asthma    Depression    Diverticulosis    noted on colonscopy    Elevated cholesterol    GERD (gastroesophageal reflux disease)    H/O mammogram    2015, 2016   Hepatitis    Herpes    Not gential   High blood pressure    History of esophagogastroduodenoscopy (EGD)    2014, 2016   History of PFTs 10/26/2012   Lower GI bleed 01/19/2015   Related to mobic 15 mg    Mitral valve prolapse 09/06/1996   Osteoarthritis of both knees    Pneumonia    Past Surgical History:  Procedure Laterality Date   BREAST EXCISIONAL BIOPSY Left    benign   BREAST SURGERY  05/20/2007   [facility], Dr.William Purkert    COLONOSCOPY     2014, 2016 Dr.Bajani Manalo, Dr.Mahmood Abedi    KNEE ARTHROSCOPY Right    Social History:   reports that she quit smoking about 35 years ago. Her smoking use included cigarettes. She has a  2.50 pack-year smoking history. She has never used smokeless tobacco. She reports current alcohol use. She reports that she does not use drugs.  Family History  Problem Relation Age of Onset   Heart attack Mother    CVA Mother    Diabetes Mother    Congestive Heart Failure Mother    Stroke Mother    Heart disease Mother    Hyperlipidemia Mother    Hypertension Mother    Depression Mother    Obesity Mother    Lung cancer Sister 43   Stomach cancer Brother    Neurologic Disorder Son    Colon cancer Niece    Diabetes Maternal Grandmother     Medications: Patient's Medications  New Prescriptions   No medications on file  Previous Medications   HYDROCHLOROTHIAZIDE (HYDRODIURIL) 25 MG TABLET    TAKE 1 TABLET BY MOUTH DAILY.   ROSUVASTATIN (CRESTOR) 10 MG TABLET    Take 1 tablet (10 mg total) by mouth daily.   SERTRALINE (ZOLOFT) 100 MG TABLET    TAKE 1 TABLET BY MOUTH DAILY.   VALACYCLOVIR (VALTREX) 500 MG TABLET    Take 1 tablet (500 mg total)  by mouth daily.  Modified Medications   No medications on file  Discontinued Medications   No medications on file    Physical Exam:  Vitals:   12/03/21 1528  BP: 124/80  Pulse: 67  Temp: (!) 97.5 F (36.4 C)  TempSrc: Temporal  SpO2: 98%  Weight: 181 lb (82.1 kg)  Height: 5\' 3"  (1.6 m)   Body mass index is 32.06 kg/m. Wt Readings from Last 3 Encounters:  12/03/21 181 lb (82.1 kg)  10/09/21 181 lb (82.1 kg)  09/17/21 180 lb 9.6 oz (81.9 kg)    Physical Exam Constitutional:      Appearance: Normal appearance.  Pulmonary:     Effort: Pulmonary effort is normal.  Musculoskeletal:     Right ankle: Swelling (medial swelling) present. No deformity, ecchymosis or lacerations. Tenderness present over the medial malleolus and proximal fibula. Normal range of motion. Normal pulse.     Right Achilles Tendon: Normal.     Left ankle: Normal. Normal pulse.     Left Achilles Tendon: Normal.     Comments: Has ROM but tenderness with  movement.   Neurological:     Mental Status: She is alert. Mental status is at baseline.  Psychiatric:        Mood and Affect: Mood normal.     Labs reviewed: Basic Metabolic Panel: Recent Labs    03/12/21 1049 09/17/21 1012  NA 142 143  K 4.1 4.0  CL 107 108  CO2 32 31  GLUCOSE 85 83  BUN 19 18  CREATININE 0.87 0.82  CALCIUM 9.4 9.2   Liver Function Tests: Recent Labs    03/12/21 1049 09/17/21 1012  AST 17 16  ALT 11 11  BILITOT 0.3 0.3  PROT 6.6 6.4   No results for input(s): "LIPASE", "AMYLASE" in the last 8760 hours. No results for input(s): "AMMONIA" in the last 8760 hours. CBC: Recent Labs    03/12/21 1049 09/17/21 1012  WBC 6.4 5.9  NEUTROABS 3,302 2,938  HGB 11.8 12.2  HCT 37.5 37.6  MCV 96.4 96.7  PLT 268 264   Lipid Panel: Recent Labs    03/12/21 1049 09/17/21 1012  CHOL 217* 192  HDL 72 72  LDLCALC 130* 106*  TRIG 63 54  CHOLHDL 3.0 2.7   TSH: No results for input(s): "TSH" in the last 8760 hours. A1C: Lab Results  Component Value Date   HGBA1C 5.6 03/12/2021     Assessment/Plan 1. Acute right ankle pain She is getting ankle brace for support -rule out fracture and gout -ice TID ~20 mins -can not take any NSAIDs due to hx GI bleed.  -tylenol PRN pain.  - DG Ankle Complete Right; Future - CBC with Differential/Platelet - Uric Acid   Mahek Schlesinger K. 13/11/2020  [facility] [phone]

## 2021-12-03 NOTE — Patient Instructions (Signed)
To ice ankle three times daily ~20 mins  To go to [facility] from here to get xray

## 2021-12-04 LAB — URIC ACID: Uric Acid, Serum: 4.3 mg/dL (ref 2.5–7.0)

## 2021-12-04 LAB — CBC WITH DIFFERENTIAL/PLATELET
Absolute Monocytes: 626 cells/uL (ref 200–950)
Basophils Absolute: 50 cells/uL (ref 0–200)
Basophils Relative: 0.7 %
Eosinophils Absolute: 86 cells/uL (ref 15–500)
Eosinophils Relative: 1.2 %
HCT: 37.7 % (ref 35.0–45.0)
Hemoglobin: 12.4 g/dL (ref 11.7–15.5)
Lymphs Abs: 2714 cells/uL (ref 850–3900)
MCH: 31.2 pg (ref 27.0–33.0)
MCHC: 32.9 g/dL (ref 32.0–36.0)
MCV: 95 fL (ref 80.0–100.0)
MPV: 10.7 fL (ref 7.5–12.5)
Monocytes Relative: 8.7 %
Neutro Abs: 3722 cells/uL (ref 1500–7800)
Neutrophils Relative %: 51.7 %
Platelets: 261 10*3/uL (ref 140–400)
RBC: 3.97 10*6/uL (ref 3.80–5.10)
RDW: 13.2 % (ref 11.0–15.0)
Total Lymphocyte: 37.7 %
WBC: 7.2 10*3/uL (ref 3.8–10.8)

## 2021-12-12 ENCOUNTER — Encounter (INDEPENDENT_AMBULATORY_CARE_PROVIDER_SITE_OTHER): Payer: Self-pay

## 2022-02-12 DIAGNOSIS — H0102A Squamous blepharitis right eye, upper and lower eyelids: Secondary | ICD-10-CM | POA: Diagnosis not present

## 2022-02-12 DIAGNOSIS — H0102B Squamous blepharitis left eye, upper and lower eyelids: Secondary | ICD-10-CM | POA: Diagnosis not present

## 2022-02-12 DIAGNOSIS — H40013 Open angle with borderline findings, low risk, bilateral: Secondary | ICD-10-CM | POA: Diagnosis not present

## 2022-02-12 DIAGNOSIS — H43813 Vitreous degeneration, bilateral: Secondary | ICD-10-CM | POA: Diagnosis not present

## 2022-02-12 DIAGNOSIS — H1045 Other chronic allergic conjunctivitis: Secondary | ICD-10-CM | POA: Diagnosis not present

## 2022-02-12 DIAGNOSIS — H2513 Age-related nuclear cataract, bilateral: Secondary | ICD-10-CM | POA: Diagnosis not present

## 2022-02-19 ENCOUNTER — Other Ambulatory Visit: Payer: Self-pay

## 2022-02-26 ENCOUNTER — Ambulatory Visit (INDEPENDENT_AMBULATORY_CARE_PROVIDER_SITE_OTHER): Payer: 59 | Admitting: *Deleted

## 2022-02-26 DIAGNOSIS — Z23 Encounter for immunization: Secondary | ICD-10-CM | POA: Diagnosis not present

## 2022-03-06 ENCOUNTER — Other Ambulatory Visit: Payer: Self-pay

## 2022-03-06 ENCOUNTER — Ambulatory Visit
Admission: RE | Admit: 2022-03-06 | Discharge: 2022-03-06 | Disposition: A | Discharge status: Home / Self Care | Payer: 59 | Source: Ambulatory Visit | Attending: Nurse Practitioner | Admitting: Nurse Practitioner

## 2022-03-06 DIAGNOSIS — E2839 Other primary ovarian failure: Secondary | ICD-10-CM

## 2022-03-06 DIAGNOSIS — Z1231 Encounter for screening mammogram for malignant neoplasm of breast: Secondary | ICD-10-CM | POA: Diagnosis not present

## 2022-03-06 DIAGNOSIS — Z780 Asymptomatic menopausal state: Secondary | ICD-10-CM | POA: Diagnosis not present

## 2022-03-06 DIAGNOSIS — M85851 Other specified disorders of bone density and structure, right thigh: Secondary | ICD-10-CM | POA: Diagnosis not present

## 2022-03-06 MED ORDER — CALCIUM CARBONATE 600 MG PO TABS: 600.0000 mg | ORAL_TABLET | Freq: Two times a day (BID) | ORAL | 11 refills | Status: AC

## 2022-03-06 MED ORDER — VITAMIN D3 50 MCG (2000 UT) PO CAPS: 2000.0000 [IU] | ORAL_CAPSULE | Freq: Every day | ORAL | 11 refills | Status: AC

## 2022-03-07 ENCOUNTER — Other Ambulatory Visit (HOSPITAL_COMMUNITY): Payer: Self-pay

## 2022-03-07 ENCOUNTER — Other Ambulatory Visit: Payer: Self-pay | Admitting: Nurse Practitioner

## 2022-03-07 DIAGNOSIS — I10 Essential (primary) hypertension: Secondary | ICD-10-CM

## 2022-03-07 DIAGNOSIS — F325 Major depressive disorder, single episode, in full remission: Secondary | ICD-10-CM

## 2022-03-07 MED ORDER — SERTRALINE HCL 100 MG PO TABS
100.0000 mg | ORAL_TABLET | Freq: Every day | ORAL | 1 refills | Status: DC
  Filled 2022-03-07: qty 90, 90d supply, fill #0
  Filled 2022-06-02: qty 90, 90d supply, fill #1

## 2022-03-07 MED ORDER — HYDROCHLOROTHIAZIDE 25 MG PO TABS
25.0000 mg | ORAL_TABLET | Freq: Every day | ORAL | 1 refills | Status: DC
  Filled 2022-03-07: qty 90, 90d supply, fill #0
  Filled 2022-06-02: qty 90, 90d supply, fill #1

## 2022-03-18 ENCOUNTER — Encounter: Payer: Self-pay | Admitting: Nurse Practitioner

## 2022-03-18 ENCOUNTER — Ambulatory Visit (INDEPENDENT_AMBULATORY_CARE_PROVIDER_SITE_OTHER): Payer: 59 | Admitting: Nurse Practitioner

## 2022-03-18 VITALS — BP 126/74 | HR 64 | Temp 97.3°F | Ht 63.0 in | Wt 173.5 lb

## 2022-03-18 DIAGNOSIS — E6609 Other obesity due to excess calories: Secondary | ICD-10-CM

## 2022-03-18 DIAGNOSIS — I10 Essential (primary) hypertension: Secondary | ICD-10-CM | POA: Diagnosis not present

## 2022-03-18 DIAGNOSIS — G8929 Other chronic pain: Secondary | ICD-10-CM

## 2022-03-18 DIAGNOSIS — F325 Major depressive disorder, single episode, in full remission: Secondary | ICD-10-CM

## 2022-03-18 DIAGNOSIS — E782 Mixed hyperlipidemia: Secondary | ICD-10-CM | POA: Diagnosis not present

## 2022-03-18 DIAGNOSIS — M25571 Pain in right ankle and joints of right foot: Secondary | ICD-10-CM | POA: Diagnosis not present

## 2022-03-18 DIAGNOSIS — E559 Vitamin D deficiency, unspecified: Secondary | ICD-10-CM | POA: Diagnosis not present

## 2022-03-18 DIAGNOSIS — Z6831 Body mass index (BMI) 31.0-31.9, adult: Secondary | ICD-10-CM

## 2022-03-18 NOTE — Progress Notes (Signed)
Careteam: Patient Care Team: [name], NP as PCP - General (Geriatric Medicine) Drema Dallas, DO as Consulting Physician (Neurology)  PLACE OF SERVICE:  [facility]  Advanced Directive information Does Patient Have a Medical Advance Directive?: Yes, Type of Advance Directive: Healthcare Power of [place];Living will, Does patient want to make changes to medical advance directive?: No - Patient declined  Allergies  Allergen Reactions   Mobic [Meloxicam]     GI bleed    Chief Complaint  Patient presents with   Medical Management of Chronic Issues    6 month follow-up and discuss need for covid booster or post pone if patient. Discuss if a screening CT is need due to history of smoking. Discuss right ankle, possible bone loss. Patient had a fall related to right ankle giving out.      HPI: Patient is a 71 y.o. female for routine follow up.   She is trying to lose weight. Weights first thing in the morning without clothes on and she is weighing 173.5lbs,  Making healthy choices with her food, and increase in physical activity.   Her ankle continues to cause her problems- pain 2-3.  She wears a brace.  Ongoing concern. Wants to keep moving.   Osteopenia- on cal and vit d, likes to walk  Htn- at goal.   Mood has been well controlled on zoloft.   Review of Systems:  Review of Systems  Constitutional:  Negative for chills, fever and weight loss.  HENT:  Negative for tinnitus.   Respiratory:  Negative for cough, sputum production and shortness of breath.   Cardiovascular:  Negative for chest pain, palpitations and leg swelling.  Gastrointestinal:  Negative for abdominal pain, constipation, diarrhea and heartburn.  Genitourinary:  Negative for dysuria, frequency and urgency.  Musculoskeletal:  Positive for joint pain. Negative for back pain, falls and myalgias.  Skin: Negative.   Neurological:  Negative for dizziness and headaches.  Psychiatric/Behavioral:   Negative for depression and memory loss. The patient does not have insomnia.     Past Medical History:  Diagnosis Date   Anemia    Anxiety    Arthritis    Asthma    Depression    Diverticulosis    noted on colonscopy    Elevated cholesterol    GERD (gastroesophageal reflux disease)    H/O mammogram    2015, 2016   Hepatitis    Herpes    Not gential   High blood pressure    History of esophagogastroduodenoscopy (EGD)    2014, 2016   History of PFTs 10/26/2012   Lower GI bleed 01/19/2015   Related to mobic 15 mg    Mitral valve prolapse 09/06/1996   Osteoarthritis of both knees    Pneumonia    Past Surgical History:  Procedure Laterality Date   BREAST EXCISIONAL BIOPSY Left    benign   BREAST SURGERY  05/20/2007   [facility], Dr.William Purkert    COLONOSCOPY     2014, 2016 Dr.Bajani Manalo, Dr.Mahmood Abedi    KNEE ARTHROSCOPY Right    Social History:   reports that she quit smoking about 35 years ago. Her smoking use included cigarettes. She has a 2.50 pack-year smoking history. She has never used smokeless tobacco. She reports current alcohol use. She reports that she does not use drugs.  Family History  Problem Relation Age of Onset   Heart attack Mother    CVA Mother    Diabetes Mother  Congestive Heart Failure Mother    Stroke Mother    Heart disease Mother    Hyperlipidemia Mother    Hypertension Mother    Depression Mother    Obesity Mother    Lung cancer Sister 8   Stomach cancer Brother    Neurologic Disorder Son    Colon cancer Niece    Diabetes Maternal Grandmother     Medications: Patient's Medications  New Prescriptions   No medications on file  Previous Medications   CALCIUM CARBONATE (CALCIUM 600) 600 MG TABS TABLET    Take 1 tablet (600 mg total) by mouth 2 (two) times daily with a meal.   CHOLECALCIFEROL (VITAMIN D3) 50 MCG (2000 UT) CAPSULE    Take 1 capsule (2,000 Units total) by mouth daily.   HYDROCHLOROTHIAZIDE (HYDRODIURIL) 25  MG TABLET    Take 1 tablet (25 mg total) by mouth daily.   ROSUVASTATIN (CRESTOR) 10 MG TABLET    Take 1 tablet (10 mg total) by mouth daily.   SERTRALINE (ZOLOFT) 100 MG TABLET    Take 1 tablet (100 mg total) by mouth daily.   VALACYCLOVIR (VALTREX) 500 MG TABLET    Take 1 tablet (500 mg total) by mouth daily.  Modified Medications   No medications on file  Discontinued Medications   No medications on file    Physical Exam:  Vitals:   03/18/22 1001  BP: 126/74  Pulse: 64  Temp: (!) 97.3 F (36.3 C)  SpO2: 97%  Weight: 173 lb 8 oz (78.7 kg)  Height: 5\' 3"  (1.6 m)   Body mass index is 30.73 kg/m. Wt Readings from Last 3 Encounters:  03/18/22 173 lb 8 oz (78.7 kg)  12/03/21 181 lb (82.1 kg)  10/09/21 181 lb (82.1 kg)    Physical Exam Constitutional:      General: She is not in acute distress.    Appearance: She is well-developed. She is not diaphoretic.  HENT:     Head: Normocephalic and atraumatic.     Mouth/Throat:     Pharynx: No oropharyngeal exudate.  Eyes:     Conjunctiva/sclera: Conjunctivae normal.     Pupils: Pupils are equal, round, and reactive to light.  Cardiovascular:     Rate and Rhythm: Normal rate and regular rhythm.     Heart sounds: Normal heart sounds.  Pulmonary:     Effort: Pulmonary effort is normal.     Breath sounds: Normal breath sounds.  Abdominal:     General: Bowel sounds are normal.     Palpations: Abdomen is soft.  Musculoskeletal:     Cervical back: Normal range of motion and neck supple.     Right lower leg: No edema.     Left lower leg: No edema.     Right ankle: Normal.     Left ankle: Normal.  Skin:    General: Skin is warm and dry.  Neurological:     Mental Status: She is alert and oriented to person, place, and time.     Gait: Gait normal.  Psychiatric:        Mood and Affect: Mood normal.     Labs reviewed: Basic Metabolic Panel: Recent Labs    09/17/21 1012  NA 143  K 4.0  CL 108  CO2 31  GLUCOSE 83  BUN  18  CREATININE 0.82  CALCIUM 9.2   Liver Function Tests: Recent Labs    09/17/21 1012  AST 16  ALT 11  BILITOT 0.3  PROT 6.4   No results for input(s): "LIPASE", "AMYLASE" in the last 8760 hours. No results for input(s): "AMMONIA" in the last 8760 hours. CBC: Recent Labs    09/17/21 1012 12/03/21 1544  WBC 5.9 7.2  NEUTROABS 2,938 3,722  HGB 12.2 12.4  HCT 37.6 37.7  MCV 96.7 95.0  PLT 264 261   Lipid Panel: Recent Labs    09/17/21 1012  CHOL 192  HDL 72  LDLCALC 106*  TRIG 54  CHOLHDL 2.7   TSH: No results for input(s): "TSH" in the last 8760 hours. A1C: Lab Results  Component Value Date   HGBA1C 5.6 03/12/2021     Assessment/Plan 1. Chronic pain of right ankle - AMB referral to orthopedics  2. Depression, major, single episode, complete remission ([place]) -well controlled on zoloft and lifestyle modifications   3. Vitamin D deficiency -continues on Vit D supplement  4. Mixed hyperlipidemia -continue with dietary modifications and crestor - Lipid panel; Future - COMPLETE METABOLIC PANEL WITH GFR; Future  5. Essential hypertension -Blood pressure well controlled, goal bp <140/90 Continue current medications and dietary modifications follow metabolic panel - COMPLETE METABOLIC PANEL WITH GFR; Future - CBC with Differential/Platelet; Future  6. Class 1 obesity due to excess calories without serious comorbidity with body mass index (BMI) of 31.0 to 31.9 in adult --education provided on healthy weight loss through increase in physical activity and proper nutrition    Return in about 6 months (around 09/16/2022) for routine follow up, labs prior to appt. Carlos American. [place], [facility] [phone]

## 2022-03-19 ENCOUNTER — Ambulatory Visit: Payer: 59 | Admitting: Orthopaedic Surgery

## 2022-03-19 DIAGNOSIS — M25571 Pain in right ankle and joints of right foot: Secondary | ICD-10-CM | POA: Diagnosis not present

## 2022-03-19 NOTE — Progress Notes (Signed)
Office Visit Note   Patient: Alejandra Crane           Date of Birth: [birth year]           MRN: [id number] Visit Date: 03/19/2022              Requested by: Alejandra Seller, NP [address] [place]. [place],  [place] [address] PCP: Alejandra Seller, NP   Assessment & Plan: Visit Diagnoses:  1. Pain in right ankle and joints of right foot     Plan: Impression is right posterior tibial tendon dysfunction.  She is able to perform single heel raise.  Disease process and treatment options were explained.  She would like to start with custom orthotics and arch supports.  Fortunately the symptoms are relatively mild at this point.  We will see her back as needed.  Follow-Up Instructions: No follow-ups on file.   Orders:  No orders of the defined types were placed in this encounter.  No orders of the defined types were placed in this encounter.     Procedures: No procedures performed   Clinical Data: No additional findings.   Subjective: Chief Complaint  Patient presents with   Right Ankle - Pain    HPI Alejandra Crane returns today for evaluation of a new problem of right ankle pain on the medial side.  Rates the pain 2-3 out of 10.  Currently wears compression sleeves for pain relief.  Review of Systems  Constitutional: Negative.   HENT: Negative.    Eyes: Negative.   Respiratory: Negative.    Cardiovascular: Negative.   Endocrine: Negative.   Musculoskeletal: Negative.   Neurological: Negative.   Hematological: Negative.   Psychiatric/Behavioral: Negative.    All other systems reviewed and are negative.    Objective: Vital Signs: There were no vitals taken for this visit.  Physical Exam Vitals and nursing note reviewed.  Constitutional:      Appearance: She is well-developed.  Pulmonary:     Effort: Pulmonary effort is normal.  Skin:    General: Skin is warm.     Capillary Refill: Capillary refill takes less than 2 seconds.  Neurological:     Mental Status:  She is alert and oriented to person, place, and time.  Psychiatric:        Behavior: Behavior normal.        Thought Content: Thought content normal.        Judgment: Judgment normal.     Ortho Exam Examination of the right ankle shows slight tenderness along the posterior tibial tendon.  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 Specialty Comments:  No specialty comments available.  Imaging: No results found.   PMFS History: Patient Active Problem List   Diagnosis Date Noted   Arthritis of carpometacarpal [facility]) joint of left thumb 10/09/2021   History of GI diverticular bleed 09/16/2019   Acute lower GI bleeding 08/22/2019   Orthostatic syncope 08/22/2019   Osteopenia 09/16/2018   Essential hypertension 02/19/2017   Mixed hyperlipidemia 02/19/2017   Depression, major, single episode, complete remission ([facility]) 02/19/2017   Herpes genitalis in women 02/19/2017   Past Medical History:  Diagnosis Date   Anemia    Anxiety    Arthritis    Asthma    Depression    Diverticulosis    noted on colonscopy    Elevated cholesterol    GERD (gastroesophageal reflux disease)    H/O mammogram    2015, 2016  Hepatitis    Herpes    Not gential   High blood pressure    History of esophagogastroduodenoscopy (EGD)    2014, 2016   History of PFTs 10/26/2012   Lower GI bleed 01/19/2015   Related to mobic 15 mg    Mitral valve prolapse 09/06/1996   Osteoarthritis of both knees    Pneumonia     Family History  Problem Relation Age of Onset   Heart attack Mother    CVA Mother    Diabetes Mother    Congestive Heart Failure Mother    Stroke Mother    Heart disease Mother    Hyperlipidemia Mother    Hypertension Mother    Depression Mother    Obesity Mother    Lung cancer Sister 60   Stomach cancer Brother    Neurologic Disorder Son    Colon cancer Niece    Diabetes Maternal Grandmother     Past Surgical History:  Procedure Laterality Date   BREAST  EXCISIONAL BIOPSY Left    benign   BREAST SURGERY  05/20/2007   [facility], Dr.William Purkert    COLONOSCOPY     2014, 2016 Dr.Bajani Manalo, Dr.Mahmood Abedi    KNEE ARTHROSCOPY Right    Social History   Occupational History   Occupation: Charity fundraiser  Tobacco Use   Smoking status: Former    Packs/day: 0.25    Years: 10.00    Total pack years: 2.50    Types: Cigarettes    Quit date: 09/16/1986    Years since quitting: 35.5   Smokeless tobacco: Never   Tobacco comments:    Quit in her 23's   Vaping Use   Vaping Use: Never used  Substance and Sexual Activity   Alcohol use: Yes    Comment: Occasionally red wine    Drug use: No   Sexual activity: Not Currently

## 2022-05-19 ENCOUNTER — Encounter: Payer: Self-pay | Admitting: Nurse Practitioner

## 2022-05-19 DIAGNOSIS — M25571 Pain in right ankle and joints of right foot: Secondary | ICD-10-CM

## 2022-05-20 NOTE — Telephone Encounter (Signed)
Message routed to Eubanks, Jessica K, NP to review and advise.  

## 2022-05-29 ENCOUNTER — Ambulatory Visit: Payer: Commercial Managed Care - PPO | Admitting: Orthopaedic Surgery

## 2022-05-29 ENCOUNTER — Encounter: Payer: Self-pay | Admitting: Orthopaedic Surgery

## 2022-05-29 DIAGNOSIS — M76821 Posterior tibial tendinitis, right leg: Secondary | ICD-10-CM

## 2022-05-29 NOTE — Progress Notes (Signed)
Office Visit Note   Patient: Alejandra Crane           Date of Birth: 05/17/1950           MRN: [id number] Visit Date: 05/29/2022              Requested by: Lauree Chandler, NP [place],  [place] [address] PCP: Lauree Chandler, NP   Assessment & Plan: Visit Diagnoses:  1. Posterior tibial tendinitis of right lower extremity     Plan: Impression is chronic right ankle posterior tibial tendinitis.  At this point, I recommended wearing a cam boot as well as using topical diclofenac.  I would also like to go ahead and order an MRI to assess for structural abnormalities.  She will follow-up with [place] once this is been completed.  Call with concerns or questions in the meantime.  Follow-Up Instructions: Return for f/u after MRI.   Orders:  No orders of the defined types were placed in this encounter.  No orders of the defined types were placed in this encounter.     Procedures: No procedures performed   Clinical Data: No additional findings.   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  She was diagnosed with posterior tibial tendinitis about 2 months ago.  Symptoms were minimal at that time and she was provided prescription for custom orthotic which she has been wearing.  She is also been wearing a compression sleeve and [place] shoes.  She notes that her symptoms have worsened.  The pain is worse with activity as well as at the end of the day and occasionally wakes her up at night.  She has been taking Tylenol without significant relief.  She is unable to take NSAIDs due to prior GI bleed.  She notes that her right ankle is starting to affect her quality of life and she is very worried.   Review of Systems as detailed in HPI.  All others reviewed and are negative.   Objective: Vital Signs: There were  no vitals taken for this visit.  Physical Exam well-developed well-nourished female no acute distress.  Alert and oriented x 3.  Ortho Exam right ankle exam shows mild swelling.  Mild tenderness along the posterior tibial tendon.  Increased pain with resisted inversion of the ankle.  She is neurovascularly intact distally.  Specialty Comments:  No specialty comments available.  Imaging: No new imaging   PMFS History: Patient Active Problem List   Diagnosis Date Noted   Arthritis of carpometacarpal [facility]) joint of left thumb 10/09/2021   History of GI diverticular bleed 09/16/2019   Acute lower GI bleeding 08/22/2019   Orthostatic syncope 08/22/2019   Osteopenia 09/16/2018   Essential hypertension 02/19/2017   Mixed hyperlipidemia 02/19/2017   Depression, major, single episode, complete remission ([place]) 02/19/2017   Herpes genitalis in women 02/19/2017   Past Medical History:  Diagnosis Date   Anemia    Anxiety    Arthritis    Asthma    Depression    Diverticulosis    noted on colonscopy    Elevated cholesterol    GERD (gastroesophageal reflux disease)    H/O mammogram    2015, 2016   Hepatitis    Herpes    Not gential   High blood pressure  History of esophagogastroduodenoscopy (EGD)    2014, 2016   History of PFTs 10/26/2012   Lower GI bleed 01/19/2015   Related to mobic 15 mg    Mitral valve prolapse 09/06/1996   Osteoarthritis of both knees    Pneumonia     Family History  Problem Relation Age of Onset   Heart attack Mother    CVA Mother    Diabetes Mother    Congestive Heart Failure Mother    Stroke Mother    Heart disease Mother    Hyperlipidemia Mother    Hypertension Mother    Depression Mother    Obesity Mother    Lung cancer Sister 85   Stomach cancer Brother    Neurologic Disorder Son    Colon cancer Niece    Diabetes Maternal Grandmother     Past Surgical History:  Procedure Laterality Date   BREAST EXCISIONAL BIOPSY Left    benign    BREAST SURGERY  05/20/2007   [facility], Dr.William Purkert    COLONOSCOPY     2014, 2016 Dr.Bajani Manalo, Dr.Mahmood Abedi    KNEE ARTHROSCOPY Right    Social History   Occupational History   Occupation: Therapist, sports  Tobacco Use   Smoking status: Former    Packs/day: 0.25    Years: 10.00    Total pack years: 2.50    Types: Cigarettes    Quit date: 09/16/1986    Years since quitting: 35.7   Smokeless tobacco: Never   Tobacco comments:    Quit in her 77's   Vaping Use   Vaping Use: Never used  Substance and Sexual Activity   Alcohol use: Yes    Comment: Occasionally red wine    Drug use: No   Sexual activity: Not Currently

## 2022-06-02 ENCOUNTER — Other Ambulatory Visit: Payer: Self-pay | Admitting: Nurse Practitioner

## 2022-06-02 DIAGNOSIS — A6009 Herpesviral infection of other urogenital tract: Secondary | ICD-10-CM

## 2022-06-03 ENCOUNTER — Other Ambulatory Visit: Payer: Self-pay

## 2022-06-03 ENCOUNTER — Other Ambulatory Visit (HOSPITAL_COMMUNITY): Payer: Self-pay

## 2022-06-03 MED ORDER — VALACYCLOVIR HCL 500 MG PO TABS
500.0000 mg | ORAL_TABLET | Freq: Every day | ORAL | 1 refills | Status: DC
  Filled 2022-06-03: qty 90, 90d supply, fill #0
  Filled 2022-08-29: qty 90, 90d supply, fill #1

## 2022-06-06 ENCOUNTER — Telehealth: Payer: Self-pay | Admitting: Orthopaedic Surgery

## 2022-06-06 ENCOUNTER — Other Ambulatory Visit: Payer: Self-pay

## 2022-06-06 DIAGNOSIS — M76821 Posterior tibial tendinitis, right leg: Secondary | ICD-10-CM

## 2022-06-06 NOTE — Telephone Encounter (Signed)
Notified patient that it has been ordered.

## 2022-06-06 NOTE — Telephone Encounter (Signed)
Patient waiting on MRI appt. Please advise.

## 2022-06-06 NOTE — Telephone Encounter (Signed)
Yes pelase

## 2022-06-10 DIAGNOSIS — M25571 Pain in right ankle and joints of right foot: Secondary | ICD-10-CM | POA: Diagnosis not present

## 2022-06-26 ENCOUNTER — Ambulatory Visit: Payer: Commercial Managed Care - PPO | Admitting: Orthopaedic Surgery

## 2022-06-26 DIAGNOSIS — M76821 Posterior tibial tendinitis, right leg: Secondary | ICD-10-CM

## 2022-06-26 NOTE — Progress Notes (Signed)
Office Visit Note   Patient: Alejandra Crane           Date of Birth: [birth year]           MRN: [id number] Visit Date: 06/26/2022              Requested by: Lauree Chandler, NP [place],  [place] [address] PCP: Lauree Chandler, NP   Assessment & Plan: Visit Diagnoses:  1. Posterior tibial tendinitis of right lower extremity     Plan: MRI shows several areas of degenerative but also partial tear of the posterior tibial tendon which seems to correlate best with her symptoms.  Based on these findings I recommended referral to Dr. Sharol Given to discuss treatment options.  Follow-Up Instructions: No follow-ups on file.   Orders:  No orders of the defined types were placed in this encounter.  No orders of the defined types were placed in this encounter.     Procedures: No procedures performed   Clinical Data: No additional findings.   Subjective: Chief Complaint  Patient presents with   Right Ankle - Pain    HPI  Alejandra Crane returns today to discuss MRI scan.  She has continued to wear the boot for comfort.  Review of Systems   Objective: Vital Signs: There were no vitals taken for this visit.  Physical Exam  Ortho Exam  Examination of the right foot and ankle is unchanged.  Specialty Comments:  No specialty comments available.  Imaging: No results found.   PMFS History: Patient Active Problem List   Diagnosis Date Noted   Arthritis of carpometacarpal [facility]) joint of left thumb 10/09/2021   History of GI diverticular bleed 09/16/2019   Acute lower GI bleeding 08/22/2019   Orthostatic syncope 08/22/2019   Osteopenia 09/16/2018   Essential hypertension 02/19/2017   Mixed hyperlipidemia 02/19/2017   Depression, major, single episode, complete remission ([place]) 02/19/2017   Herpes genitalis in women 02/19/2017   Past Medical History:  Diagnosis Date   Anemia    Anxiety    Arthritis    Asthma    Depression    Diverticulosis    noted  on colonscopy    Elevated cholesterol    GERD (gastroesophageal reflux disease)    H/O mammogram    2015, 2016   Hepatitis    Herpes    Not gential   High blood pressure    History of esophagogastroduodenoscopy (EGD)    2014, 2016   History of PFTs 10/26/2012   Lower GI bleed 01/19/2015   Related to mobic 15 mg    Mitral valve prolapse 09/06/1996   Osteoarthritis of both knees    Pneumonia     Family History  Problem Relation Age of Onset   Heart attack Mother    CVA Mother    Diabetes Mother    Congestive Heart Failure Mother    Stroke Mother    Heart disease Mother    Hyperlipidemia Mother    Hypertension Mother    Depression Mother    Obesity Mother    Lung cancer Sister 35   Stomach cancer Brother    Neurologic Disorder Son    Colon cancer Niece    Diabetes Maternal Grandmother     Past Surgical History:  Procedure Laterality Date   BREAST EXCISIONAL BIOPSY Left    benign   BREAST SURGERY  05/20/2007   [facility], Dr.William Purkert    COLONOSCOPY     2014, 2016 Dr.Bajani Manalo,  Dr.Mahmood Abedi    KNEE ARTHROSCOPY Right    Social History   Occupational History   Occupation: Therapist, sports  Tobacco Use   Smoking status: Former    Packs/day: 0.25    Years: 10.00    Total pack years: 2.50    Types: Cigarettes    Quit date: 09/16/1986    Years since quitting: 35.8   Smokeless tobacco: Never   Tobacco comments:    Quit in her 58's   Vaping Use   Vaping Use: Never used  Substance and Sexual Activity   Alcohol use: Yes    Comment: Occasionally red wine    Drug use: No   Sexual activity: Not Currently

## 2022-07-02 ENCOUNTER — Ambulatory Visit: Payer: Commercial Managed Care - PPO | Admitting: Orthopedic Surgery

## 2022-07-02 DIAGNOSIS — M76821 Posterior tibial tendinitis, right leg: Secondary | ICD-10-CM

## 2022-07-09 ENCOUNTER — Encounter: Payer: Self-pay | Admitting: Orthopedic Surgery

## 2022-07-09 ENCOUNTER — Ambulatory Visit: Payer: Commercial Managed Care - PPO | Admitting: Orthopedic Surgery

## 2022-07-09 NOTE — Progress Notes (Signed)
Office Visit Note   Patient: Alejandra Crane           Date of Birth: [birth year]           MRN: [id number] Visit Date: 07/02/2022              Requested by: Lauree Chandler, NP [place],  [place] [address] PCP: Lauree Chandler, NP  Chief Complaint  Patient presents with   Right Ankle - Pain      HPI: Patient is a 72 year old woman who is seen for initial evaluation and referral from Dr. 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  Assessment & Plan: Visit Diagnoses:  1. Posterior tibial tendinitis of right lower extremity     Plan: With patient's posterior tibial tendon insufficiency and insufficient relief with conservative treatment patient states she would like to proceed with subtalar and talonavicular fusion.  Risk and benefits were discussed postoperative course was discussed patient states she understands and wishes to proceed at this time.  She states she is traveling to [place] in September.  She would like to have the surgery performed in March.  Follow-Up Instructions: Return if symptoms worsen or fail to improve.   Ortho Exam  Patient is alert, oriented, no adenopathy, well-dressed, normal affect, normal respiratory effort. Examination patient has a good dorsalis pedis and posterior tibial pulse she has a pronated valgus foot she cannot do a single limb heel raise she is not tender to palpation the sinus Tarsi she has tenderness to palpation along the posterior tibial tendon.  MRI scan does show tearing of the posterior tibial tendon.  Patient has swelling along the course of the posterior tibial tendon.  Imaging: No results found. No images are attached to the encounter.  Labs: Lab Results  Component Value Date   HGBA1C 5.6 03/12/2021   HGBA1C 5.8 (H) 08/02/2020   HGBA1C 5.9 (H) 01/19/2020    ESRSEDRATE 29 05/19/2019   LABURIC 4.3 12/03/2021     Lab Results  Component Value Date   ALBUMIN 4.1 08/02/2020   ALBUMIN 4.0 01/19/2020   ALBUMIN 2.5 (L) 08/23/2019    Lab Results  Component Value Date   MG 1.9 08/23/2019   Lab Results  Component Value Date   VD25OH 37 03/12/2021   VD25OH 90.3 08/02/2020   VD25OH 28.1 (L) 01/19/2020    No results found for: "PREALBUMIN"    Latest Ref Rng & Units 12/03/2021    3:44 PM 09/17/2021   10:12 AM 03/12/2021   10:49 AM  CBC EXTENDED  WBC 3.8 - 10.8 Thousand/uL 7.2  5.9  6.4   RBC 3.80 - 5.10 Million/uL 3.97  3.89  3.89   Hemoglobin 11.7 - 15.5 g/dL 12.4  12.2  11.8   HCT 35.0 - 45.0 % 37.7  37.6  37.5   Platelets 140 - 400 Thousand/uL 261  264  268   NEUT# 1,500 - 7,800 cells/uL 3,722  2,938  3,302   Lymph# 850 - 3,900 cells/uL 2,714  2,407  2,502      There is no height or weight on file to calculate BMI.  Orders:  No orders of the defined types were placed in this encounter.  No orders of the defined types were placed in this encounter.    Procedures: No procedures performed  Clinical Data: No additional  findings.  ROS:  All other systems negative, except as noted in the HPI. Review of Systems  Objective: Vital Signs: There were no vitals taken for this visit.  Specialty Comments:  No specialty comments available.  PMFS History: Patient Active Problem List   Diagnosis Date Noted   Arthritis of carpometacarpal [facility]) joint of left thumb 10/09/2021   History of GI diverticular bleed 09/16/2019   Acute lower GI bleeding 08/22/2019   Orthostatic syncope 08/22/2019   Osteopenia 09/16/2018   Essential hypertension 02/19/2017   Mixed hyperlipidemia 02/19/2017   Depression, major, single episode, complete remission ([place]) 02/19/2017   Herpes genitalis in women 02/19/2017   Past Medical History:  Diagnosis Date   Anemia    Anxiety    Arthritis    Asthma    Depression    Diverticulosis    noted on  colonscopy    Elevated cholesterol    GERD (gastroesophageal reflux disease)    H/O mammogram    2015, 2016   Hepatitis    Herpes    Not gential   High blood pressure    History of esophagogastroduodenoscopy (EGD)    2014, 2016   History of PFTs 10/26/2012   Lower GI bleed 01/19/2015   Related to mobic 15 mg    Mitral valve prolapse 09/06/1996   Osteoarthritis of both knees    Pneumonia     Family History  Problem Relation Age of Onset   Heart attack Mother    CVA Mother    Diabetes Mother    Congestive Heart Failure Mother    Stroke Mother    Heart disease Mother    Hyperlipidemia Mother    Hypertension Mother    Depression Mother    Obesity Mother    Lung cancer Sister 36   Stomach cancer Brother    Neurologic Disorder Son    Colon cancer Niece    Diabetes Maternal Grandmother     Past Surgical History:  Procedure Laterality Date   BREAST EXCISIONAL BIOPSY Left    benign   BREAST SURGERY  05/20/2007   [facility], Dr.William Purkert    COLONOSCOPY     2014, 2016 Dr.Bajani Manalo, Dr.Mahmood Abedi    KNEE ARTHROSCOPY Right    Social History   Occupational History   Occupation: Therapist, sports  Tobacco Use   Smoking status: Former    Packs/day: 0.25    Years: 10.00    Total pack years: 2.50    Types: Cigarettes    Quit date: 09/16/1986    Years since quitting: 35.8   Smokeless tobacco: Never   Tobacco comments:    Quit in her 15's   Vaping Use   Vaping Use: Never used  Substance and Sexual Activity   Alcohol use: Yes    Comment: Occasionally red wine    Drug use: No   Sexual activity: Not Currently

## 2022-07-31 ENCOUNTER — Telehealth: Payer: Self-pay | Admitting: Orthopedic Surgery

## 2022-07-31 NOTE — Telephone Encounter (Signed)
Left message on patient's voicemail providing name and direct telephone number for scheduling surgery,

## 2022-07-31 NOTE — Telephone Encounter (Signed)
This pt was in the office 07/02/2022 discuss subtalar and talonavicular fusion. Is there a blue sheet for this pt? I not I will have Erin to fill out and then can some one call to schedule her please?

## 2022-07-31 NOTE — Telephone Encounter (Signed)
Patient states she is waiting on a surgery date..please advise

## 2022-08-05 NOTE — Progress Notes (Addendum)
SDW call  Spoke with patient 08/05/2022. States she has been informed that she has a $7,000 co-pay. States she cannot afford this and is canceling surgery. She has called Dr. Jess Barters office.  She will get on disability and see about re-scheduling once Medicare is in place.   PCP - Sherrie Mustache, NP Cardiologist - Denies Pulmonary: Denies   PPM/ICD -    Chest x-ray - n/a EKG -  DOS, 08/07/2022 Stress Test - n/a ECHO - n/a Cardiac Cath - n/a   Sleep Study/sleep apnea/CPAP  Non-diabetic   Blood Thinner Instructions: Denies Aspirin Instructions:Denies   ERAS Protcol -  PRE-SURGERY Ensure or G2-    COVID TEST- n/a     Anesthesia review: No   Patient denies shortness of breath, fever, cough and chest pain over the phone call

## 2022-08-05 NOTE — Telephone Encounter (Signed)
Debbie to call patient and discuss scheduling surgery. 08/02/22.

## 2022-08-06 NOTE — Progress Notes (Signed)
Patient is cancelling her 08/07/22 surgical procedure with Dr Sharol Given.  Informed patient to call MD's office to let them know of her desire to cancel.  Patient states she will reschedule at a later date.

## 2022-08-07 ENCOUNTER — Ambulatory Visit (HOSPITAL_COMMUNITY)
Admission: RE | Admit: 2022-08-07 | Payer: Commercial Managed Care - PPO | Source: Ambulatory Visit | Admitting: Orthopedic Surgery

## 2022-08-07 SURGERY — FUSION, JOINT, FOOT
Anesthesia: Choice | Site: Foot | Laterality: Right

## 2022-08-21 ENCOUNTER — Encounter: Payer: Commercial Managed Care - PPO | Admitting: Family

## 2022-08-29 ENCOUNTER — Other Ambulatory Visit: Payer: Self-pay | Admitting: Nurse Practitioner

## 2022-08-29 DIAGNOSIS — I10 Essential (primary) hypertension: Secondary | ICD-10-CM

## 2022-08-29 DIAGNOSIS — F325 Major depressive disorder, single episode, in full remission: Secondary | ICD-10-CM

## 2022-08-30 ENCOUNTER — Other Ambulatory Visit (HOSPITAL_COMMUNITY): Payer: Self-pay

## 2022-08-30 ENCOUNTER — Other Ambulatory Visit: Payer: Self-pay

## 2022-08-30 MED ORDER — SERTRALINE HCL 100 MG PO TABS
100.0000 mg | ORAL_TABLET | Freq: Every day | ORAL | 1 refills | Status: DC
  Filled 2022-08-30: qty 90, 90d supply, fill #0
  Filled 2022-12-06: qty 90, 90d supply, fill #1

## 2022-08-30 MED ORDER — ROSUVASTATIN CALCIUM 10 MG PO TABS
10.0000 mg | ORAL_TABLET | Freq: Every day | ORAL | 3 refills | Status: DC
  Filled 2022-08-30: qty 90, 90d supply, fill #0
  Filled 2022-12-06: qty 90, 90d supply, fill #1

## 2022-08-30 MED ORDER — HYDROCHLOROTHIAZIDE 25 MG PO TABS
25.0000 mg | ORAL_TABLET | Freq: Every day | ORAL | 1 refills | Status: DC
  Filled 2022-08-30: qty 90, 90d supply, fill #0
  Filled 2022-12-06: qty 90, 90d supply, fill #1

## 2022-09-12 ENCOUNTER — Other Ambulatory Visit: Payer: Medicare Other

## 2022-09-12 DIAGNOSIS — E782 Mixed hyperlipidemia: Secondary | ICD-10-CM | POA: Diagnosis not present

## 2022-09-12 DIAGNOSIS — E559 Vitamin D deficiency, unspecified: Secondary | ICD-10-CM | POA: Diagnosis not present

## 2022-09-12 DIAGNOSIS — I10 Essential (primary) hypertension: Secondary | ICD-10-CM

## 2022-09-16 ENCOUNTER — Encounter: Payer: Self-pay | Admitting: Nurse Practitioner

## 2022-09-16 ENCOUNTER — Ambulatory Visit (INDEPENDENT_AMBULATORY_CARE_PROVIDER_SITE_OTHER): Payer: Commercial Managed Care - PPO | Admitting: Nurse Practitioner

## 2022-09-16 VITALS — BP 132/86 | HR 66 | Temp 97.3°F | Ht 63.0 in | Wt 182.6 lb

## 2022-09-16 DIAGNOSIS — D649 Anemia, unspecified: Secondary | ICD-10-CM

## 2022-09-16 DIAGNOSIS — Z6831 Body mass index (BMI) 31.0-31.9, adult: Secondary | ICD-10-CM

## 2022-09-16 DIAGNOSIS — E782 Mixed hyperlipidemia: Secondary | ICD-10-CM | POA: Diagnosis not present

## 2022-09-16 DIAGNOSIS — M159 Polyosteoarthritis, unspecified: Secondary | ICD-10-CM | POA: Diagnosis not present

## 2022-09-16 DIAGNOSIS — E6609 Other obesity due to excess calories: Secondary | ICD-10-CM | POA: Diagnosis not present

## 2022-09-16 DIAGNOSIS — F325 Major depressive disorder, single episode, in full remission: Secondary | ICD-10-CM | POA: Diagnosis not present

## 2022-09-16 DIAGNOSIS — I10 Essential (primary) hypertension: Secondary | ICD-10-CM | POA: Diagnosis not present

## 2022-09-16 DIAGNOSIS — M19042 Primary osteoarthritis, left hand: Secondary | ICD-10-CM | POA: Insufficient documentation

## 2022-09-16 DIAGNOSIS — E559 Vitamin D deficiency, unspecified: Secondary | ICD-10-CM | POA: Diagnosis not present

## 2022-09-16 NOTE — Progress Notes (Signed)
Careteam: Patient Care Team: [name], NP as PCP - General (Geriatric Medicine) Drema Dallas, DO as Consulting Physician (Neurology)  PLACE OF SERVICE:  [facility]  Advanced Directive information Does Patient Have a Medical Advance Directive?: Yes, Type of Advance Directive: Living will;Healthcare Power of Attorney, Does patient want to make changes to medical advance directive?: No - Patient declined  Allergies  Allergen Reactions   Mobic [Meloxicam]     GI bleed    Chief Complaint  Patient presents with   Medical Management of Chronic Issues    6 month follow-up and discuss labs. Discuss need for covid boosters. AWV pending for 10/10/22     HPI: Patient is a 72 y.o. female for routine follow up.   She has gained weight and can not get it off.  She has surgery coming up on her right ankle. Needs a fusion. She had to wait on insurance. Dr Lajoyce Corners doing her surgery. Not sure when she will have this.    Would like referral to weight and wellness and exercise program  Feels like she has achy joints. Stopped taking crestor daily, she is taking 3 times a week.   Going to [place] in the fall, will get to get all vaccines related to traveling through health department.   She is turning 72 this year and retiring.    Review of Systems:  Review of Systems  Constitutional:  Negative for chills, fever and weight loss.  HENT:  Negative for tinnitus.   Respiratory:  Negative for cough, sputum production and shortness of breath.   Cardiovascular:  Negative for chest pain, palpitations and leg swelling.  Gastrointestinal:  Negative for abdominal pain, constipation, diarrhea and heartburn.  Genitourinary:  Negative for dysuria, frequency and urgency.  Musculoskeletal:  Positive for joint pain. Negative for back pain, falls and myalgias.  Skin: Negative.   Neurological:  Negative for dizziness and headaches.  Psychiatric/Behavioral:  Negative for depression and memory loss.  The patient does not have insomnia.     Past Medical History:  Diagnosis Date   Anemia    Anxiety    Arthritis    Asthma    Depression    Diverticulosis    noted on colonscopy    Elevated cholesterol    GERD (gastroesophageal reflux disease)    H/O mammogram    2015, 2016   Hepatitis    Herpes    Not gential   High blood pressure    History of esophagogastroduodenoscopy (EGD)    2014, 2016   History of PFTs 10/26/2012   Lower GI bleed 01/19/2015   Related to mobic 15 mg    Mitral valve prolapse 09/06/1996   Osteoarthritis of both knees    Pneumonia    Past Surgical History:  Procedure Laterality Date   BREAST EXCISIONAL BIOPSY Left    benign   BREAST SURGERY  05/20/2007   [facility], Dr.William Purkert    COLONOSCOPY     2014, 2016 Dr.Bajani Manalo, Dr.Mahmood Abedi    KNEE ARTHROSCOPY Right    Social History:   reports that she quit smoking about 36 years ago. Her smoking use included cigarettes. She has a 2.50 pack-year smoking history. She has never used smokeless tobacco. She reports current alcohol use. She reports that she does not use drugs.  Family History  Problem Relation Age of Onset   Heart attack Mother    CVA Mother    Diabetes Mother    Congestive Heart  Failure Mother    Stroke Mother    Heart disease Mother    Hyperlipidemia Mother    Hypertension Mother    Depression Mother    Obesity Mother    Lung cancer Sister 22   Stomach cancer Brother    Neurologic Disorder Son    Colon cancer Niece    Diabetes Maternal Grandmother     Medications: Patient's Medications  New Prescriptions   No medications on file  Previous Medications   CALCIUM CARBONATE (CALCIUM 600) 600 MG TABS TABLET    Take 1 tablet (600 mg total) by mouth 2 (two) times daily with a meal.   CHOLECALCIFEROL (VITAMIN D3) 50 MCG (2000 UT) CAPSULE    Take 1 capsule (2,000 Units total) by mouth daily.   HYDROCHLOROTHIAZIDE (HYDRODIURIL) 25 MG TABLET    Take 1 tablet (25 mg total)  by mouth daily.   ROSUVASTATIN (CRESTOR) 10 MG TABLET    Take 1 tablet (10 mg total) by mouth daily.   SERTRALINE (ZOLOFT) 100 MG TABLET    Take 1 tablet (100 mg total) by mouth daily.   VALACYCLOVIR (VALTREX) 500 MG TABLET    Take 1 tablet (500 mg total) by mouth daily.  Modified Medications   No medications on file  Discontinued Medications   No medications on file    Physical Exam:  Vitals:   09/16/22 0944  BP: 132/86  Pulse: 66  Temp: (!) 97.3 F (36.3 C)  TempSrc: Temporal  SpO2: 98%  Weight: 182 lb 9.6 oz (82.8 kg)  Height: 5\' 3"  (1.6 m)   Body mass index is 32.35 kg/m. Wt Readings from Last 3 Encounters:  09/16/22 182 lb 9.6 oz (82.8 kg)  03/18/22 173 lb 8 oz (78.7 kg)  12/03/21 181 lb (82.1 kg)    Physical Exam Constitutional:      General: She is not in acute distress.    Appearance: She is well-developed. She is not diaphoretic.  HENT:     Head: Normocephalic and atraumatic.     Mouth/Throat:     Pharynx: No oropharyngeal exudate.  Eyes:     Conjunctiva/sclera: Conjunctivae normal.     Pupils: Pupils are equal, round, and reactive to light.  Cardiovascular:     Rate and Rhythm: Normal rate and regular rhythm.     Heart sounds: Normal heart sounds.  Pulmonary:     Effort: Pulmonary effort is normal.     Breath sounds: Normal breath sounds.  Abdominal:     General: Bowel sounds are normal.     Palpations: Abdomen is soft.  Musculoskeletal:     Cervical back: Normal range of motion and neck supple.     Right lower leg: No edema.     Left lower leg: No edema.  Skin:    General: Skin is warm and dry.  Neurological:     Mental Status: She is alert.  Psychiatric:        Mood and Affect: Mood normal.    Labs reviewed: Basic Metabolic Panel: Recent Labs    09/17/21 1012 09/12/22 0943  NA 143 142  K 4.0 4.0  CL 108 106  CO2 31 26  GLUCOSE 83 86  BUN 18 28*  CREATININE 0.82 0.87  CALCIUM 9.2 9.8   Liver Function Tests: Recent Labs     09/17/21 1012 09/12/22 0943  AST 16 17  ALT 11 9  BILITOT 0.3 0.5  PROT 6.4 7.0   No results for input(s): "LIPASE", "AMYLASE"  in the last 8760 hours. No results for input(s): "AMMONIA" in the last 8760 hours. CBC: Recent Labs    09/17/21 1012 12/03/21 1544 09/12/22 0943  WBC 5.9 7.2 6.3  NEUTROABS 2,938 3,722 3,219  HGB 12.2 12.4 12.6  HCT 37.6 37.7 38.4  MCV 96.7 95.0 94.8  PLT 264 261 266   Lipid Panel: Recent Labs    09/17/21 1012 09/12/22 0943  CHOL 192 215*  HDL 72 71  LDLCALC 106* 130*  TRIG 54 53  CHOLHDL 2.7 3.0   TSH: No results for input(s): "TSH" in the last 8760 hours. A1C: Lab Results  Component Value Date   HGBA1C 5.6 03/12/2021     Assessment/Plan 1. Class 1 obesity due to excess calories without serious comorbidity with body mass index (BMI) of 31.0 to 31.9 in adult -having a hard time losing weight, will refer to weight management program and exercise  - Amb Referral To Provider Referral Exercise Program (P.R.E.P) - Amb Ref to Medical Weight Management  2. Primary osteoarthritis involving multiple joints -ongoing, following with ortho, plan to have surgery on ankle  -weight loss and increase in mobility should also help with pain related to OA  3. Vitamin D deficiency -continues on supplement  -add on vit d level to labs  4. Mixed hyperlipidemia -continues on crestor 10 mg but does not take daily, taking three times weekly. Will have her continue medication and work on diet and exercise modifications.   5. Essential hypertension -Blood pressure well controlled, goal bp <140/90 Continue current medications and dietary modifications follow metabolic panel - EKG 12-Lead- sinus brady  6. Anemia, unspecified type Improved on recent lab  7. Depression, major, single episode, complete remission (HCC) -continues on zoloft, encouraged lifestyle modifications as well.   Return in about 6 months (around 03/19/2023) for routine follow up  .  Janene Harvey. Biagio Borg [facility] [phone]

## 2022-09-18 LAB — COMPLETE METABOLIC PANEL WITH GFR
AG Ratio: 1.3 (calc) (ref 1.0–2.5)
ALT: 9 U/L (ref 6–29)
AST: 17 U/L (ref 10–35)
Albumin: 3.9 g/dL (ref 3.6–5.1)
Alkaline phosphatase (APISO): 56 U/L (ref 37–153)
BUN/Creatinine Ratio: 32 (calc) — ABNORMAL HIGH (ref 6–22)
BUN: 28 mg/dL — ABNORMAL HIGH (ref 7–25)
CO2: 26 mmol/L (ref 20–32)
Calcium: 9.8 mg/dL (ref 8.6–10.4)
Chloride: 106 mmol/L (ref 98–110)
Creat: 0.87 mg/dL (ref 0.60–1.00)
Globulin: 3.1 g/dL (calc) (ref 1.9–3.7)
Glucose, Bld: 86 mg/dL (ref 65–99)
Potassium: 4 mmol/L (ref 3.5–5.3)
Sodium: 142 mmol/L (ref 135–146)
Total Bilirubin: 0.5 mg/dL (ref 0.2–1.2)
Total Protein: 7 g/dL (ref 6.1–8.1)
eGFR: 72 mL/min/{1.73_m2} (ref >=60)

## 2022-09-18 LAB — CBC WITH DIFFERENTIAL/PLATELET
Absolute Monocytes: 491 cells/uL (ref 200–950)
Basophils Absolute: 38 cells/uL (ref 0–200)
Basophils Relative: 0.6 %
Eosinophils Absolute: 88 cells/uL (ref 15–500)
Eosinophils Relative: 1.4 %
HCT: 38.4 % (ref 35.0–45.0)
Hemoglobin: 12.6 g/dL (ref 11.7–15.5)
Lymphs Abs: 2463 cells/uL (ref 850–3900)
MCH: 31.1 pg (ref 27.0–33.0)
MCHC: 32.8 g/dL (ref 32.0–36.0)
MCV: 94.8 fL (ref 80.0–100.0)
MPV: 10.5 fL (ref 7.5–12.5)
Monocytes Relative: 7.8 %
Neutro Abs: 3219 cells/uL (ref 1500–7800)
Neutrophils Relative %: 51.1 %
Platelets: 266 10*3/uL (ref 140–400)
RBC: 4.05 10*6/uL (ref 3.80–5.10)
RDW: 13.1 % (ref 11.0–15.0)
Total Lymphocyte: 39.1 %
WBC: 6.3 10*3/uL (ref 3.8–10.8)

## 2022-09-18 LAB — LIPID PANEL
Cholesterol: 215 mg/dL — ABNORMAL HIGH (ref <200)
HDL: 71 mg/dL (ref >=50)
LDL Cholesterol (Calc): 130 mg/dL (calc) — ABNORMAL HIGH
Non-HDL Cholesterol (Calc): 144 mg/dL (calc) — ABNORMAL HIGH (ref <130)
Total CHOL/HDL Ratio: 3 (calc) (ref <5.0)
Triglycerides: 53 mg/dL (ref <150)

## 2022-09-18 LAB — TEST AUTHORIZATION 2

## 2022-09-18 LAB — VITAMIN D 25 HYDROXY (VIT D DEFICIENCY, FRACTURES): Vit D, 25-Hydroxy: 42 ng/mL (ref 30–100)

## 2022-10-08 ENCOUNTER — Encounter (HOSPITAL_COMMUNITY): Payer: Self-pay | Admitting: Orthopedic Surgery

## 2022-10-08 ENCOUNTER — Other Ambulatory Visit: Payer: Self-pay

## 2022-10-08 NOTE — Progress Notes (Addendum)
SDW CALL  Patient was given pre-op instructions over the phone. The opportunity was given for the patient to ask questions. No further questions asked. Patient verbalized understanding of instructions given.   PCP - Abbey Chatters NP Cardiologist - denies  PPM/ICD - denies Device Orders -  Rep Notified -   Chest x-ray - none EKG - 09/16/22 Stress Test - none ECHO - none Cardiac Cath - none  Sleep Study - na CPAP - no  Fasting Blood Sugar - na Checks Blood Sugar _____ times a day  Blood Thinner Instructions: na Aspirin Instructions:na  ERAS Protcol -clears until 0430 PRE-SURGERY Ensure or G2- no  COVID TEST- na   Anesthesia review:   Patient denies shortness of breath, fever, cough and chest pain over the phone call    Surgical Instructions    Your procedure is scheduled on 6/5  Report to [facility] Main Entrance "A" at 6:00 A.M., then check in with the Admitting office.  Call this number if you have problems the morning of surgery:  [phone]    Remember:  Do not eat after midnight the night before your surgery  You may drink clear liquids until 5:30am the morning of your surgery.   Clear liquids allowed are: Water, Non-Citrus Juices (without pulp), Carbonated Beverages, Clear Tea, Black Coffee ONLY (NO MILK, CREAM OR POWDERED CREAMER of any kind), and Gatorade   Take these medicines the morning of surgery with A SIP OF WATER: none    As of today, STOP taking any Aspirin (unless otherwise instructed by your surgeon) Aleve, Naproxen, Ibuprofen, Motrin, Advil, Goody's, BC's, all herbal medications, fish oil, and all vitamins.  [facility] is not responsible for any belongings or valuables. .   Do NOT Smoke (Tobacco/Vaping)  24 hours prior to your procedure  If you use a CPAP at night, you may bring your mask for your overnight stay.   Contacts, glasses, hearing aids, dentures or partials may not be worn into surgery, please bring cases for these  belongings   Patients discharged the day of surgery will not be allowed to drive home, and someone needs to stay with them for 24 hours.   SURGICAL WAITING ROOM VISITATION You may have 1 visitor in the pre-op area at a time determined by the pre-op nurse. (Visitor may not switch out) Patients having surgery or a procedure in a hospital may have two support people in the waiting room. Children under the age of 68 must have an adult with them who is not the patient. They may stay in the waiting area during the procedure and may switch out with other visitors. If the patient needs to stay at the hospital during part of their recovery, the visitor guidelines for inpatient rooms apply.  Please refer to the [facility] website for the visitor guidelines for Inpatients (after your surgery is over and you are in a regular room).     Special instructions:    Oral Hygiene is also important to reduce your risk of infection.  Remember - BRUSH YOUR TEETH THE MORNING OF SURGERY WITH YOUR REGULAR TOOTHPASTE   Day of Surgery:  Take a shower the day of or night before with antibacterial soap. Wear Clean/Comfortable clothing the morning of surgery Do not apply any deodorants/lotions.   Do not wear jewelry or makeup Do not wear lotions, powders, perfumes/colognes, or deodorant. Do not shave 48 hours prior to surgery.  Men may shave face and neck. Do not bring valuables to the  hospital. Do not wear nail polish, gel polish, artificial nails, or any other type of covering on natural nails (fingers and toes) If you have artificial nails or gel coating that need to be removed by a nail salon, please have this removed prior to surgery. Artificial nails or gel coating may interfere with anesthesia's ability to adequately monitor your vital signs. Remember to brush your teeth WITH YOUR REGULAR TOOTHPASTE.

## 2022-10-09 ENCOUNTER — Other Ambulatory Visit: Payer: Self-pay

## 2022-10-09 ENCOUNTER — Ambulatory Visit (HOSPITAL_COMMUNITY): Payer: Commercial Managed Care - PPO | Admitting: Registered Nurse

## 2022-10-09 ENCOUNTER — Encounter (HOSPITAL_COMMUNITY): Payer: Self-pay | Admitting: Orthopedic Surgery

## 2022-10-09 ENCOUNTER — Other Ambulatory Visit (HOSPITAL_COMMUNITY): Payer: Self-pay

## 2022-10-09 ENCOUNTER — Telehealth: Payer: Self-pay | Admitting: Orthopedic Surgery

## 2022-10-09 ENCOUNTER — Ambulatory Visit (HOSPITAL_COMMUNITY): Payer: Commercial Managed Care - PPO

## 2022-10-09 ENCOUNTER — Ambulatory Visit (HOSPITAL_BASED_OUTPATIENT_CLINIC_OR_DEPARTMENT_OTHER): Payer: Commercial Managed Care - PPO | Admitting: Registered Nurse

## 2022-10-09 ENCOUNTER — Encounter (HOSPITAL_COMMUNITY)
Admission: RE | Disposition: A | Discharge status: Home / Self Care | Payer: Self-pay | Source: Home / Self Care | Attending: Orthopedic Surgery

## 2022-10-09 ENCOUNTER — Ambulatory Visit (HOSPITAL_COMMUNITY)
Admission: RE | Admit: 2022-10-09 | Discharge: 2022-10-09 | Disposition: A | Discharge status: Home / Self Care | Payer: Commercial Managed Care - PPO | Attending: Orthopedic Surgery | Admitting: Orthopedic Surgery

## 2022-10-09 DIAGNOSIS — I10 Essential (primary) hypertension: Secondary | ICD-10-CM | POA: Insufficient documentation

## 2022-10-09 DIAGNOSIS — J45909 Unspecified asthma, uncomplicated: Secondary | ICD-10-CM | POA: Insufficient documentation

## 2022-10-09 DIAGNOSIS — Z79899 Other long term (current) drug therapy: Secondary | ICD-10-CM | POA: Diagnosis not present

## 2022-10-09 DIAGNOSIS — Z87891 Personal history of nicotine dependence: Secondary | ICD-10-CM | POA: Insufficient documentation

## 2022-10-09 DIAGNOSIS — F32A Depression, unspecified: Secondary | ICD-10-CM | POA: Diagnosis not present

## 2022-10-09 DIAGNOSIS — M76821 Posterior tibial tendinitis, right leg: Secondary | ICD-10-CM

## 2022-10-09 DIAGNOSIS — F419 Anxiety disorder, unspecified: Secondary | ICD-10-CM | POA: Insufficient documentation

## 2022-10-09 DIAGNOSIS — F418 Other specified anxiety disorders: Secondary | ICD-10-CM | POA: Diagnosis not present

## 2022-10-09 DIAGNOSIS — M1990 Unspecified osteoarthritis, unspecified site: Secondary | ICD-10-CM | POA: Insufficient documentation

## 2022-10-09 HISTORY — PX: FOOT ARTHRODESIS: SHX1655

## 2022-10-09 SURGERY — FUSION, JOINT, FOOT
Anesthesia: Monitor Anesthesia Care | Site: Ankle | Laterality: Right

## 2022-10-09 MED ORDER — ORAL CARE MOUTH RINSE: 15.0000 mL | Freq: Once | OROMUCOSAL | Status: AC

## 2022-10-09 MED ORDER — FENTANYL CITRATE (PF) 250 MCG/5ML IJ SOLN
INTRAMUSCULAR | Status: AC
  Filled 2022-10-09: qty 5

## 2022-10-09 MED ORDER — MIDAZOLAM HCL 2 MG/2ML IJ SOLN
INTRAMUSCULAR | Status: DC | PRN
  Administered 2022-10-09 (×2): 1 mg via INTRAVENOUS

## 2022-10-09 MED ORDER — FENTANYL CITRATE (PF) 100 MCG/2ML IJ SOLN: 25.0000 ug | INTRAMUSCULAR | Status: DC | PRN

## 2022-10-09 MED ORDER — CHLORHEXIDINE GLUCONATE 0.12 % MT SOLN
15.0000 mL | Freq: Once | OROMUCOSAL | Status: AC
  Administered 2022-10-09: 15 mL via OROMUCOSAL
  Filled 2022-10-09: qty 15

## 2022-10-09 MED ORDER — LACTATED RINGERS IV SOLN: INTRAVENOUS | Status: DC

## 2022-10-09 MED ORDER — TRAMADOL HCL 50 MG PO TABS
50.0000 mg | ORAL_TABLET | Freq: Four times a day (QID) | ORAL | 0 refills | Status: DC | PRN
  Filled 2022-10-09: qty 30, 8d supply, fill #0

## 2022-10-09 MED ORDER — MIDAZOLAM HCL 2 MG/2ML IJ SOLN
INTRAMUSCULAR | Status: AC
  Filled 2022-10-09: qty 2

## 2022-10-09 MED ORDER — LIDOCAINE-EPINEPHRINE (PF) 1.5 %-1:200000 IJ SOLN
INTRAMUSCULAR | Status: DC | PRN
  Administered 2022-10-09: 5 mL via PERINEURAL

## 2022-10-09 MED ORDER — CEFAZOLIN SODIUM-DEXTROSE 2-4 GM/100ML-% IV SOLN
2.0000 g | INTRAVENOUS | Status: AC
  Administered 2022-10-09: 2 g via INTRAVENOUS
  Filled 2022-10-09: qty 100

## 2022-10-09 MED ORDER — OXYCODONE HCL 5 MG PO TABS: 5.0000 mg | ORAL_TABLET | Freq: Once | ORAL | Status: DC | PRN

## 2022-10-09 MED ORDER — LIDOCAINE HCL (PF) 1 % IJ SOLN
INTRAMUSCULAR | Status: AC
  Filled 2022-10-09: qty 30

## 2022-10-09 MED ORDER — DEXAMETHASONE SODIUM PHOSPHATE 10 MG/ML IJ SOLN
INTRAMUSCULAR | Status: DC | PRN
  Administered 2022-10-09: 5 mg via INTRAVENOUS

## 2022-10-09 MED ORDER — OXYCODONE HCL 5 MG/5ML PO SOLN: 5.0000 mg | Freq: Once | ORAL | Status: DC | PRN

## 2022-10-09 MED ORDER — PROPOFOL 500 MG/50ML IV EMUL
INTRAVENOUS | Status: DC | PRN
  Administered 2022-10-09: 2 mg via INTRAVENOUS
  Administered 2022-10-09: 40 mg via INTRAVENOUS
  Administered 2022-10-09: 20 mg via INTRAVENOUS
  Administered 2022-10-09: 150 ug/kg/min via INTRAVENOUS

## 2022-10-09 MED ORDER — ACETAMINOPHEN 10 MG/ML IV SOLN: 1000.0000 mg | Freq: Once | INTRAVENOUS | Status: DC | PRN

## 2022-10-09 MED ORDER — BUPIVACAINE-EPINEPHRINE (PF) 0.5% -1:200000 IJ SOLN
INTRAMUSCULAR | Status: DC | PRN
  Administered 2022-10-09: 25 mL via PERINEURAL

## 2022-10-09 MED ORDER — ACETAMINOPHEN 500 MG PO TABS: 1000.0000 mg | ORAL_TABLET | Freq: Once | ORAL | Status: DC | PRN

## 2022-10-09 MED ORDER — FENTANYL CITRATE (PF) 250 MCG/5ML IJ SOLN
INTRAMUSCULAR | Status: DC | PRN
  Administered 2022-10-09 (×2): 25 ug via INTRAVENOUS
  Administered 2022-10-09 (×2): 50 ug via INTRAVENOUS

## 2022-10-09 MED ORDER — 0.9 % SODIUM CHLORIDE (POUR BTL) OPTIME
TOPICAL | Status: DC | PRN
  Administered 2022-10-09 (×2): 1000 mL

## 2022-10-09 MED ORDER — ACETAMINOPHEN 160 MG/5ML PO SOLN: 1000.0000 mg | Freq: Once | ORAL | Status: DC | PRN

## 2022-10-09 MED ORDER — LIDOCAINE HCL (PF) 1 % IJ SOLN
INTRAMUSCULAR | Status: DC | PRN
  Administered 2022-10-09: 10 mL

## 2022-10-09 MED ORDER — ONDANSETRON HCL 4 MG/2ML IJ SOLN
INTRAMUSCULAR | Status: DC | PRN
  Administered 2022-10-09: 4 mg via INTRAVENOUS

## 2022-10-09 SURGICAL SUPPLY — 55 items
ASMB FX 20X17 STPL INSRTR (Staple) ×1 IMPLANT
BAG COUNTER SPONGE SURGICOUNT (BAG) ×1 IMPLANT
BAG SPNG CNTER NS LX DISP (BAG) ×1
BANDAGE ESMARK 6X9 LF (GAUZE/BANDAGES/DRESSINGS) IMPLANT
BIT DRILL 4.5 TYB 9 (BIT) IMPLANT
BLADE SAW SGTL HD 18.5X60.5X1. (BLADE) ×1 IMPLANT
BLADE SURG 10 STRL SS (BLADE) IMPLANT
BLADE SURG 21 STRL SS (BLADE) IMPLANT
BNDG CMPR 5X4 CHSV STRCH STRL (GAUZE/BANDAGES/DRESSINGS) ×1
BNDG CMPR 9X6 STRL LF SNTH (GAUZE/BANDAGES/DRESSINGS)
BNDG COHESIVE 4X5 TAN STRL (GAUZE/BANDAGES/DRESSINGS) ×1 IMPLANT
BNDG COHESIVE 4X5 TAN STRL LF (GAUZE/BANDAGES/DRESSINGS) IMPLANT
BNDG ESMARK 6X9 LF (GAUZE/BANDAGES/DRESSINGS)
BNDG GAUZE DERMACEA FLUFF 4 (GAUZE/BANDAGES/DRESSINGS) ×2 IMPLANT
BNDG GZE DERMACEA 4 6PLY (GAUZE/BANDAGES/DRESSINGS) ×1
BUR EGG ELITE 4.0 (BURR) IMPLANT
COTTON STERILE ROLL (GAUZE/BANDAGES/DRESSINGS) ×1 IMPLANT
COVER MAYO STAND STRL (DRAPES) IMPLANT
COVER SURGICAL LIGHT HANDLE (MISCELLANEOUS) ×2 IMPLANT
DRAPE INCISE IOBAN 66X45 STRL (DRAPES) ×1 IMPLANT
DRAPE OEC MINIVIEW 54X84 (DRAPES) IMPLANT
DRAPE U-SHAPE 47X51 STRL (DRAPES) ×1 IMPLANT
DRSG ADAPTIC 3X8 NADH LF (GAUZE/BANDAGES/DRESSINGS) ×1 IMPLANT
DURAPREP 26ML APPLICATOR (WOUND CARE) ×1 IMPLANT
ELECT REM PT RETURN 9FT ADLT (ELECTROSURGICAL) ×1
ELECTRODE REM PT RTRN 9FT ADLT (ELECTROSURGICAL) ×1 IMPLANT
GAUZE PAD ABD 8X10 STRL (GAUZE/BANDAGES/DRESSINGS) IMPLANT
GAUZE SPONGE 4X4 12PLY STRL (GAUZE/BANDAGES/DRESSINGS) ×1 IMPLANT
GLOVE BIOGEL PI IND STRL 9 (GLOVE) ×1 IMPLANT
GLOVE SURG ORTHO 9.0 STRL STRW (GLOVE) ×1 IMPLANT
GOWN STRL REUS W/ TWL XL LVL3 (GOWN DISPOSABLE) ×3 IMPLANT
GOWN STRL REUS W/TWL XL LVL3 (GOWN DISPOSABLE) ×3
GUIDEWIRE TYB 2X9 (WIRE) IMPLANT
KIT BASIN OR (CUSTOM PROCEDURE TRAY) ×1 IMPLANT
KIT STAPLE ARCUS 20X17 STRL (Staple) IMPLANT
KIT TURNOVER KIT B (KITS) ×1 IMPLANT
MANIFOLD NEPTUNE II (INSTRUMENTS) ×1 IMPLANT
NDL HYPO 22X1.5 SAFETY MO (MISCELLANEOUS) IMPLANT
NEEDLE HYPO 22X1.5 SAFETY MO (MISCELLANEOUS) ×1 IMPLANT
NS IRRIG 1000ML POUR BTL (IV SOLUTION) ×1 IMPLANT
PACK ORTHO EXTREMITY (CUSTOM PROCEDURE TRAY) ×1 IMPLANT
PAD ARMBOARD 7.5X6 YLW CONV (MISCELLANEOUS) ×2 IMPLANT
PAD CAST 4YDX4 CTTN HI CHSV (CAST SUPPLIES) ×1 IMPLANT
PADDING CAST COTTON 4X4 STRL (CAST SUPPLIES)
PUTTY DBM STAGRAFT PLUS 5CC (Putty) IMPLANT
SCREW CANN HDLS SHT 6.5X70 (Screw) IMPLANT
SPONGE T-LAP 18X18 ~~LOC~~+RFID (SPONGE) ×1 IMPLANT
STAPLE ASSEMBLY ARCUS 20X17 (Staple) IMPLANT
SUCTION TUBE FRAZIER 10FR DISP (MISCELLANEOUS) ×1 IMPLANT
SUT ETHILON 2 0 PSLX (SUTURE) ×3 IMPLANT
SYR CONTROL 10ML LL (SYRINGE) IMPLANT
TOWEL GREEN STERILE (TOWEL DISPOSABLE) ×1 IMPLANT
TOWEL GREEN STERILE FF (TOWEL DISPOSABLE) ×1 IMPLANT
TUBE CONNECTING 12X1/4 (SUCTIONS) ×1 IMPLANT
WATER STERILE IRR 1000ML POUR (IV SOLUTION) ×1 IMPLANT

## 2022-10-09 NOTE — Anesthesia Preprocedure Evaluation (Signed)
Anesthesia Evaluation  Patient identified by MRN, date of birth, ID band Patient awake    Reviewed: Allergy & Precautions, NPO status , Patient's Chart, lab work & pertinent test results  History of Anesthesia Complications Negative for: history of anesthetic complications  Airway Mallampati: III  TM Distance: >3 FB Neck ROM: Full    Dental  (+) Teeth Intact, Dental Advisory Given   Pulmonary neg shortness of breath, asthma , neg sleep apnea, neg recent URI, former smoker   breath sounds clear to auscultation       Cardiovascular hypertension, Pt. on medications (-) angina (-) Past MI and (-) CHF  Rhythm:Regular     Neuro/Psych  PSYCHIATRIC DISORDERS Anxiety Depression    negative neurological ROS     GI/Hepatic ,GERD  ,,  Endo/Other  negative endocrine ROS    Renal/GU negative Renal ROS     Musculoskeletal  (+) Arthritis ,  osterior Tibial Tendon Insufficiency   Abdominal   Peds  Hematology negative hematology ROS (+) Lab Results      Component                Value               Date                      WBC                      6.3                 09/12/2022                HGB                      12.6                09/12/2022                HCT                      38.4                09/12/2022                MCV                      94.8                09/12/2022                PLT                      266                 09/12/2022              Anesthesia Other Findings   Reproductive/Obstetrics                             Anesthesia Physical Anesthesia Plan  ASA: 2  Anesthesia Plan: MAC and Regional   Post-op Pain Management: Regional block*   Induction: Intravenous  PONV Risk Score and Plan: 2 and Propofol infusion  Airway Management Planned: Nasal Cannula, Natural Airway and Simple Face Mask  Additional Equipment: None  Intra-op Plan:   Post-operative Plan:    Informed Consent:  I have reviewed the patients History and Physical, chart, labs and discussed the procedure including the risks, benefits and alternatives for the proposed anesthesia with the patient or authorized representative who has indicated his/her understanding and acceptance.     Dental advisory given  Plan Discussed with: CRNA  Anesthesia Plan Comments:        Anesthesia Quick Evaluation

## 2022-10-09 NOTE — Op Note (Signed)
10/09/2022  9:40 AM  PATIENT:  Alejandra Crane    PRE-OPERATIVE DIAGNOSIS:  Posterior Tibial Tendon Insufficiency right  POST-OPERATIVE DIAGNOSIS:  Same  PROCEDURE:  RIGHT TALONAVICULAR AND SUBTALAR FUSION  SURGEON:  Nadara Mustard, MD  PHYSICIAN ASSISTANT: Shela Nevin ANESTHESIA:   General  PREOPERATIVE INDICATIONS:  [name] is a  72 y.o. female with a diagnosis of Posterior Tibial Tendon Insufficiency who failed conservative measures and elected for surgical management.    The risks benefits and alternatives were discussed with the patient preoperatively including but not limited to the risks of infection, bleeding, nerve injury, cardiopulmonary complications, the need for revision surgery, among others, and the patient was willing to proceed.  OPERATIVE IMPLANTS:   Implant Name Type Inv. Item Serial No. Manufacturer Lot No. LRB No. Used Action  PUTTY DBM STAGRAFT PLUS 5CC - [id number] Putty PUTTY DBM STAGRAFT PLUS 5CC  [employer](ORTH,TRAU,BIO,SG) [id number] Right 1 Implanted  KIT STAPLE ARCUS 20X17 STRL - [id number] Staple KIT STAPLE ARCUS 20X17 STRL  [employer](ORTH,TRAU,BIO,SG) [id number] A Right 1 Implanted  STAPLE ASSEMBLY ARCUS 20X17 - [id number] Staple STAPLE ASSEMBLY ARCUS 20X17  [employer](ORTH,TRAU,BIO,SG) [id number] A Right 1 Implanted  SCREW CANN HDLS SHT 6.5X70 - [id number] Screw SCREW CANN HDLS SHT 6.5X70  [employer](ORTH,TRAU,BIO,SG)  Right 1 Implanted    @ENCIMAGES @  OPERATIVE FINDINGS: C-arm possibly verified reduction of the subtalar and talonavicular joints.  OPERATIVE PROCEDURE: Patient was brought the operating room after undergoing a popliteal block.  After adequate levels anesthesia obtained patient's right lower extremity was prepped using DuraPrep draped into a sterile field a timeout was called.  Patient also underwent a local infiltration of the saphenous nerve medially.  An oblique incision was made over the sinus Tarsi this was carried down to  the subtalar joint.  Retractors were placed to protect the peroneal tendons and the dorsal neurovascular bundles.  A bur was used to debride the subtalar joint this was finished with a curette.  The wound was irrigated with normal saline.  The joint was filled with state graft putty reduced the heel was placed in slight valgus and a K wire was inserted from the calcaneus up to the talus.  See arthroscopy verified alignment of the K wire and a 70 mm screw was used to stabilize the subtalar joint.  Attention was then focused on the talonavicular joint.  A dorsal incision was made between the anterior tibial tendon and the EHL tendon this was carried down to the talonavicular joint.  Retractors were placed to protect the tendons of the dorsal neurovascular bundle.  A bur was used to debride the talonavicular joint.  There is large osteophytic bone spurs off the dorsal talus.  The debride was completed with a curette the joint was filled with state graft putty and 2 staples were used to secure the talonavicular joint.  C-arm Shrosbree verified reduction.  The wounds were irrigated normal saline incision was closed using 2-0 nylon a sterile dressing was applied patient was taken the PACU in stable condition.   DISCHARGE PLANNING:  Antibiotic duration: Preoperative antibiotics  Weightbearing: Touchdown weightbearing on the right  Pain medication: Patient request Ultram  Dressing care/ Wound VAC: Dry dressing follow-up in the office 1 week to change dressing  Ambulatory devices: Crutch with walker or kneeling scooter  Discharge to: Patient request discharge to home.  Follow-up: In the office 1 week post operative.

## 2022-10-09 NOTE — H&P (Signed)
Alejandra Crane is an 72 y.o. female.   Chief Complaint: Right foot pain and deformity. HPI: Patient is a 72 year old woman who is seen for initial evaluation and referral from Dr. Roda Shutters, patient has posterior tibial tendon insufficiency on the right with a pronated valgus foot she has had temporary relief with the fracture boot and supportive sneakers but is still symptomatic.   Past Medical History:  Diagnosis Date   Anemia    Anxiety    Arthritis    Asthma    Depression    Diverticulosis    noted on colonscopy    Elevated cholesterol    GERD (gastroesophageal reflux disease)    H/O mammogram    2015, 2016   Hepatitis    Herpes    Not gential   High blood pressure    History of esophagogastroduodenoscopy (EGD)    2014, 2016   History of PFTs 10/26/2012   Lower GI bleed 01/19/2015   Related to mobic 15 mg    Mitral valve prolapse 09/06/1996   Osteoarthritis of both knees    Pneumonia     Past Surgical History:  Procedure Laterality Date   BREAST EXCISIONAL BIOPSY Left    benign   BREAST SURGERY  05/20/2007   [facility], Dr.William Purkert    COLONOSCOPY     2014, 2016 Dr.Bajani Manalo, Dr.Mahmood Abedi    KNEE ARTHROSCOPY Right     Family History  Problem Relation Age of Onset   Heart attack Mother    CVA Mother    Diabetes Mother    Congestive Heart Failure Mother    Stroke Mother    Heart disease Mother    Hyperlipidemia Mother    Hypertension Mother    Depression Mother    Obesity Mother    Lung cancer Sister 39   Stomach cancer Brother    Neurologic Disorder Son    Colon cancer Niece    Diabetes Maternal Grandmother    Social History:  reports that she quit smoking about 36 years ago. Her smoking use included cigarettes. She has a 2.50 pack-year smoking history. She has never used smokeless tobacco. She reports current alcohol use. She reports that she does not use drugs.  Allergies:  Allergies  Allergen Reactions   Mobic [Meloxicam]     GI bleed     Medications Prior to Admission  Medication Sig Dispense Refill   calcium carbonate (CALCIUM 600) 600 MG TABS tablet Take 1 tablet (600 mg total) by mouth 2 (two) times daily with a meal. 60 tablet 11   Cholecalciferol (VITAMIN D3) 50 MCG (2000 UT) capsule Take 1 capsule (2,000 Units total) by mouth daily. 30 capsule 11   hydrochlorothiazide (HYDRODIURIL) 25 MG tablet Take 1 tablet (25 mg total) by mouth daily. 90 tablet 1   rosuvastatin (CRESTOR) 10 MG tablet Take 1 tablet (10 mg total) by mouth daily. 90 tablet 3   sertraline (ZOLOFT) 100 MG tablet Take 1 tablet (100 mg total) by mouth daily. 90 tablet 1   valACYclovir (VALTREX) 500 MG tablet Take 1 tablet (500 mg total) by mouth daily. 90 tablet 1    No results found for this or any previous visit (from the past 48 hour(s)). No results found.  Review of Systems  All other systems reviewed and are negative.   There were no vitals taken for this visit. Physical Exam  Patient is alert, oriented, no adenopathy, well-dressed, normal affect, normal respiratory effort. Examination patient has a good dorsalis pedis  and posterior tibial pulse she has a pronated valgus foot she cannot do a single limb heel raise she is not tender to palpation the sinus Tarsi she has tenderness to palpation along the posterior tibial tendon.  MRI scan does show tearing of the posterior tibial tendon.  Patient has swelling along the course of the posterior tibial tendon. Assessment/Plan 1. Posterior tibial tendinitis of right lower extremity       Plan: With patient's posterior tibial tendon insufficiency and insufficient relief with conservative treatment patient states she would like to proceed with subtalar and talonavicular fusion.  Risk and benefits were discussed postoperative course was discussed patient states she understands and wishes to proceed at this time.  Nadara Mustard, MD 10/09/2022, 6:24 AM

## 2022-10-09 NOTE — Transfer of Care (Signed)
Immediate Anesthesia Transfer of Care Note  Patient: Alejandra Crane  Procedure(s) Performed: RIGHT TALONAVICULAR AND SUBTALAR FUSION (Right: Ankle)  Patient Location: PACU  Anesthesia Type:MAC and Regional  Level of Consciousness: awake  Airway & Oxygen Therapy: Patient Spontanous Breathing  Post-op Assessment: Report given to RN and Post -op Vital signs reviewed and stable  Post vital signs: Reviewed and stable  Last Vitals:  Vitals Value Taken Time  BP 119/95 (101)   Temp    Pulse 89 10/09/22 0940  Resp 20 10/09/22 0940  SpO2 97 % 10/09/22 0940  Vitals shown include unvalidated device data.  Last Pain:  Vitals:   10/09/22 0646  TempSrc:   PainSc: 3          Complications: No notable events documented.

## 2022-10-09 NOTE — Anesthesia Procedure Notes (Signed)
Anesthesia Regional Block: Popliteal block   Pre-Anesthetic Checklist: , timeout performed,  Correct Patient, Correct Site, Correct Laterality,  Correct Procedure, Correct Position, site marked,  Risks and benefits discussed,  Surgical consent,  Pre-op evaluation,  At surgeon's request and post-op pain management  Laterality: Right and Lower  Prep: chloraprep       Needles:  Injection technique: Single-shot      Needle Length: 9cm  Needle Gauge: 22     Additional Needles: Arrow StimuQuik ECHO Echogenic Stimulating PNB Needle  Procedures:,,,, ultrasound used (permanent image in chart),,    Narrative:  Start time: 10/09/2022 8:08 AM End time: 10/09/2022 8:15 AM Injection made incrementally with aspirations every 5 mL.  Performed by: Personally  Anesthesiologist: Val Eagle, MD

## 2022-10-09 NOTE — Telephone Encounter (Signed)
[employer] forms received. To [employer]. 

## 2022-10-09 NOTE — Progress Notes (Signed)
Orthopedic Tech Progress Note Patient Details:  Juliett Stjohn 14-Jul-1950 [id number]  Ortho Devices Type of Ortho Device: CAM walker, Crutches Ortho Device/Splint Location: RLE Ortho Device/Splint Interventions: Ordered, Adjustment   Post Interventions Patient Tolerated: Well Instructions Provided: Adjustment of device, Poper ambulation with device  Deissy Guilbert E Randy Whitener 10/09/2022, 11:13 AM

## 2022-10-10 ENCOUNTER — Encounter (HOSPITAL_COMMUNITY): Payer: Self-pay | Admitting: Orthopedic Surgery

## 2022-10-10 ENCOUNTER — Encounter: Payer: Medicare Other | Admitting: Nurse Practitioner

## 2022-10-10 NOTE — Anesthesia Postprocedure Evaluation (Signed)
Anesthesia Post Note  Patient: Alejandra Crane  Procedure(s) Performed: RIGHT TALONAVICULAR AND SUBTALAR FUSION (Right: Ankle)     Patient location during evaluation: PACU Anesthesia Type: Regional and MAC Level of consciousness: awake and alert Pain management: pain level controlled Vital Signs Assessment: post-procedure vital signs reviewed and stable Respiratory status: spontaneous breathing, nonlabored ventilation and respiratory function stable Cardiovascular status: blood pressure returned to baseline and stable Postop Assessment: no apparent nausea or vomiting Anesthetic complications: no   No notable events documented.  Last Vitals:  Vitals:   10/09/22 1015 10/09/22 1030  BP: 139/84 135/87  Pulse: 84 71  Resp: 16 12  Temp:  36.6 C  SpO2: 94% 98%    Last Pain:  Vitals:   10/09/22 1030  TempSrc:   PainSc: 0-No pain                 Ashaki Frosch

## 2022-10-11 ENCOUNTER — Ambulatory Visit (INDEPENDENT_AMBULATORY_CARE_PROVIDER_SITE_OTHER): Payer: Commercial Managed Care - PPO | Admitting: Nurse Practitioner

## 2022-10-11 ENCOUNTER — Encounter: Payer: Self-pay | Admitting: Nurse Practitioner

## 2022-10-11 DIAGNOSIS — Z0000 Encounter for general adult medical examination without abnormal findings: Secondary | ICD-10-CM | POA: Diagnosis not present

## 2022-10-11 NOTE — Progress Notes (Signed)
  This service is provided via telemedicine  No vital signs collected/recorded due to the encounter was a telemedicine visit.   Location of patient (ex: home, work):  Home  Patient consents to a telephone visit:  Yes  Location of the provider (ex: office, home):  [facility].  Name of any referring provider:  Eubanks, Jessica K, NP   Names of all persons participating in the telemedicine service and their role in the encounter:  Patient, Alejandra Crane, RMA, Jessica Eubanks, NP.    Time spent on call: 8 minutes spent on the phone with Medical Assistant.   

## 2022-10-11 NOTE — Progress Notes (Signed)
Subjective:   Alejandra Crane is a 72 y.o. female who presents for Medicare Annual (Subsequent) preventive examination.  Review of Systems           Objective:    There were no vitals filed for this visit. There is no height or weight on file to calculate BMI.     10/11/2022    3:00 PM 10/09/2022    6:27 AM 09/16/2022    9:44 AM 03/18/2022   10:09 AM 12/03/2021    3:32 PM 10/04/2021    2:58 PM 09/17/2021    8:40 AM  Advanced Directives  Does Patient Have a Medical Advance Directive? Yes Yes Yes Yes Yes Yes Yes  Type of Estate agent of [place];Living will Healthcare Power of [place];Living will Living will;Healthcare Power of [employer] Power of [place];Living will Healthcare Power of [place];Living will Healthcare Power of [place];Living will Healthcare Power of [place];Living will  Does patient want to make changes to medical advance directive? No - Patient declined  No - Patient declined No - Patient declined No - Patient declined No - Patient declined No - Patient declined  Copy of Healthcare Power of Attorney in Chart? Yes - validated most recent copy scanned in chart (See row information) Yes - validated most recent copy scanned in chart (See row information) Yes - validated most recent copy scanned in chart (See row information) Yes - validated most recent copy scanned in chart (See row information) Yes - validated most recent copy scanned in chart (See row information) Yes - validated most recent copy scanned in chart (See row information) Yes - validated most recent copy scanned in chart (See row information)    Current Medications (verified) Outpatient Encounter Medications as of 10/11/2022  Medication Sig   calcium carbonate (CALCIUM 600) 600 MG TABS tablet Take 1 tablet (600 mg total) by mouth 2 (two) times daily with a meal.   Cholecalciferol (VITAMIN D3) 50 MCG (2000 UT) capsule Take 1 capsule (2,000 Units total) by mouth daily.    hydrochlorothiazide (HYDRODIURIL) 25 MG tablet Take 1 tablet (25 mg total) by mouth daily.   rosuvastatin (CRESTOR) 10 MG tablet Take 1 tablet (10 mg total) by mouth daily.   sertraline (ZOLOFT) 100 MG tablet Take 1 tablet (100 mg total) by mouth daily.   traMADol (ULTRAM) 50 MG tablet Take 1 tablet (50 mg total) by mouth every 6 (six) hours as needed.   valACYclovir (VALTREX) 500 MG tablet Take 1 tablet (500 mg total) by mouth daily.   No facility-administered encounter medications on file as of 10/11/2022.    Allergies (verified) Mobic [meloxicam]   History: Past Medical History:  Diagnosis Date   Anemia    Anxiety    Arthritis    Asthma    Depression    Diverticulosis    noted on colonscopy    Elevated cholesterol    GERD (gastroesophageal reflux disease)    H/O mammogram    2015, 2016   Hepatitis    Hep B in 1980s   Herpes    Not gential   High blood pressure    History of esophagogastroduodenoscopy (EGD)    2014, 2016   History of PFTs 10/26/2012   Lower GI bleed 01/19/2015   Related to mobic 15 mg    Mitral valve prolapse 09/06/1996   Osteoarthritis of both knees    Pneumonia    Past Surgical History:  Procedure Laterality Date   BREAST EXCISIONAL BIOPSY Left  benign   BREAST SURGERY  05/20/2007   [facility]ospy, Dr.William Purkert    COLONOSCOPY     2014, 2016 Dr.Bajani Manalo, Dr.Mahmood Abedi    FOOT ARTHRODESIS Right 10/09/2022   Procedure: RIGHT TALONAVICULAR AND SUBTALAR FUSION;  Surgeon: Nadara Mustard, MD;  Location: [facility] OR;  Service: Orthopedics;  Laterality: Right;   KNEE ARTHROSCOPY Right    Family History  Problem Relation Age of Onset   Heart attack Mother    CVA Mother    Diabetes Mother    Congestive Heart Failure Mother    Stroke Mother    Heart disease Mother    Hyperlipidemia Mother    Hypertension Mother    Depression Mother    Obesity Mother    Lung cancer Sister 52   Stomach cancer Brother    Neurologic Disorder Son    Colon cancer  Niece    Diabetes Maternal Grandmother    Social History   Socioeconomic History   Marital status: Divorced    Spouse name: Not on file   Number of children: 0   Years of education: Not on file   Highest education level: Bachelor's degree (e.g., BA, AB, BS)  Occupational History   Occupation: RN  Tobacco Use   Smoking status: Former    Packs/day: 0.25    Years: 10.00    Additional pack years: 0.00    Total pack years: 2.50    Types: Cigarettes    Quit date: 09/16/1986    Years since quitting: 36.0   Smokeless tobacco: Never   Tobacco comments:    Quit in her 55's   Vaping Use   Vaping Use: Never used  Substance and Sexual Activity   Alcohol use: Yes    Comment: Occasionally red wine    Drug use: No   Sexual activity: Not Currently  Other Topics Concern   Not on file  Social History Narrative   Diet: Regular       Caffeine: Yes, infrequently when working       Married, if yes what year: Divorced, married in 1974      Do you live in a house, apartment, assisted living, condo, trailer, ect: House, 1 stories, one person       Pets: None      Current/Past profession:       Exercise: Yes, walking, hiking, and gym    Right handed   One story home   Drinks caffeine, occasionally weekly      Living Will: No   DNR: No   POA/HPOA: No      Questions below not in new patient packet    Functional Status:   Do you have difficulty bathing or dressing yourself?   Do you have difficulty preparing food or eating?   Do you have difficulty managing your medications?   Do you have difficulty managing your finances?   Do you have difficulty affording your medications?   Social Determinants of Health   Financial Resource Strain: Low Risk  (09/12/2022)   Overall Financial Resource Strain (CARDIA)    Difficulty of Paying Living Expenses: Not very hard  Food Insecurity: No Food Insecurity (09/12/2022)   Hunger Vital Sign    Worried About Running Out of Food in the Last Year:  Never true    Ran Out of Food in the Last Year: Never true  Transportation Needs: No Transportation Needs (09/12/2022)   PRAPARE - Administrator, Civil Service (Medical): No  Lack of Transportation (Non-Medical): No  Physical Activity: Unknown (09/12/2022)   Exercise Vital Sign    Days of Exercise per Week: 0 days    Minutes of Exercise per Session: Not on file  Stress: Stress Concern Present (09/12/2022)   [employer] of Occupational Health - Occupational Stress Questionnaire    Feeling of Stress : To some extent  Social Connections: Moderately Integrated (09/12/2022)   Social Connection and Isolation Panel [NHANES]    Frequency of Communication with Friends and Family: Twice a week    Frequency of Social Gatherings with Friends and Family: Once a week    Attends Religious Services: More than 4 times per year    Active Member of [employer] or Organizations: Yes    Attends Engineer, structural: More than 4 times per year    Marital Status: Divorced    Tobacco Counseling Counseling given: Not Answered Tobacco comments: Quit in her 30's    Clinical Intake:              How often do you need to have someone help you when you read instructions, pamphlets, or other written materials from your doctor or pharmacy?: (P) 1 - Never  Diabetic?no         Activities of Daily Living    10/09/2022    6:53 AM 10/09/2022    6:47 AM  In your present state of health, do you have any difficulty performing the following activities:  Hearing?  0  Vision?  1  Difficulty concentrating or making decisions?  0  Walking or climbing stairs?  1  Dressing or bathing?  0  Doing errands, shopping? 0     Patient Care Team: [name], NP as PCP - General (Geriatric Medicine) Drema Dallas, DO as Consulting Physician (Neurology)  Indicate any recent Medical Services you may have received from other than [facility] providers in the past year (date may be approximate).      Assessment:   This is a routine wellness examination for Alejandra Crane.  Hearing/Vision screen Hearing Screening - Comments:: No hearing concerns.  Vision Screening - Comments:: No vision concerns. Patient wears prescription glasses. Patient last eye exam November 2023.  Dietary issues and exercise activities discussed:     Goals Addressed   None    Depression Screen    10/11/2022    2:57 PM 09/16/2022   10:11 AM 10/04/2021    3:00 PM 03/12/2021   10:10 AM 10/03/2020   10:33 AM 03/15/2020    1:09 PM 06/22/2019    9:00 AM  PHQ 2/9 Scores  PHQ - 2 Score 0 0 0 0 0 0 6  PHQ- 9 Score       19    Fall Risk    10/11/2022    2:57 PM 10/07/2022    5:51 PM 09/16/2022   10:11 AM 03/18/2022   10:40 AM 12/03/2021    3:28 PM  Fall Risk   Falls in the past year? 0 0 1 1 0  Number falls in past yr: 0 0 0 0 0  Injury with Fall? 0 0 0 0 0  Risk for fall due to : No Fall Risks  No Fall Risks No Fall Risks No Fall Risks  Follow up Falls evaluation completed  Falls evaluation completed Falls evaluation completed Falls evaluation completed    FALL RISK PREVENTION PERTAINING TO THE HOME:  Any stairs in or around the home? No  If so, are there any  without handrails? na Home free of loose throw rugs in walkways, pet beds, electrical cords, etc? Yes  Adequate lighting in your home to reduce risk of falls? Yes   ASSISTIVE DEVICES UTILIZED TO PREVENT FALLS:  Life alert? Yes  Use of a cane, walker or w/c? No  Grab bars in the bathroom? No  Shower chair or bench in shower? Yes  Elevated toilet seat or a handicapped toilet? No   TIMED UP AND GO:  Was the test performed? No .    Cognitive Function:    03/23/2018   10:58 AM 03/21/2017    9:14 AM  MMSE - Mini Mental State Exam  Orientation to time 5 5  Orientation to Place 5 5  Registration 3 3  Attention/ Calculation 5 5  Recall 3 3  Language- name 2 objects 2 2  Language- repeat 1 1  Language- follow 3 step command 3 3  Language- read &  follow direction 1 1  Write a sentence 1 1  Copy design 0 1  Total score 29 30        10/11/2022    2:58 PM 10/04/2021    3:04 PM 10/03/2020   10:36 AM  6CIT Screen  What Year? 0 points 0 points 0 points  What month? 0 points 0 points 0 points  What time? 0 points 0 points 0 points  Count back from 20 0 points 0 points 0 points  Months in reverse 0 points 0 points 0 points  Repeat phrase 0 points 0 points 0 points  Total Score 0 points 0 points 0 points    Immunizations Immunization History  Administered Date(s) Administered   Fluad Quad(high Dose 65+) 02/22/2020, 02/27/2021, 02/26/2022   Hepatitis A 01/21/2007, 04/12/2008   Influenza, High Dose Seasonal PF 02/25/2018, 02/04/2019   Influenza, Seasonal, Injecte, Preservative Fre 02/05/2017   PFIZER Comirnaty(Gray Top)Covid-19 Tri-Sucrose Vaccine 11/08/2020   PFIZER(Purple Top)SARS-COV-2 Vaccination 05/05/2019, 05/26/2019, 03/17/2020   Pneumococcal Conjugate-13 12/25/2015   Pneumococcal Polysaccharide-23 01/13/2012, 03/21/2017   Td 03/06/2005, 01/23/2012   Tdap 10/18/2014   Zoster Recombinat (Shingrix) 05/19/2018, 11/03/2018   Zoster, Live 05/06/2012    TDAP status: Up to date  Flu Vaccine status: Up to date  Pneumococcal vaccine status: Up to date  Covid-19 vaccine status: Information provided on how to obtain vaccines.   Qualifies for Shingles Vaccine? Yes   Zostavax completed No   Shingrix Completed?: Yes  Screening Tests Health Maintenance  Topic Date Due   COVID-19 Vaccine (5 - 2023-24 season) 01/04/2022   Medicare Annual Wellness (AWV)  10/05/2022   INFLUENZA VACCINE  12/05/2022   MAMMOGRAM  03/06/2024   Colonoscopy  05/06/2024   DTaP/Tdap/Td (4 - Td or Tdap) 10/17/2024   Pneumonia Vaccine 36+ Years old  Completed   DEXA SCAN  Completed   Hepatitis C Screening  Completed   Zoster Vaccines- Shingrix  Completed   HPV VACCINES  Aged Out    Health Maintenance  Health Maintenance Due  Topic Date Due    COVID-19 Vaccine (5 - 2023-24 season) 01/04/2022   Medicare Annual Wellness (AWV)  10/05/2022    Colorectal cancer screening: Type of screening: Colonoscopy. Completed 01/23/2015. Repeat every 10 years  Mammogram status: Completed 03/2022. Repeat every year  Bone Density status: Completed 03/2022. Results reflect: Bone density results: OSTEOPENIA. Repeat every 2 years.  Lung Cancer Screening: (Low Dose CT Chest recommended if Age 16-80 years, 30 pack-year currently smoking OR have quit w/in 15years.) does not qualify.  Lung Cancer Screening Referral: na  Additional Screening:  Hepatitis C Screening: does qualify; Completed   Vision Screening: Recommended annual ophthalmology exams for early detection of glaucoma and other disorders of the eye. Is the patient up to date with their annual eye exam?  Yes  Who is the provider or what is the name of the office in which the patient attends annual eye exams? [facility] If pt is not established with a provider, would they like to be referred to a provider to establish care? No .   Dental Screening: Recommended annual dental exams for proper oral hygiene  Community Resource Referral / Chronic Care Management: CRR required this visit?  No   CCM required this visit?  No      Plan:     I have personally reviewed and noted the following in the patient's chart:   Medical and social history Use of alcohol, tobacco or illicit drugs  Current medications and supplements including opioid prescriptions. Patient is not currently taking opioid prescriptions. Functional ability and status Nutritional status Physical activity Advanced directives List of other physicians Hospitalizations, surgeries, and ER visits in previous 12 months Vitals Screenings to include cognitive, depression, and falls Referrals and appointments  In addition, I have reviewed and discussed with patient certain preventive protocols, quality metrics, and best  practice recommendations. A written personalized care plan for preventive services as well as general preventive health recommendations were provided to patient.     [name], NP   10/11/2022   Virtual Visit via Video Note  I connected with Alejandra Crane on 10/11/22 at  3:20 PM EDT by a video enabled telemedicine application and verified that I am speaking with the correct person using two identifiers.  Location: Patient: home Provider: [facility]   I discussed the limitations of evaluation and management by telemedicine and the availability of in person appointments. The patient expressed understanding and agreed to proceed.    I discussed the assessment and treatment plan with the patient. The patient was provided an opportunity to ask questions and all were answered. The patient agreed with the plan and demonstrated an understanding of the instructions.   The patient was advised to call back or seek an in-person evaluation if the symptoms worsen or if the condition fails to improve as anticipated.  I provided 15 minutes of non-face-to-face time during this encounter.  Alejandra Crane, AGNP Avs printed and mailed.

## 2022-10-17 ENCOUNTER — Encounter: Payer: Self-pay | Admitting: Orthopedic Surgery

## 2022-10-17 ENCOUNTER — Ambulatory Visit (INDEPENDENT_AMBULATORY_CARE_PROVIDER_SITE_OTHER): Payer: Commercial Managed Care - PPO | Admitting: Orthopedic Surgery

## 2022-10-17 ENCOUNTER — Other Ambulatory Visit (INDEPENDENT_AMBULATORY_CARE_PROVIDER_SITE_OTHER): Payer: Commercial Managed Care - PPO

## 2022-10-17 ENCOUNTER — Telehealth: Payer: Self-pay | Admitting: Orthopedic Surgery

## 2022-10-17 DIAGNOSIS — Z981 Arthrodesis status: Secondary | ICD-10-CM | POA: Diagnosis not present

## 2022-10-17 DIAGNOSIS — M76821 Posterior tibial tendinitis, right leg: Secondary | ICD-10-CM

## 2022-10-17 NOTE — Telephone Encounter (Signed)
[employer] forms received. To [employer]. 

## 2022-10-17 NOTE — Progress Notes (Signed)
Office Visit Note   Patient: Alejandra Crane           Date of Birth: [birth year]           MRN: [id number] Visit Date: 10/17/2022              Requested by: [name], NP [address] [place]. [place],  [place] [address] PCP: [name], NP  Chief Complaint  Patient presents with   Right Ankle - Routine Post Op    10/09/22 right talonavicular subtalar fusion      HPI: Patient is a 72 year old woman who is 1 week status post right subtalar and talonavicular fusion.  Assessment & Plan: Visit Diagnoses:  1. S/P ankle fusion   2. Posterior tibial tendinitis of right lower extremity     Plan: She will start working on range of motion of the ankle continue elevation continue protected weightbearing.  At follow-up in 1 week to evaluate to harvest sutures and will start her with a compression sock.  Follow-Up Instructions: No follow-ups on file.   Ortho Exam  Patient is alert, oriented, no adenopathy, well-dressed, normal affect, normal respiratory effort. Examination there is swelling she has decreased dorsiflexion of the ankle.  Wound edges are well-approximated no signs of infection.  Imaging: XR Ankle Complete Right  Result Date: 10/17/2022 Three-view radiographs of of the right foot shows stable talonavicular and subtalar fusion.  No images are attached to the encounter.  Labs: Lab Results  Component Value Date   HGBA1C 5.6 03/12/2021   HGBA1C 5.8 (H) 08/02/2020   HGBA1C 5.9 (H) 01/19/2020   ESRSEDRATE 29 05/19/2019   LABURIC 4.3 12/03/2021     Lab Results  Component Value Date   ALBUMIN 4.1 08/02/2020   ALBUMIN 4.0 01/19/2020   ALBUMIN 2.5 (L) 08/23/2019    Lab Results  Component Value Date   MG 1.9 08/23/2019   Lab Results  Component Value Date   VD25OH 42 09/12/2022   VD25OH 37 03/12/2021   VD25OH 90.3 08/02/2020    No results found for: "PREALBUMIN"    Latest Ref Rng & Units 09/12/2022    9:43 AM 12/03/2021    3:44 PM 09/17/2021    10:12 AM  CBC EXTENDED  WBC 3.8 - 10.8 Thousand/uL 6.3  7.2  5.9   RBC 3.80 - 5.10 Million/uL 4.05  3.97  3.89   Hemoglobin 11.7 - 15.5 g/dL 19.1  47.8  29.5   HCT 35.0 - 45.0 % 38.4  37.7  37.6   Platelets 140 - 400 Thousand/uL 266  261  264   NEUT# 1,500 - 7,800 cells/uL 3,219  3,722  2,938   Lymph# 850 - 3,900 cells/uL 2,463  2,714  2,407      There is no height or weight on file to calculate BMI.  Orders:  Orders Placed This Encounter  Procedures   XR Ankle Complete Right   No orders of the defined types were placed in this encounter.    Procedures: No procedures performed  Clinical Data: No additional findings.  ROS:  All other systems negative, except as noted in the HPI. Review of Systems  Objective: Vital Signs: There were no vitals taken for this visit.  Specialty Comments:  No specialty comments available.  PMFS History: Patient Active Problem List   Diagnosis Date Noted   Posterior tibial tendon dysfunction, right 10/09/2022   Primary osteoarthritis of left hand 09/16/2022   Arthritis of carpometacarpal [facility]) joint of left thumb  10/09/2021   History of GI diverticular bleed 09/16/2019   Acute lower GI bleeding 08/22/2019   Orthostatic syncope 08/22/2019   Osteopenia 09/16/2018   Essential hypertension 02/19/2017   Mixed hyperlipidemia 02/19/2017   Depression, major, single episode, complete remission (HCC) 02/19/2017   Herpes genitalis in women 02/19/2017   Past Medical History:  Diagnosis Date   Anemia    Anxiety    Arthritis    Asthma    Depression    Diverticulosis    noted on colonscopy    Elevated cholesterol    GERD (gastroesophageal reflux disease)    H/O mammogram    2015, 2016   Hepatitis    Hep B in 1980s   Herpes    Not gential   High blood pressure    History of esophagogastroduodenoscopy (EGD)    2014, 2016   History of PFTs 10/26/2012   Lower GI bleed 01/19/2015   Related to mobic 15 mg    Mitral valve prolapse  09/06/1996   Osteoarthritis of both knees    Pneumonia     Family History  Problem Relation Age of Onset   Heart attack Mother    CVA Mother    Diabetes Mother    Congestive Heart Failure Mother    Stroke Mother    Heart disease Mother    Hyperlipidemia Mother    Hypertension Mother    Depression Mother    Obesity Mother    Lung cancer Sister 57   Stomach cancer Brother    Neurologic Disorder Son    Colon cancer Niece    Diabetes Maternal Grandmother     Past Surgical History:  Procedure Laterality Date   BREAST EXCISIONAL BIOPSY Left    benign   BREAST SURGERY  05/20/2007   Biospy, Dr.William Purkert    COLONOSCOPY     2014, 2016 Dr.Bajani Manalo, Dr.Mahmood Abedi    FOOT ARTHRODESIS Right 10/09/2022   Procedure: RIGHT TALONAVICULAR AND SUBTALAR FUSION;  Surgeon: Nadara Mustard, MD;  Location: [facility] OR;  Service: Orthopedics;  Laterality: Right;   KNEE ARTHROSCOPY Right    Social History   Occupational History   Occupation: Charity fundraiser  Tobacco Use   Smoking status: Former    Packs/day: 0.25    Years: 10.00    Additional pack years: 0.00    Total pack years: 2.50    Types: Cigarettes    Quit date: 09/16/1986    Years since quitting: 36.1   Smokeless tobacco: Never   Tobacco comments:    Quit in her 10's   Vaping Use   Vaping Use: Never used  Substance and Sexual Activity   Alcohol use: Yes    Comment: Occasionally red wine    Drug use: No   Sexual activity: Not Currently      Should start clear Claritin

## 2022-10-24 ENCOUNTER — Ambulatory Visit (INDEPENDENT_AMBULATORY_CARE_PROVIDER_SITE_OTHER): Payer: Commercial Managed Care - PPO | Admitting: Orthopedic Surgery

## 2022-10-24 DIAGNOSIS — M76821 Posterior tibial tendinitis, right leg: Secondary | ICD-10-CM

## 2022-10-31 ENCOUNTER — Encounter: Payer: Self-pay | Admitting: Orthopedic Surgery

## 2022-10-31 NOTE — Progress Notes (Addendum)
Office Visit Note   Patient: Alejandra Crane           Date of Birth: 1950-11-17           MRN: [id number] Visit Date: 10/24/2022              Requested by: [name], NP [address] [place]. [place],  [place] [address] PCP: [name], NP  Chief Complaint  Patient presents with   Right Ankle - Routine Post Op      10/09/22 right talonavicular subtalar fusion        HPI: Patient is a 72 year old woman who is 2 weeks status post right talonavicular and subtalar fusion.  Patient states she has been nonweightbearing and elevating her foot is much as possible.  Patient states that she has night sweats.  Assessment & Plan: Visit Diagnoses:  1. Posterior tibial tendinitis of right lower extremity     Plan: Sutures harvested today.  Recommended a compression sock.  Continue nonweightbearing and elevation.  Night sweats may be from her tramadol.  Recommended trying Tylenoll.  Anticipate out of work 2 to 3 months.   Three-view radiographs of the right foot at follow-up.  Follow-Up Instructions: Return in about 2 weeks (around 11/07/2022).   Ortho Exam  Patient is alert, oriented, no adenopathy, well-dressed, normal affect, normal respiratory effort. Examination the incisions are well-healed there is no redness no cellulitis no signs of infection there is no drainage.  Sutures harvested today.  Imaging: No results found. No images are attached to the encounter.  Labs: Lab Results  Component Value Date   HGBA1C 5.6 03/12/2021   HGBA1C 5.8 (H) 08/02/2020   HGBA1C 5.9 (H) 01/19/2020   ESRSEDRATE 29 05/19/2019   LABURIC 4.3 12/03/2021     Lab Results  Component Value Date   ALBUMIN 4.1 08/02/2020   ALBUMIN 4.0 01/19/2020   ALBUMIN 2.5 (L) 08/23/2019    Lab Results  Component Value Date   MG 1.9 08/23/2019   Lab Results  Component Value Date   VD25OH 42 09/12/2022   VD25OH 37 03/12/2021   VD25OH 90.3 08/02/2020    No results found for:  "PREALBUMIN"    Latest Ref Rng & Units 09/12/2022    9:43 AM 12/03/2021    3:44 PM 09/17/2021   10:12 AM  CBC EXTENDED  WBC 3.8 - 10.8 Thousand/uL 6.3  7.2  5.9   RBC 3.80 - 5.10 Million/uL 4.05  3.97  3.89   Hemoglobin 11.7 - 15.5 g/dL 84.1  66.0  63.0   HCT 35.0 - 45.0 % 38.4  37.7  37.6   Platelets 140 - 400 Thousand/uL 266  261  264   NEUT# 1,500 - 7,800 cells/uL 3,219  3,722  2,938   Lymph# 850 - 3,900 cells/uL 2,463  2,714  2,407      There is no height or weight on file to calculate BMI.  Orders:  No orders of the defined types were placed in this encounter.  No orders of the defined types were placed in this encounter.    Procedures: No procedures performed  Clinical Data: No additional findings.  ROS:  All other systems negative, except as noted in the HPI. Review of Systems  Objective: Vital Signs: There were no vitals taken for this visit.  Specialty Comments:  No specialty comments available.  PMFS History: Patient Active Problem List   Diagnosis Date Noted   Posterior tibial tendon dysfunction, right 10/09/2022   Primary  osteoarthritis of left hand 09/16/2022   Arthritis of carpometacarpal [facility]) joint of left thumb 10/09/2021   History of GI diverticular bleed 09/16/2019   Acute lower GI bleeding 08/22/2019   Orthostatic syncope 08/22/2019   Osteopenia 09/16/2018   Essential hypertension 02/19/2017   Mixed hyperlipidemia 02/19/2017   Depression, major, single episode, complete remission (HCC) 02/19/2017   Herpes genitalis in women 02/19/2017   Past Medical History:  Diagnosis Date   Anemia    Anxiety    Arthritis    Asthma    Depression    Diverticulosis    noted on colonscopy    Elevated cholesterol    GERD (gastroesophageal reflux disease)    H/O mammogram    2015, 2016   Hepatitis    Hep B in 1980s   Herpes    Not gential   High blood pressure    History of esophagogastroduodenoscopy (EGD)    2014, 2016   History of PFTs  10/26/2012   Lower GI bleed 01/19/2015   Related to mobic 15 mg    Mitral valve prolapse 09/06/1996   Osteoarthritis of both knees    Pneumonia     Family History  Problem Relation Age of Onset   Heart attack Mother    CVA Mother    Diabetes Mother    Congestive Heart Failure Mother    Stroke Mother    Heart disease Mother    Hyperlipidemia Mother    Hypertension Mother    Depression Mother    Obesity Mother    Lung cancer Sister 102   Stomach cancer Brother    Neurologic Disorder Son    Colon cancer Niece    Diabetes Maternal Grandmother     Past Surgical History:  Procedure Laterality Date   BREAST EXCISIONAL BIOPSY Left    benign   BREAST SURGERY  05/20/2007   Biospy, Dr.William Purkert    COLONOSCOPY     2014, 2016 Dr.Bajani Manalo, Dr.Mahmood Abedi    FOOT ARTHRODESIS Right 10/09/2022   Procedure: RIGHT TALONAVICULAR AND SUBTALAR FUSION;  Surgeon: Nadara Mustard, MD;  Location: [facility] OR;  Service: Orthopedics;  Laterality: Right;   KNEE ARTHROSCOPY Right    Social History   Occupational History   Occupation: Charity fundraiser  Tobacco Use   Smoking status: Former    Packs/day: 0.25    Years: 10.00    Additional pack years: 0.00    Total pack years: 2.50    Types: Cigarettes    Quit date: 09/16/1986    Years since quitting: 36.1   Smokeless tobacco: Never   Tobacco comments:    Quit in her 8's   Vaping Use   Vaping Use: Never used  Substance and Sexual Activity   Alcohol use: Yes    Comment: Occasionally red wine    Drug use: No   Sexual activity: Not Currently

## 2022-11-05 ENCOUNTER — Other Ambulatory Visit (INDEPENDENT_AMBULATORY_CARE_PROVIDER_SITE_OTHER): Payer: Commercial Managed Care - PPO

## 2022-11-05 ENCOUNTER — Encounter: Payer: Self-pay | Admitting: Family

## 2022-11-05 ENCOUNTER — Ambulatory Visit (INDEPENDENT_AMBULATORY_CARE_PROVIDER_SITE_OTHER): Payer: Commercial Managed Care - PPO | Admitting: Family

## 2022-11-05 DIAGNOSIS — Z981 Arthrodesis status: Secondary | ICD-10-CM | POA: Diagnosis not present

## 2022-11-05 NOTE — Progress Notes (Signed)
Post-Op Visit Note   Patient: Alejandra Crane           Date of Birth: 09/02/1950           MRN: [id number] Visit Date: 11/05/2022 PCP: [name], NP  Chief Complaint:  Chief Complaint  Patient presents with   Right Ankle - Routine Post Op    10/09/22 right talonavicular subtalar fusion    HPI:  HPI The patient is a 72 year old woman who is seen status post right talonavicular and subtalar fusion on June 5 she has been wearing her cam walker she did stop taking the tramadol for pain now just using the Tylenol.  She is using her medical compression stockings, the IV she feels these are assisting with the swelling she is pleased with her improvement in pain she has been walking with the crutches it appears as though she has been weightbearing with the crutches Ortho Exam On examination of the right foot and ankle all incisions are well-healed she does have moderate edema about the foot and ankle no erythema or warmth no dystrophic changes  Visit Diagnoses:  1. S/P ankle fusion     Plan: May advance to full weightbearing in her cam walker as pain permits discussed if she is having pain she will back off and nonweightbearing use crutches.  Plan to follow-up in 2 to 3 weeks  Follow-Up Instructions: Return in about 16 days (around 11/21/2022).   Imaging: No results found.  Orders:  Orders Placed This Encounter  Procedures   XR Foot Complete Right   No orders of the defined types were placed in this encounter.    PMFS History: Patient Active Problem List   Diagnosis Date Noted   Posterior tibial tendon dysfunction, right 10/09/2022   Primary osteoarthritis of left hand 09/16/2022   Arthritis of carpometacarpal [facility]) joint of left thumb 10/09/2021   History of GI diverticular bleed 09/16/2019   Acute lower GI bleeding 08/22/2019   Orthostatic syncope 08/22/2019   Osteopenia 09/16/2018   Essential hypertension 02/19/2017   Mixed hyperlipidemia 02/19/2017    Depression, major, single episode, complete remission (HCC) 02/19/2017   Herpes genitalis in women 02/19/2017   Past Medical History:  Diagnosis Date   Anemia    Anxiety    Arthritis    Asthma    Depression    Diverticulosis    noted on colonscopy    Elevated cholesterol    GERD (gastroesophageal reflux disease)    H/O mammogram    2015, 2016   Hepatitis    Hep B in 1980s   Herpes    Not gential   High blood pressure    History of esophagogastroduodenoscopy (EGD)    2014, 2016   History of PFTs 10/26/2012   Lower GI bleed 01/19/2015   Related to mobic 15 mg    Mitral valve prolapse 09/06/1996   Osteoarthritis of both knees    Pneumonia     Family History  Problem Relation Age of Onset   Heart attack Mother    CVA Mother    Diabetes Mother    Congestive Heart Failure Mother    Stroke Mother    Heart disease Mother    Hyperlipidemia Mother    Hypertension Mother    Depression Mother    Obesity Mother    Lung cancer Sister 2   Stomach cancer Brother    Neurologic Disorder Son    Colon cancer Niece    Diabetes Maternal Grandmother  Past Surgical History:  Procedure Laterality Date   BREAST EXCISIONAL BIOPSY Left    benign   BREAST SURGERY  05/20/2007   Biospy, Dr.William Purkert    COLONOSCOPY     2014, 2016 Dr.Bajani Manalo, Dr.Mahmood Abedi    FOOT ARTHRODESIS Right 10/09/2022   Procedure: RIGHT TALONAVICULAR AND SUBTALAR FUSION;  Surgeon: Nadara Mustard, MD;  Location[facility] OR;  Service: Orthopedics;  Laterality: Right;   KNEE ARTHROSCOPY Right    Social History   Occupational History   Occupation: Charity fundraiser  Tobacco Use   Smoking status: Former    Packs/day: 0.25    Years: 10.00    Additional pack years: 0.00    Total pack years: 2.50    Types: Cigarettes    Quit date: 09/16/1986    Years since quitting: 36.1   Smokeless tobacco: Never   Tobacco comments:    Quit in her 9's   Vaping Use   Vaping Use: Never used  Substance and Sexual Activity    Alcohol use: Yes    Comment: Occasionally red wine    Drug use: No   Sexual activity: Not Currently

## 2022-11-28 ENCOUNTER — Encounter: Payer: Self-pay | Admitting: Orthopedic Surgery

## 2022-11-28 ENCOUNTER — Ambulatory Visit (INDEPENDENT_AMBULATORY_CARE_PROVIDER_SITE_OTHER): Payer: Commercial Managed Care - PPO | Admitting: Orthopedic Surgery

## 2022-11-28 DIAGNOSIS — Z981 Arthrodesis status: Secondary | ICD-10-CM

## 2022-11-28 NOTE — Progress Notes (Signed)
Office Visit Note   Patient: Alejandra Crane           Date of Birth: 1951/03/19           MRN: [id number] Visit Date: 11/28/2022              Requested by: [name], NP [address] [place]. [place],  [place] [address] PCP: [name], NP  Chief Complaint  Patient presents with   Right Ankle - Routine Post Op    10/09/22 right talonavicular subtalar fusion      HPI: Patient is a 72 year old woman status post talonavicular and subtalar fusion.  She is currently ambulating in a fracture boot.  She states she is feeling better but still has a little bit of drainage from the lateral incision.  Assessment & Plan: Visit Diagnoses:  1. S/P ankle fusion     Plan: Recommend she continue wear the compression socks continue with her routine wound care.  She will call if her symptoms worsen but she is currently feeling better.  Plan for three-view radiographs of the right foot at follow-up.  Follow-Up Instructions: Return in about 4 weeks (around 12/26/2022).   Ortho Exam  Patient is alert, oriented, no adenopathy, well-dressed, normal affect, normal respiratory effort. Examination the dorsal incision is well-healed the lateral incision shows a small amount of serous drainage.  There is a little bit of tenderness to palpation there is no redness no swelling no clinical signs of infection.  Patient states symptomatically she is getting better.  Imaging: No results found. No images are attached to the encounter.  Labs: Lab Results  Component Value Date   HGBA1C 5.6 03/12/2021   HGBA1C 5.8 (H) 08/02/2020   HGBA1C 5.9 (H) 01/19/2020   ESRSEDRATE 29 05/19/2019   LABURIC 4.3 12/03/2021     Lab Results  Component Value Date   ALBUMIN 4.1 08/02/2020   ALBUMIN 4.0 01/19/2020   ALBUMIN 2.5 (L) 08/23/2019    Lab Results  Component Value Date   MG 1.9 08/23/2019   Lab Results  Component Value Date   VD25OH 42 09/12/2022   VD25OH 37 03/12/2021   VD25OH 90.3  08/02/2020    No results found for: "PREALBUMIN"    Latest Ref Rng & Units 09/12/2022    9:43 AM 12/03/2021    3:44 PM 09/17/2021   10:12 AM  CBC EXTENDED  WBC 3.8 - 10.8 Thousand/uL 6.3  7.2  5.9   RBC 3.80 - 5.10 Million/uL 4.05  3.97  3.89   Hemoglobin 11.7 - 15.5 g/dL 69.6  29.5  28.4   HCT 35.0 - 45.0 % 38.4  37.7  37.6   Platelets 140 - 400 Thousand/uL 266  261  264   NEUT# 1,500 - 7,800 cells/uL 3,219  3,722  2,938   Lymph# 850 - 3,900 cells/uL 2,463  2,714  2,407      There is no height or weight on file to calculate BMI.  Orders:  No orders of the defined types were placed in this encounter.  No orders of the defined types were placed in this encounter.    Procedures: No procedures performed  Clinical Data: No additional findings.  ROS:  All other systems negative, except as noted in the HPI. Review of Systems  Objective: Vital Signs: There were no vitals taken for this visit.  Specialty Comments:  No specialty comments available.  PMFS History: Patient Active Problem List   Diagnosis Date Noted   Posterior  tibial tendon dysfunction, right 10/09/2022   Primary osteoarthritis of left hand 09/16/2022   Arthritis of carpometacarpal [facility]) joint of left thumb 10/09/2021   History of GI diverticular bleed 09/16/2019   Acute lower GI bleeding 08/22/2019   Orthostatic syncope 08/22/2019   Osteopenia 09/16/2018   Essential hypertension 02/19/2017   Mixed hyperlipidemia 02/19/2017   Depression, major, single episode, complete remission (HCC) 02/19/2017   Herpes genitalis in women 02/19/2017   Past Medical History:  Diagnosis Date   Anemia    Anxiety    Arthritis    Asthma    Depression    Diverticulosis    noted on colonscopy    Elevated cholesterol    GERD (gastroesophageal reflux disease)    H/O mammogram    2015, 2016   Hepatitis    Hep B in 1980s   Herpes    Not gential   High blood pressure    History of esophagogastroduodenoscopy (EGD)     2014, 2016   History of PFTs 10/26/2012   Lower GI bleed 01/19/2015   Related to mobic 15 mg    Mitral valve prolapse 09/06/1996   Osteoarthritis of both knees    Pneumonia     Family History  Problem Relation Age of Onset   Heart attack Mother    CVA Mother    Diabetes Mother    Congestive Heart Failure Mother    Stroke Mother    Heart disease Mother    Hyperlipidemia Mother    Hypertension Mother    Depression Mother    Obesity Mother    Lung cancer Sister 1   Stomach cancer Brother    Neurologic Disorder Son    Colon cancer Niece    Diabetes Maternal Grandmother     Past Surgical History:  Procedure Laterality Date   BREAST EXCISIONAL BIOPSY Left    benign   BREAST SURGERY  05/20/2007   Biospy, Dr.William Purkert    COLONOSCOPY     2014, 2016 Dr.Bajani Manalo, Dr.Mahmood Abedi    FOOT ARTHRODESIS Right 10/09/2022   Procedure: RIGHT TALONAVICULAR AND SUBTALAR FUSION;  Surgeon: Nadara Mustard, MD;  Location: [facility] OR;  Service: Orthopedics;  Laterality: Right;   KNEE ARTHROSCOPY Right    Social History   Occupational History   Occupation: RN  Tobacco Use   Smoking status: Former    Current packs/day: 0.00    Average packs/day: 0.3 packs/day for 10.0 years (2.5 ttl pk-yrs)    Types: Cigarettes    Start date: 09/15/1976    Quit date: 09/16/1986    Years since quitting: 36.2   Smokeless tobacco: Never   Tobacco comments:    Quit in her 60's   Vaping Use   Vaping status: Never Used  Substance and Sexual Activity   Alcohol use: Yes    Comment: Occasionally red wine    Drug use: No   Sexual activity: Not Currently

## 2022-12-06 ENCOUNTER — Other Ambulatory Visit: Payer: Self-pay

## 2022-12-06 ENCOUNTER — Other Ambulatory Visit: Payer: Self-pay | Admitting: Nurse Practitioner

## 2022-12-06 ENCOUNTER — Other Ambulatory Visit (HOSPITAL_COMMUNITY): Payer: Self-pay

## 2022-12-06 DIAGNOSIS — A6009 Herpesviral infection of other urogenital tract: Secondary | ICD-10-CM

## 2022-12-06 MED ORDER — ATOVAQUONE-PROGUANIL HCL 250-100 MG PO TABS
ORAL_TABLET | ORAL | 0 refills | Status: DC
  Filled 2022-12-06: qty 53, 53d supply, fill #0

## 2022-12-06 MED ORDER — AZITHROMYCIN 500 MG PO TABS
ORAL_TABLET | ORAL | 0 refills | Status: DC
  Filled 2022-12-06: qty 4, 3d supply, fill #0

## 2022-12-06 MED ORDER — VALACYCLOVIR HCL 500 MG PO TABS
500.0000 mg | ORAL_TABLET | Freq: Every day | ORAL | 1 refills | Status: DC
Start: 2022-12-06 — End: 2023-03-17
  Filled 2022-12-06: qty 90, 90d supply, fill #0

## 2022-12-09 ENCOUNTER — Other Ambulatory Visit (HOSPITAL_COMMUNITY): Payer: Self-pay

## 2022-12-26 ENCOUNTER — Ambulatory Visit: Payer: Commercial Managed Care - PPO | Admitting: Orthopedic Surgery

## 2022-12-26 ENCOUNTER — Other Ambulatory Visit (INDEPENDENT_AMBULATORY_CARE_PROVIDER_SITE_OTHER): Payer: Commercial Managed Care - PPO

## 2022-12-26 ENCOUNTER — Encounter: Payer: Self-pay | Admitting: Orthopedic Surgery

## 2022-12-26 DIAGNOSIS — M79671 Pain in right foot: Secondary | ICD-10-CM

## 2022-12-26 DIAGNOSIS — M76821 Posterior tibial tendinitis, right leg: Secondary | ICD-10-CM

## 2022-12-26 NOTE — Progress Notes (Signed)
Patient is 2 and half months status post right talonavicular and subtalar fusion.  She is currently full weightbearing in a short fracture boot and a [place] sock.  Patient states she does not take any medications has a little discomfort.  Examination she still has some swelling the incisions are well-healed no keloiding.  She will continue with scar massage.  Recommended hiking shoes for her trip to [place].  Patient will follow-up with [place] after her return from [place].  Obtain three-view radiographs of the right foot at follow-up.

## 2023-01-02 IMAGING — MG MM DIGITAL SCREENING BILAT W/ TOMO AND CAD
8 series · 8 of 24 positions shown · non-contrast
Comparison: Previous exam(s).

CLINICAL DATA: Screening.

EXAM:
DIGITAL SCREENING BILATERAL MAMMOGRAM WITH TOMOSYNTHESIS AND CAD
TECHNIQUE: Bilateral screening digital craniocaudal and mediolateral oblique
mammograms were obtained. Bilateral screening digital breast
tomosynthesis was performed. The images were evaluated with
computer-aided detection.

[R MLO synth-2D]
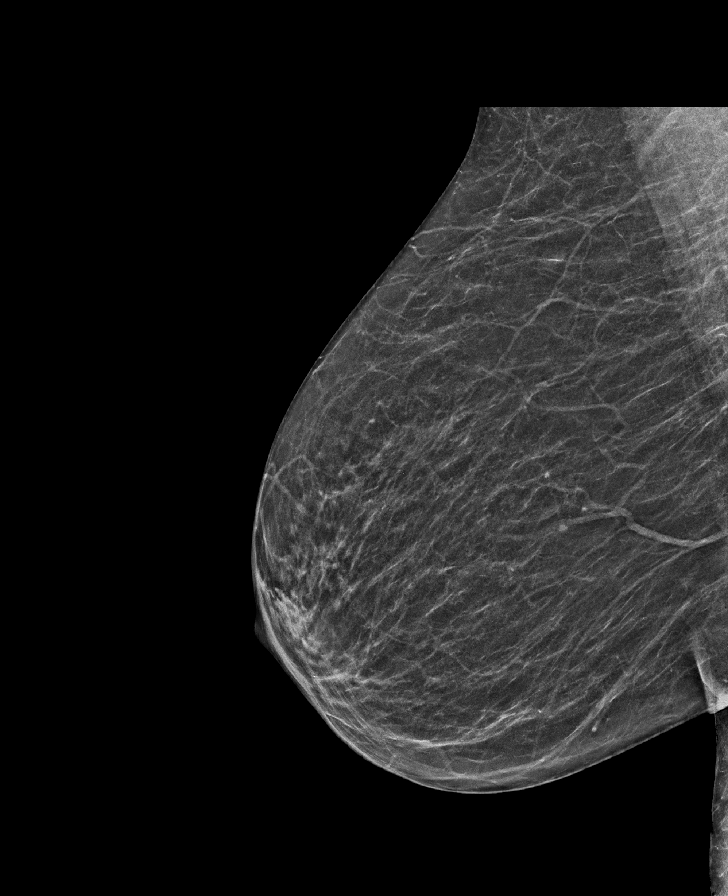

[R CC synth-2D]
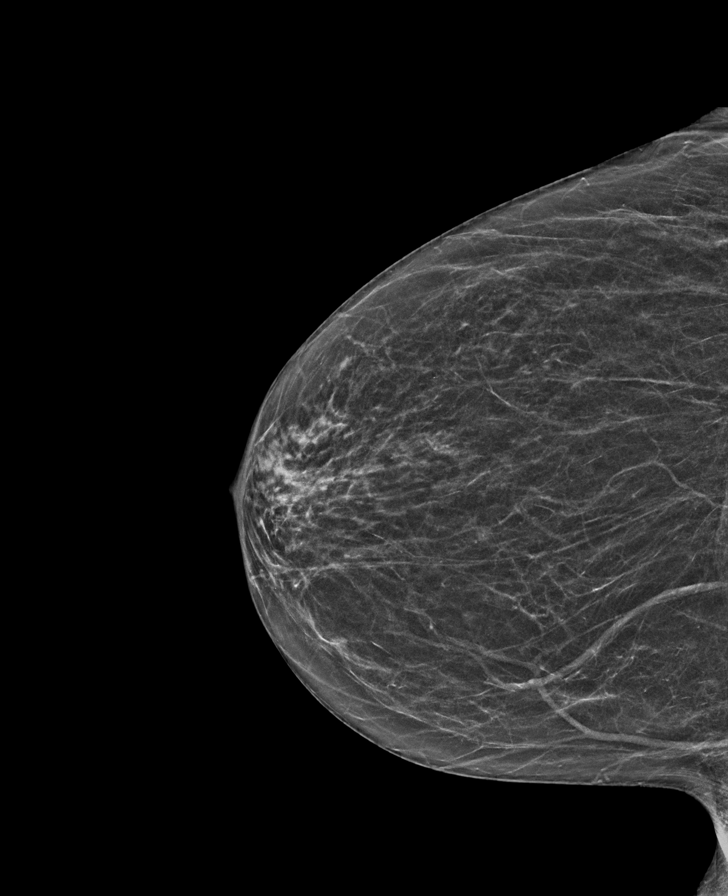

[L MLO synth-2D]
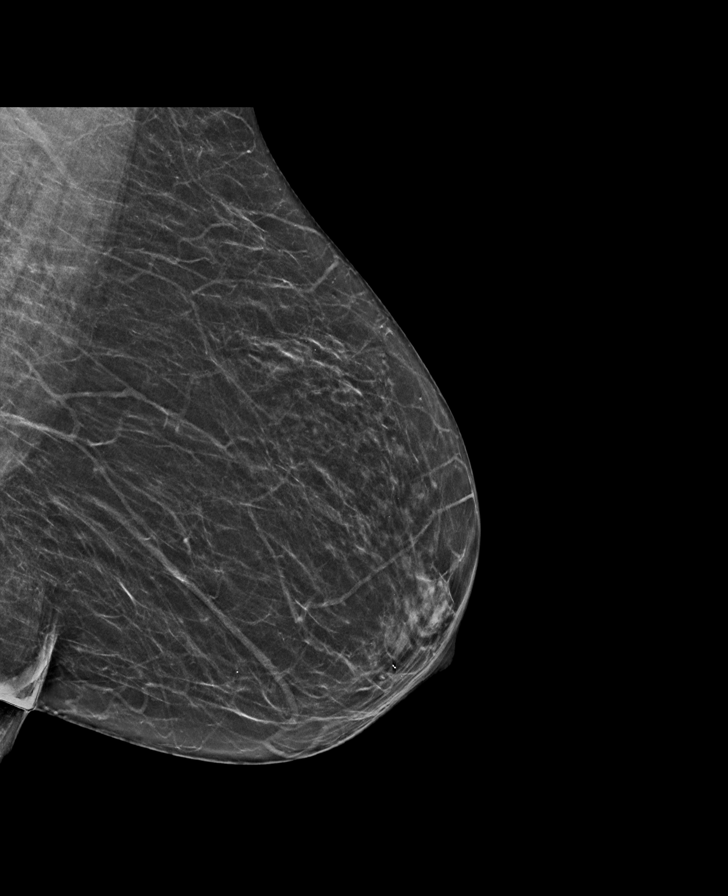

[L CC synth-2D]
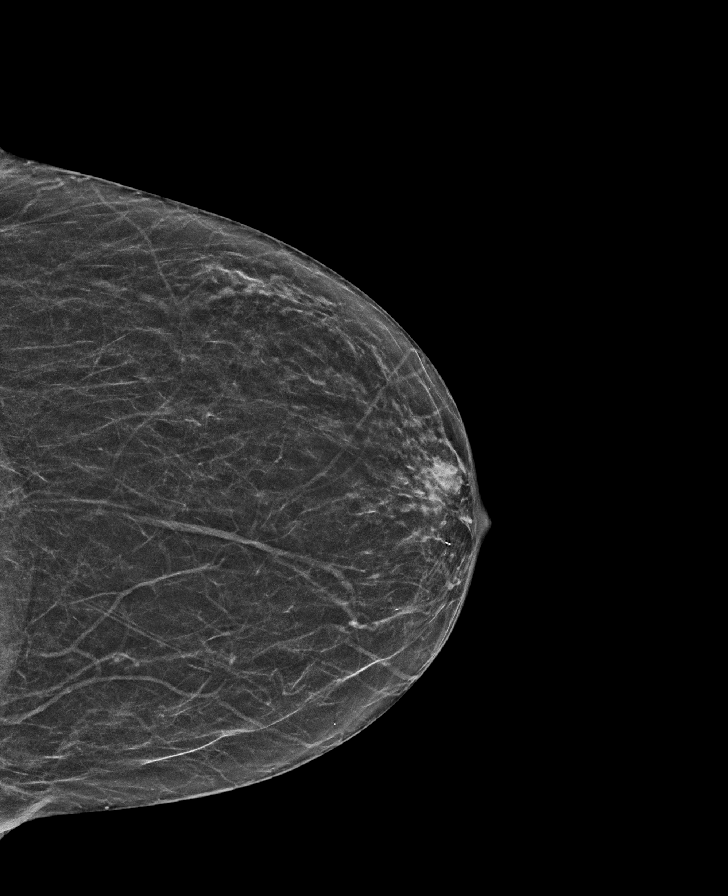

[L MLO tomo · tomo slice 27/54.0]
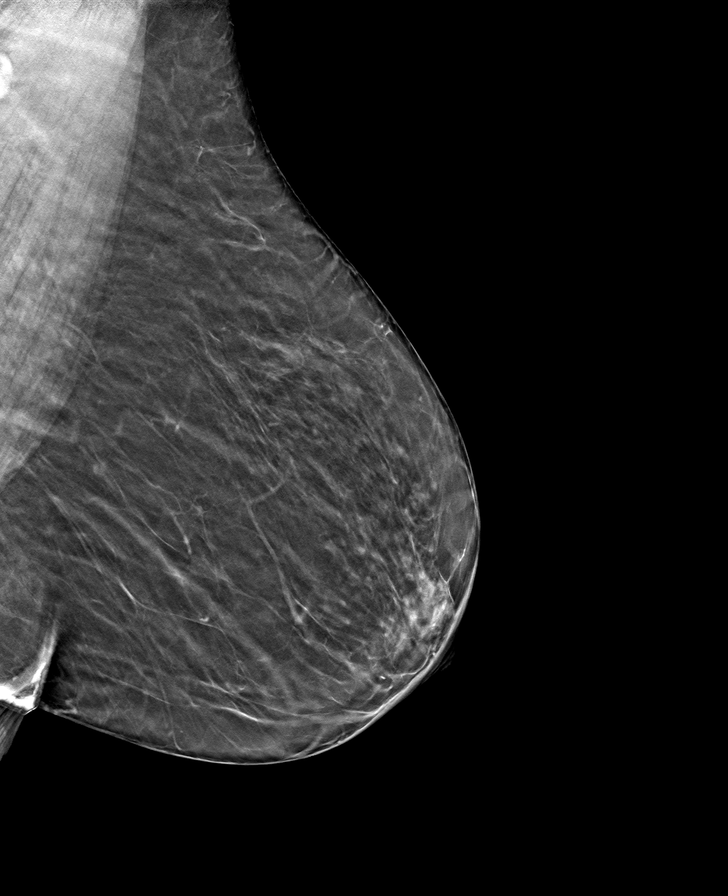

[R MLO tomo · tomo slice 27/52.0]
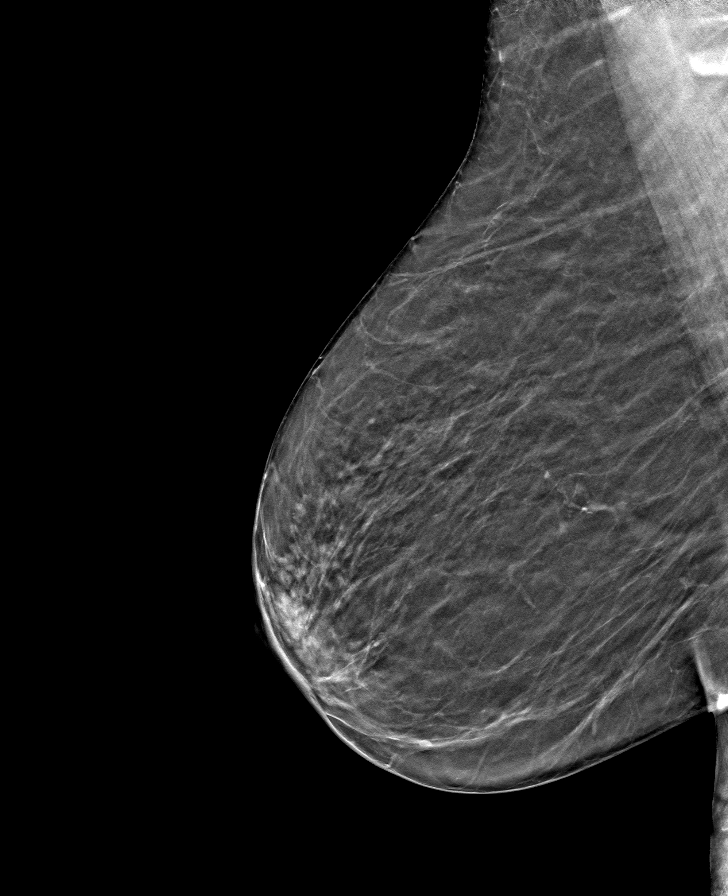

[R CC tomo · tomo slice 26/51.0]
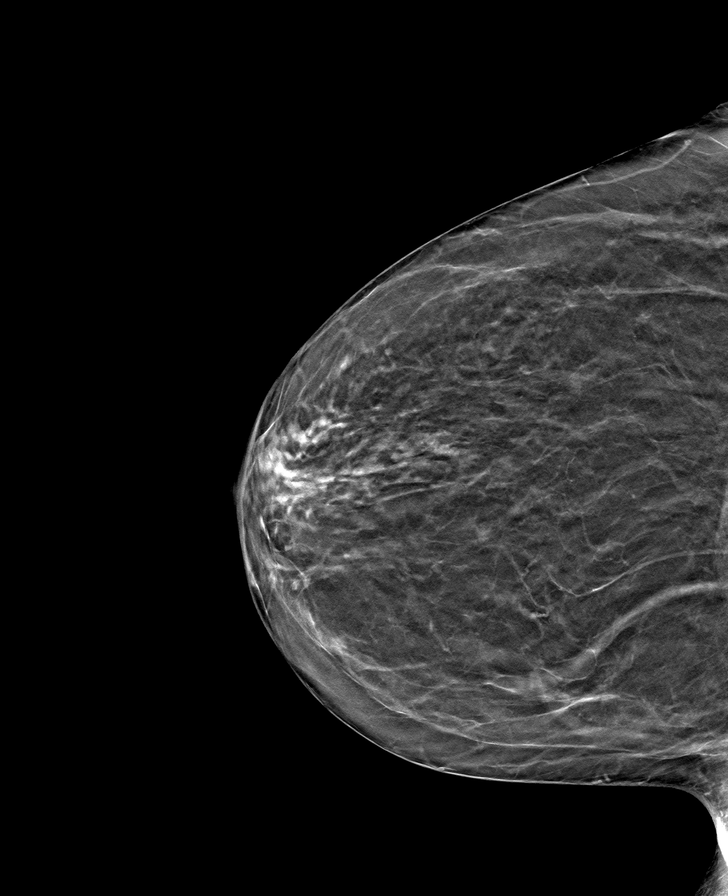

[L CC tomo · tomo slice 25/48.0]
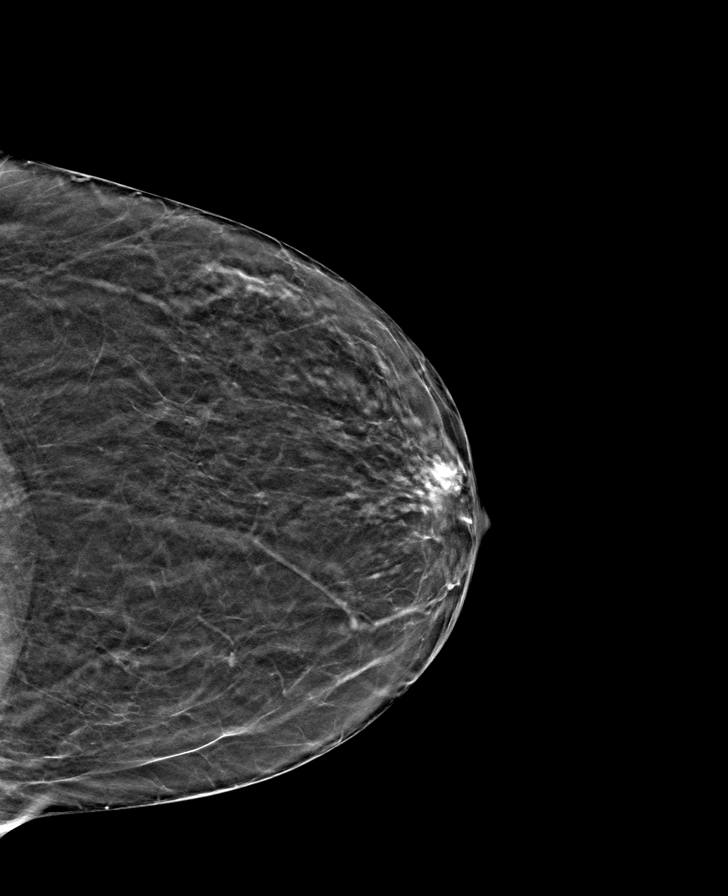

[8 of 24 positions shown; findings below may reference images not displayed]

ACR Breast Density Category b: There are scattered areas of
fibroglandular density.
FINDINGS: There are no findings suspicious for malignancy.
IMPRESSION: No mammographic evidence of malignancy. A result letter of this
screening mammogram will be mailed directly to the patient.

RECOMMENDATION:
Screening mammogram in one year. (Code:51-O-LD2)

BI-RADS CATEGORY  1: Negative.

## 2023-01-03 DIAGNOSIS — Z23 Encounter for immunization: Secondary | ICD-10-CM | POA: Diagnosis not present

## 2023-03-13 ENCOUNTER — Other Ambulatory Visit (INDEPENDENT_AMBULATORY_CARE_PROVIDER_SITE_OTHER): Payer: Commercial Managed Care - PPO

## 2023-03-13 ENCOUNTER — Ambulatory Visit: Payer: Commercial Managed Care - PPO | Admitting: Orthopedic Surgery

## 2023-03-13 DIAGNOSIS — Z981 Arthrodesis status: Secondary | ICD-10-CM

## 2023-03-17 ENCOUNTER — Encounter: Payer: Self-pay | Admitting: Orthopedic Surgery

## 2023-03-17 ENCOUNTER — Encounter: Payer: Self-pay | Admitting: Nurse Practitioner

## 2023-03-17 ENCOUNTER — Ambulatory Visit (INDEPENDENT_AMBULATORY_CARE_PROVIDER_SITE_OTHER): Payer: Commercial Managed Care - PPO | Admitting: Nurse Practitioner

## 2023-03-17 ENCOUNTER — Other Ambulatory Visit (HOSPITAL_COMMUNITY): Payer: Self-pay

## 2023-03-17 VITALS — BP 122/80 | HR 60 | Temp 97.1°F | Ht 62.5 in | Wt 181.8 lb

## 2023-03-17 DIAGNOSIS — Z6832 Body mass index (BMI) 32.0-32.9, adult: Secondary | ICD-10-CM

## 2023-03-17 DIAGNOSIS — E6609 Other obesity due to excess calories: Secondary | ICD-10-CM | POA: Diagnosis not present

## 2023-03-17 DIAGNOSIS — M150 Primary generalized (osteo)arthritis: Secondary | ICD-10-CM | POA: Diagnosis not present

## 2023-03-17 DIAGNOSIS — F325 Major depressive disorder, single episode, in full remission: Secondary | ICD-10-CM | POA: Diagnosis not present

## 2023-03-17 DIAGNOSIS — A6009 Herpesviral infection of other urogenital tract: Secondary | ICD-10-CM | POA: Diagnosis not present

## 2023-03-17 DIAGNOSIS — I10 Essential (primary) hypertension: Secondary | ICD-10-CM | POA: Diagnosis not present

## 2023-03-17 DIAGNOSIS — D649 Anemia, unspecified: Secondary | ICD-10-CM

## 2023-03-17 DIAGNOSIS — E782 Mixed hyperlipidemia: Secondary | ICD-10-CM | POA: Diagnosis not present

## 2023-03-17 DIAGNOSIS — E559 Vitamin D deficiency, unspecified: Secondary | ICD-10-CM | POA: Diagnosis not present

## 2023-03-17 DIAGNOSIS — E66811 Obesity, class 1: Secondary | ICD-10-CM | POA: Diagnosis not present

## 2023-03-17 MED ORDER — VALACYCLOVIR HCL 500 MG PO TABS
500.0000 mg | ORAL_TABLET | Freq: Every day | ORAL | 1 refills | Status: DC
Start: 2023-03-17 — End: 2023-09-08
  Filled 2023-03-17: qty 90, 90d supply, fill #0

## 2023-03-17 MED ORDER — ROSUVASTATIN CALCIUM 10 MG PO TABS
10.0000 mg | ORAL_TABLET | Freq: Every day | ORAL | 3 refills | Status: DC
  Filled 2023-03-17: qty 90, 90d supply, fill #0

## 2023-03-17 MED ORDER — SERTRALINE HCL 100 MG PO TABS
100.0000 mg | ORAL_TABLET | Freq: Every day | ORAL | 1 refills | Status: DC
  Filled 2023-03-17: qty 90, 90d supply, fill #0

## 2023-03-17 MED ORDER — HYDROCHLOROTHIAZIDE 25 MG PO TABS
25.0000 mg | ORAL_TABLET | Freq: Every day | ORAL | 1 refills | Status: DC
  Filled 2023-03-17: qty 90, 90d supply, fill #0

## 2023-03-17 NOTE — Progress Notes (Signed)
Careteam: Patient Care Team: [name], NP as PCP - General (Geriatric Medicine) Drema Dallas, DO as Consulting Physician (Neurology)  PLACE OF SERVICE:  [facility]  Advanced Directive information Does Patient Have a Medical Advance Directive?: Yes, Type of Advance Directive: Healthcare Power of Richards;Living will, Does patient want to make changes to medical advance directive?: No - Patient declined  Allergies  Allergen Reactions   Mobic [Meloxicam]     GI bleed    Chief Complaint  Patient presents with   Medical Management of Chronic Issues    6 month follow-up. All vaccines up to date. Discuss aging.      HPI: Patient is a 72 y.o. female for routine follow up  Reports she is aging.  Her skin in changing. Scaly dry- using cerve and vaseline.   Bowel habits are changing- if she eats she has to have a BM- it is regular.  More symptoms after she eat- more discomfort in her stomach. She will have a BM and its improve.   Anxiety and depression- more anxiety when thinking about aging.  Birthdays coming too fast, years pasting fasting.  Thinking more about the aging process.   Overall she feels well.   Went to [place] on a quilting trip. Reports it was the best trip of her life.  Did a lot of sight seeing.  15 with tourist group and 15 days with her cousin who lives there.  They did not have the water system.  Had to cut the trip short because the malaria medications were giving her severe diarrhea.  Review of Systems:  Review of Systems  Constitutional:  Negative for chills, fever and weight loss.  HENT:  Negative for tinnitus.   Respiratory:  Negative for cough, sputum production and shortness of breath.   Cardiovascular:  Negative for chest pain, palpitations and leg swelling.  Gastrointestinal:  Negative for abdominal pain, constipation, diarrhea and heartburn.  Genitourinary:  Negative for dysuria, frequency and urgency.  Musculoskeletal:  Negative  for back pain, falls, joint pain and myalgias.  Skin: Negative.   Neurological:  Negative for dizziness and headaches.  Psychiatric/Behavioral:  Negative for depression and memory loss. The patient does not have insomnia.     Past Medical History:  Diagnosis Date   Anemia    Anxiety    Arthritis    Asthma    Depression    Diverticulosis    noted on colonscopy    Elevated cholesterol    GERD (gastroesophageal reflux disease)    H/O mammogram    2015, 2016   Hepatitis    Hep B in 1980s   Herpes    Not gential   High blood pressure    History of esophagogastroduodenoscopy (EGD)    2014, 2016   History of PFTs 10/26/2012   Lower GI bleed 01/19/2015   Related to mobic 15 mg    Mitral valve prolapse 09/06/1996   Osteoarthritis of both knees    Pneumonia    Past Surgical History:  Procedure Laterality Date   BREAST EXCISIONAL BIOPSY Left    benign   BREAST SURGERY  05/20/2007   [facility], Dr.William Purkert    COLONOSCOPY     2014, 2016 Dr.Bajani Manalo, Dr.Mahmood Abedi    FOOT ARTHRODESIS Right 10/09/2022   Procedure: RIGHT TALONAVICULAR AND SUBTALAR FUSION;  Surgeon: Nadara Mustard, MD;  Location: [facility] OR;  Service: Orthopedics;  Laterality: Right;   KNEE ARTHROSCOPY Right    Social  History:   reports that she quit smoking about 36 years ago. Her smoking use included cigarettes. She started smoking about 46 years ago. She has a 2.5 pack-year smoking history. She has never used smokeless tobacco. She reports current alcohol use. She reports that she does not use drugs.  Family History  Problem Relation Age of Onset   Heart attack Mother    CVA Mother    Diabetes Mother    Congestive Heart Failure Mother    Stroke Mother    Heart disease Mother    Hyperlipidemia Mother    Hypertension Mother    Depression Mother    Obesity Mother    Lung cancer Sister 26   Stomach cancer Brother    Neurologic Disorder Son    Colon cancer Niece    Diabetes Maternal Grandmother      Medications: Patient's Medications  New Prescriptions   No medications on file  Previous Medications   CALCIUM CARBONATE (CALCIUM 600) 600 MG TABS TABLET    Take 1 tablet (600 mg total) by mouth 2 (two) times daily with a meal.   CHOLECALCIFEROL (VITAMIN D3) 50 MCG (2000 UT) CAPSULE    Take 1 capsule (2,000 Units total) by mouth daily.   HYDROCHLOROTHIAZIDE (HYDRODIURIL) 25 MG TABLET    Take 1 tablet (25 mg total) by mouth daily.   ROSUVASTATIN (CRESTOR) 10 MG TABLET    Take 1 tablet (10 mg total) by mouth daily.   SERTRALINE (ZOLOFT) 100 MG TABLET    Take 1 tablet (100 mg total) by mouth daily.   VALACYCLOVIR (VALTREX) 500 MG TABLET    Take 1 tablet (500 mg total) by mouth daily.  Modified Medications   No medications on file  Discontinued Medications   ATOVAQUONE-PROGUANIL (MALARONE) 250-100 MG TABS TABLET    Take 1 tablet by mouth once daily. Begin 2 days before arrival, take daily during stay & continue for 7 days post travel for malaria prevention.   AZITHROMYCIN (ZITHROMAX) 500 MG TABLET    For non-bloody diarrhea, take 2 tablets by mouth on day 1. If resolved, stop medication. If diarrhea persists, take 1 tablet on day 2 and 3. For bloody diarrhea, take 2 tablets by mouth on day 1, then take 1 tablet day 2 and 3.   TRAMADOL (ULTRAM) 50 MG TABLET    Take 1 tablet (50 mg total) by mouth every 6 (six) hours as needed.    Physical Exam:  Vitals:   03/17/23 0843  BP: 122/80  Pulse: 60  Temp: (!) 97.1 F (36.2 C)  TempSrc: Temporal  SpO2: 98%  Weight: 181 lb 12.8 oz (82.5 kg)  Height: 5' 2.5" (1.588 m)   Body mass index is 32.72 kg/m. Wt Readings from Last 3 Encounters:  03/17/23 181 lb 12.8 oz (82.5 kg)  10/09/22 180 lb (81.6 kg)  09/16/22 182 lb 9.6 oz (82.8 kg)    Physical Exam Constitutional:      General: She is not in acute distress.    Appearance: She is well-developed. She is not diaphoretic.  HENT:     Head: Normocephalic and atraumatic.     Mouth/Throat:      Pharynx: No oropharyngeal exudate.  Eyes:     Conjunctiva/sclera: Conjunctivae normal.     Pupils: Pupils are equal, round, and reactive to light.  Cardiovascular:     Rate and Rhythm: Normal rate and regular rhythm.     Heart sounds: Normal heart sounds.  Pulmonary:  Effort: Pulmonary effort is normal.     Breath sounds: Normal breath sounds.  Abdominal:     General: Bowel sounds are normal.     Palpations: Abdomen is soft.  Musculoskeletal:     Cervical back: Normal range of motion and neck supple.     Right lower leg: No edema.     Left lower leg: No edema.  Skin:    General: Skin is warm and dry.  Neurological:     Mental Status: She is alert.  Psychiatric:        Mood and Affect: Mood normal.     Labs reviewed: Basic Metabolic Panel: Recent Labs    09/12/22 0943  NA 142  K 4.0  CL 106  CO2 26  GLUCOSE 86  BUN 28*  CREATININE 0.87  CALCIUM 9.8   Liver Function Tests: Recent Labs    09/12/22 0943  AST 17  ALT 9  BILITOT 0.5  PROT 7.0   No results for input(s): "LIPASE", "AMYLASE" in the last 8760 hours. No results for input(s): "AMMONIA" in the last 8760 hours. CBC: Recent Labs    09/12/22 0943  WBC 6.3  NEUTROABS 3,219  HGB 12.6  HCT 38.4  MCV 94.8  PLT 266   Lipid Panel: Recent Labs    09/12/22 0943  CHOL 215*  HDL 71  LDLCALC 130*  TRIG 53  CHOLHDL 3.0   TSH: No results for input(s): "TSH" in the last 8760 hours. A1C: Lab Results  Component Value Date   HGBA1C 5.6 03/12/2021     Assessment/Plan 1. Primary osteoarthritis involving multiple joints -stable at this time, without any increase in pain.   2. Vitamin D deficiency -continues on supplement  3. Mixed hyperlipidemia Continue dietary modifications with crestor 10 mg daily  - Lipid panel  4. Essential hypertension -Blood pressure well controlled, goal bp <140/90 Continue current medications and dietary modifications follow metabolic panel - COMPLETE  METABOLIC PANEL WITH GFR - CBC with Differential/Platelet - hydrochlorothiazide (HYDRODIURIL) 25 MG tablet; Take 1 tablet (25 mg total) by mouth daily.  Dispense: 90 tablet; Refill: 1  5. Anemia, unspecified type - CBC with Differential/Platelet  6. Depression, major, single episode, complete remission (HCC) Stable, continue current regimen - sertraline (ZOLOFT) 100 MG tablet; Take 1 tablet (100 mg total) by mouth daily.  Dispense: 90 tablet; Refill: 1  7. Class 1 obesity due to excess calories without serious comorbidity with body mass index (BMI) of 32.0 to 32.9 in adult --education provided on healthy weight loss through increase in physical activity and proper nutrition    8. Herpes genitalis in women Refill for prevention  - valACYclovir (VALTREX) 500 MG tablet; Take 1 tablet (500 mg total) by mouth daily.  Dispense: 90 tablet; Refill: 1   Return in about 6 months (around 09/14/2023) for routine follow up .  Janene Harvey. Biagio Borg [facility] [phone]

## 2023-03-17 NOTE — Progress Notes (Signed)
Office Visit Note   Patient: Alejandra Crane           Date of Birth: 1950/07/10           MRN: [id number] Visit Date: 03/13/2023              Requested by: [name], NP [address] [place]. [place],  [place] [address] PCP: [name], NP  Chief Complaint  Patient presents with   Right Foot - Follow-up    S/p talonavicular and subtalar fusion      HPI: Patient is 5 months status post subtalar and talonavicular fusion.  Patient is currently in new balance sneakers.  Assessment & Plan: Visit Diagnoses:  1. S/P ankle fusion     Plan: Patient will continue increasing her activities as tolerated.  Reevaluate in 3 months with repeat three-view radiographs of the right foot.  Follow-Up Instructions: No follow-ups on file.   Ortho Exam  Patient is alert, oriented, no adenopathy, well-dressed, normal affect, normal respiratory effort. Examination patient has good range of motion of the ankle.  The surgical incisions are well-healed there is no swelling no cellulitis.  Imaging: No results found. No images are attached to the encounter.  Labs: Lab Results  Component Value Date   HGBA1C 5.6 03/12/2021   HGBA1C 5.8 (H) 08/02/2020   HGBA1C 5.9 (H) 01/19/2020   ESRSEDRATE 29 05/19/2019   LABURIC 4.3 12/03/2021     Lab Results  Component Value Date   ALBUMIN 4.1 08/02/2020   ALBUMIN 4.0 01/19/2020   ALBUMIN 2.5 (L) 08/23/2019    Lab Results  Component Value Date   MG 1.9 08/23/2019   Lab Results  Component Value Date   VD25OH 42 09/12/2022   VD25OH 37 03/12/2021   VD25OH 90.3 08/02/2020    No results found for: "PREALBUMIN"    Latest Ref Rng & Units 09/12/2022    9:43 AM 12/03/2021    3:44 PM 09/17/2021   10:12 AM  CBC EXTENDED  WBC 3.8 - 10.8 Thousand/uL 6.3  7.2  5.9   RBC 3.80 - 5.10 Million/uL 4.05  3.97  3.89   Hemoglobin 11.7 - 15.5 g/dL 40.3  47.4  25.9   HCT 35.0 - 45.0 % 38.4  37.7  37.6   Platelets 140 - 400 Thousand/uL 266  261  264    NEUT# 1,500 - 7,800 cells/uL 3,219  3,722  2,938   Lymph# 850 - 3,900 cells/uL 2,463  2,714  2,407      There is no height or weight on file to calculate BMI.  Orders:  Orders Placed This Encounter  Procedures   XR Foot Complete Right   No orders of the defined types were placed in this encounter.    Procedures: No procedures performed  Clinical Data: No additional findings.  ROS:  All other systems negative, except as noted in the HPI. Review of Systems  Objective: Vital Signs: There were no vitals taken for this visit.  Specialty Comments:  No specialty comments available.  PMFS History: Patient Active Problem List   Diagnosis Date Noted   Posterior tibial tendon dysfunction, right 10/09/2022   Primary osteoarthritis of left hand 09/16/2022   Arthritis of carpometacarpal [facility]) joint of left thumb 10/09/2021   History of GI diverticular bleed 09/16/2019   Acute lower GI bleeding 08/22/2019   Orthostatic syncope 08/22/2019   Osteopenia 09/16/2018   Essential hypertension 02/19/2017   Mixed hyperlipidemia 02/19/2017   Depression, major, single episode,  complete remission (HCC) 02/19/2017   Herpes genitalis in women 02/19/2017   Past Medical History:  Diagnosis Date   Anemia    Anxiety    Arthritis    Asthma    Depression    Diverticulosis    noted on colonscopy    Elevated cholesterol    GERD (gastroesophageal reflux disease)    H/O mammogram    2015, 2016   Hepatitis    Hep B in 1980s   Herpes    Not gential   High blood pressure    History of esophagogastroduodenoscopy (EGD)    2014, 2016   History of PFTs 10/26/2012   Lower GI bleed 01/19/2015   Related to mobic 15 mg    Mitral valve prolapse 09/06/1996   Osteoarthritis of both knees    Pneumonia     Family History  Problem Relation Age of Onset   Heart attack Mother    CVA Mother    Diabetes Mother    Congestive Heart Failure Mother    Stroke Mother    Heart disease Mother     Hyperlipidemia Mother    Hypertension Mother    Depression Mother    Obesity Mother    Lung cancer Sister 89   Stomach cancer Brother    Neurologic Disorder Son    Colon cancer Niece    Diabetes Maternal Grandmother     Past Surgical History:  Procedure Laterality Date   BREAST EXCISIONAL BIOPSY Left    benign   BREAST SURGERY  05/20/2007   [facility], Dr.William Purkert    COLONOSCOPY     2014, 2016 Dr.Bajani Manalo, Dr.Mahmood Abedi    FOOT ARTHRODESIS Right 10/09/2022   Procedure: RIGHT TALONAVICULAR AND SUBTALAR FUSION;  Surgeon: Nadara Mustard, MD;  Location: [facility] OR;  Service: Orthopedics;  Laterality: Right;   KNEE ARTHROSCOPY Right    Social History   Occupational History   Occupation: RN  Tobacco Use   Smoking status: Former    Current packs/day: 0.00    Average packs/day: 0.3 packs/day for 10.0 years (2.5 ttl pk-yrs)    Types: Cigarettes    Start date: 09/15/1976    Quit date: 09/16/1986    Years since quitting: 36.5   Smokeless tobacco: Never   Tobacco comments:    Quit in her 37's   Vaping Use   Vaping status: Never Used  Substance and Sexual Activity   Alcohol use: Yes    Comment: Occasionally red wine    Drug use: No   Sexual activity: Not Currently

## 2023-03-18 LAB — COMPLETE METABOLIC PANEL WITH GFR
AG Ratio: 1.3 (calc) (ref 1.0–2.5)
ALT: 11 U/L (ref 6–29)
AST: 17 U/L (ref 10–35)
Albumin: 3.9 g/dL (ref 3.6–5.1)
Alkaline phosphatase (APISO): 79 U/L (ref 37–153)
BUN: 21 mg/dL (ref 7–25)
CO2: 30 mmol/L (ref 20–32)
Calcium: 9.7 mg/dL (ref 8.6–10.4)
Chloride: 105 mmol/L (ref 98–110)
Creat: 0.93 mg/dL (ref 0.60–1.00)
Globulin: 3 g/dL (ref 1.9–3.7)
Glucose, Bld: 82 mg/dL (ref 65–99)
Potassium: 4.1 mmol/L (ref 3.5–5.3)
Sodium: 142 mmol/L (ref 135–146)
Total Bilirubin: 0.4 mg/dL (ref 0.2–1.2)
Total Protein: 6.9 g/dL (ref 6.1–8.1)
eGFR: 66 mL/min/{1.73_m2} (ref >=60)

## 2023-03-18 LAB — CBC WITH DIFFERENTIAL/PLATELET
Absolute Lymphocytes: 2365 {cells}/uL (ref 850–3900)
Absolute Monocytes: 664 {cells}/uL (ref 200–950)
Basophils Absolute: 44 {cells}/uL (ref 0–200)
Basophils Relative: 0.6 %
Eosinophils Absolute: 212 {cells}/uL (ref 15–500)
Eosinophils Relative: 2.9 %
HCT: 38.4 % (ref 35.0–45.0)
Hemoglobin: 12.4 g/dL (ref 11.7–15.5)
MCH: 30.8 pg (ref 27.0–33.0)
MCHC: 32.3 g/dL (ref 32.0–36.0)
MCV: 95.3 fL (ref 80.0–100.0)
MPV: 10.4 fL (ref 7.5–12.5)
Monocytes Relative: 9.1 %
Neutro Abs: 4015 {cells}/uL (ref 1500–7800)
Neutrophils Relative %: 55 %
Platelets: 269 10*3/uL (ref 140–400)
RBC: 4.03 10*6/uL (ref 3.80–5.10)
RDW: 13.1 % (ref 11.0–15.0)
Total Lymphocyte: 32.4 %
WBC: 7.3 10*3/uL (ref 3.8–10.8)

## 2023-03-18 LAB — LIPID PANEL
Cholesterol: 182 mg/dL (ref <200)
HDL: 76 mg/dL (ref >=50)
LDL Cholesterol (Calc): 92 mg/dL
Non-HDL Cholesterol (Calc): 106 mg/dL (ref <130)
Total CHOL/HDL Ratio: 2.4 (calc) (ref <5.0)
Triglycerides: 56 mg/dL (ref <150)

## 2023-06-11 ENCOUNTER — Encounter (INDEPENDENT_AMBULATORY_CARE_PROVIDER_SITE_OTHER): Payer: Self-pay

## 2023-06-12 ENCOUNTER — Ambulatory Visit: Payer: Medicare Other | Admitting: Orthopedic Surgery

## 2023-09-06 ENCOUNTER — Other Ambulatory Visit: Payer: Self-pay | Admitting: Nurse Practitioner

## 2023-09-06 ENCOUNTER — Other Ambulatory Visit: Payer: Self-pay | Admitting: Student

## 2023-09-06 DIAGNOSIS — A6009 Herpesviral infection of other urogenital tract: Secondary | ICD-10-CM

## 2023-09-06 DIAGNOSIS — F325 Major depressive disorder, single episode, in full remission: Secondary | ICD-10-CM

## 2023-09-06 DIAGNOSIS — I10 Essential (primary) hypertension: Secondary | ICD-10-CM

## 2023-09-15 ENCOUNTER — Ambulatory Visit: Payer: Commercial Managed Care - PPO | Admitting: Nurse Practitioner

## 2023-10-14 ENCOUNTER — Ambulatory Visit (INDEPENDENT_AMBULATORY_CARE_PROVIDER_SITE_OTHER): Payer: Commercial Managed Care - PPO | Admitting: Nurse Practitioner

## 2023-10-14 DIAGNOSIS — Z0000 Encounter for general adult medical examination without abnormal findings: Secondary | ICD-10-CM

## 2023-10-14 NOTE — Progress Notes (Signed)
  This service is provided via telemedicine  No vital signs collected/recorded due to the encounter was a telemedicine visit.   Location of patient (ex: home, work):  Home  Patient consents to a telephone visit:  Yes  Location of the provider (ex: office, home):  Office [place].   Name of any referring provider:  na  Names of all persons participating in the telemedicine service and their role in the encounter:  Mirian Ames, Patient, Rexie Catena, CMA, [facility], NP  Time spent on call:  7:11

## 2023-10-14 NOTE — Patient Instructions (Signed)
  Ms. Chatterjee , Thank you for taking time to come for your Medicare Wellness Visit. I appreciate your ongoing commitment to your health goals. Please review the following plan we discussed and let me know if I can assist you in the future.   To make appt with eye doctor.    This is a list of the screening recommended for you and due dates:  Health Maintenance  Topic Date Due   COVID-19 Vaccine (6 - 2024-25 season) 07/04/2023   Flu Shot  12/05/2023   Mammogram  03/06/2024   Colon Cancer Screening  05/06/2024   Medicare Annual Wellness Visit  10/13/2024   DTaP/Tdap/Td vaccine (4 - Td or Tdap) 10/17/2024   Pneumonia Vaccine  Completed   DEXA scan (bone density measurement)  Completed   Hepatitis C Screening  Completed   Zoster (Shingles) Vaccine  Completed   HPV Vaccine  Aged Out   Meningitis B Vaccine  Aged Out

## 2023-10-14 NOTE — Progress Notes (Signed)
 Subjective:   Alejandra Crane is a 73 y.o. female who presents for Medicare Annual (Subsequent) preventive examination.  Visit Complete: Virtual I connected with  Terease Marcotte on 10/14/23 by a video and audio enabled telemedicine application and verified that I am speaking with the correct person using two identifiers.  Patient Location: Home  Provider Location: Office/Clinic  I discussed the limitations of evaluation and management by telemedicine. The patient expressed understanding and agreed to proceed.  Vital Signs: Because this visit was a virtual/telehealth visit, some criteria may be missing or patient reported. Any vitals not documented were not able to be obtained and vitals that have been documented are patient reported.   Cardiac Risk Factors include: advanced age (>40men, >62 women);obesity (BMI >30kg/m2);dyslipidemia;hypertension;family history of premature cardiovascular disease     Objective:     There were no vitals filed for this visit. There is no height or weight on file to calculate BMI.     10/14/2023    9:47 AM 03/17/2023    8:43 AM 10/11/2022    3:00 PM 10/09/2022    6:27 AM 09/16/2022    9:44 AM 03/18/2022   10:09 AM 12/03/2021    3:32 PM  Advanced Directives  Does Patient Have a Medical Advance Directive? Yes Yes Yes Yes Yes Yes Yes  Type of Estate agent of [place];Living will Healthcare Power of [place];Living will Healthcare Power of [place];Living will Healthcare Power of [place];Living will Living will;Healthcare Power of [employer] Power of [place];Living will Healthcare Power of [place];Living will  Does patient want to make changes to medical advance directive? No - Patient declined No - Patient declined No - Patient declined  No - Patient declined No - Patient declined No - Patient declined  Copy of Healthcare Power of Attorney in Chart? Yes - validated most recent copy scanned in chart (See row information)  Yes - validated most recent copy scanned in chart (See row information) Yes - validated most recent copy scanned in chart (See row information) Yes - validated most recent copy scanned in chart (See row information) Yes - validated most recent copy scanned in chart (See row information) Yes - validated most recent copy scanned in chart (See row information) Yes - validated most recent copy scanned in chart (See row information)    Current Medications (verified) Outpatient Encounter Medications as of 10/14/2023  Medication Sig   calcium  carbonate (CALCIUM  600) 600 MG TABS tablet Take 1 tablet (600 mg total) by mouth 2 (two) times daily with a meal.   Cholecalciferol (VITAMIN D3) 50 MCG (2000 UT) capsule Take 1 capsule (2,000 Units total) by mouth daily.   hydrochlorothiazide  (HYDRODIURIL ) 25 MG tablet TAKE 1 TABLET BY MOUTH EVERY DAY   rosuvastatin  (CRESTOR ) 10 MG tablet Take 1 tablet (10 mg total) by mouth daily.   sertraline  (ZOLOFT ) 100 MG tablet TAKE 1 TABLET BY MOUTH EVERY DAY   valACYclovir  (VALTREX ) 500 MG tablet TAKE 1 TABLET BY MOUTH EVERY DAY   No facility-administered encounter medications on file as of 10/14/2023.    Allergies (verified) Mobic [meloxicam]   History: Past Medical History:  Diagnosis Date   Anemia    Anxiety    Arthritis    Asthma    Depression    Diverticulosis    noted on colonscopy    Elevated cholesterol    GERD (gastroesophageal reflux disease)    H/O mammogram    2015, 2016   Hepatitis    Hep B in 1980s  Herpes    Not gential   High blood pressure    History of esophagogastroduodenoscopy (EGD)    2014, 2016   History of PFTs 10/26/2012   Lower GI bleed 01/19/2015   Related to mobic 15 mg    Mitral valve prolapse 09/06/1996   Osteoarthritis of both knees    Pneumonia    Past Surgical History:  Procedure Laterality Date   BREAST EXCISIONAL BIOPSY Left    benign   BREAST SURGERY  05/20/2007   [facility], Dr.William Purkert    COLONOSCOPY      2014, 2016 Dr.Bajani Manalo, Dr.Mahmood Abedi    FOOT ARTHRODESIS Right 10/09/2022   Procedure: RIGHT TALONAVICULAR AND SUBTALAR FUSION;  Surgeon: Timothy Ford, MD;  Location: [facility];  Service: Orthopedics;  Laterality: Right;   KNEE ARTHROSCOPY Right    Family History  Problem Relation Age of Onset   Heart attack Mother    CVA Mother    Diabetes Mother    Congestive Heart Failure Mother    Stroke Mother    Heart disease Mother    Hyperlipidemia Mother    Hypertension Mother    Depression Mother    Obesity Mother    Lung cancer Sister 42   Stomach cancer Brother    Neurologic Disorder Son    Colon cancer Niece    Diabetes Maternal Grandmother    Social History   Socioeconomic History   Marital status: Divorced    Spouse name: Not on file   Number of children: 0   Years of education: Not on file   Highest education level: Bachelor's degree (e.g., BA, AB, BS)  Occupational History   Occupation: RN  Tobacco Use   Smoking status: Former    Current packs/day: 0.00    Average packs/day: 0.3 packs/day for 10.0 years (2.5 ttl pk-yrs)    Types: Cigarettes    Start date: 09/15/1976    Quit date: 09/16/1986    Years since quitting: 37.1   Smokeless tobacco: Never   Tobacco comments:    Quit in her 57's   Vaping Use   Vaping status: Never Used  Substance and Sexual Activity   Alcohol use: Yes    Comment: Occasionally red wine    Drug use: No   Sexual activity: Not Currently  Other Topics Concern   Not on file  Social History Narrative   Diet: Regular       Caffeine: Yes, infrequently when working       Married, if yes what year: Divorced, married in 1974      Do you live in a house, apartment, assisted living, condo, trailer, ect: House, 1 stories, one person       Pets: None      Current/Past profession:       Exercise: Yes, walking, hiking, and gym    Right handed   One story home   Drinks caffeine, occasionally weekly      Living Will: No   DNR: No    POA/HPOA: No      Questions below not in new patient packet    Functional Status:   Do you have difficulty bathing or dressing yourself?   Do you have difficulty preparing food or eating?   Do you have difficulty managing your medications?   Do you have difficulty managing your finances?   Do you have difficulty affording your medications?   Social Drivers of Health   Financial Resource Strain: Low Risk  (10/14/2023)  Overall Financial Resource Strain (CARDIA)    Difficulty of Paying Living Expenses: Not very hard  Food Insecurity: Unknown (10/14/2023)   Hunger Vital Sign    Worried About Running Out of Food in the Last Year: Patient declined    Ran Out of Food in the Last Year: Never true  Transportation Needs: No Transportation Needs (10/14/2023)   PRAPARE - Administrator, Civil Service (Medical): No    Lack of Transportation (Non-Medical): No  Physical Activity: Insufficiently Active (10/14/2023)   Exercise Vital Sign    Days of Exercise per Week: 2 days    Minutes of Exercise per Session: 40 min  Stress: No Stress Concern Present (10/14/2023)   [employer] of Occupational Health - Occupational Stress Questionnaire    Feeling of Stress : Only a little  Social Connections: Moderately Integrated (10/14/2023)   Social Connection and Isolation Panel [NHANES]    Frequency of Communication with Friends and Family: Twice a week    Frequency of Social Gatherings with Friends and Family: Twice a week    Attends Religious Services: More than 4 times per year    Active Member of [employer] or Organizations: Yes    Attends Engineer, structural: More than 4 times per year    Marital Status: Divorced    Tobacco Counseling Counseling given: Not Answered Tobacco comments: Quit in her 30's    Clinical Intake:  Pre-visit preparation completed: Yes  Pain : No/denies pain     BMI - recorded: 32 Nutritional Risks: None  How often do you need to have someone help  you when you read instructions, pamphlets, or other written materials from your doctor or pharmacy?: 1 - Never         Activities of Daily Living    10/14/2023    9:46 AM 10/11/2023    6:21 PM  In your present state of health, do you have any difficulty performing the following activities:  Hearing? 0 0  Vision? 0 0  Difficulty concentrating or making decisions? 0 0  Walking or climbing stairs? 0 0  Dressing or bathing? 0 0  Doing errands, shopping? 0 0  Preparing Food and eating ? N N  Using the Toilet? N N  In the past six months, have you accidently leaked urine? N N  Do you have problems with loss of bowel control? N N  Managing your Medications? N N  Managing your Finances? N N  Housekeeping or managing your Housekeeping? N N    Patient Care Team: Verma Gobble, NP as PCP - General (Geriatric Medicine) Merriam Abbey, DO as Consulting Physician (Neurology)  Indicate any recent Medical Services you may have received from other than [facility] providers in the past year (date may be approximate).     Assessment:    This is a routine wellness examination for Anecia.  Hearing/Vision screen Vision Screening - Comments:: Does NOT have Eye Dr.    Carmelo Chock Addressed   None    Depression Screen    10/14/2023    9:46 AM 03/17/2023   10:21 AM 10/11/2022    2:57 PM 09/16/2022   10:11 AM 10/04/2021    3:00 PM 03/12/2021   10:10 AM 10/03/2020   10:33 AM  PHQ 2/9 Scores  PHQ - 2 Score 0 0 0 0 0 0 0    Fall Risk    10/14/2023    9:46 AM 10/11/2023    6:21 PM 03/17/2023   10:21  AM 10/11/2022    2:57 PM 10/07/2022    5:51 PM  Fall Risk   Falls in the past year? 0 0 1 0 0  Number falls in past yr: 0 1 1 0 0  Injury with Fall? 0 0 0 0 0  Risk for fall due to : No Fall Risks  No Fall Risks No Fall Risks   Follow up Falls evaluation completed  Falls evaluation completed Falls evaluation completed     MEDICARE RISK AT HOME: Medicare Risk at Home Any stairs in or around the home?:  Yes If so, are there any without handrails?: Yes Home free of loose throw rugs in walkways, pet beds, electrical cords, etc?: Yes Adequate lighting in your home to reduce risk of falls?: Yes Life alert?: No Use of a cane, walker or w/c?: No Grab bars in the bathroom?: No Shower chair or bench in shower?: Yes Elevated toilet seat or a handicapped toilet?: Yes  TIMED UP AND GO:  Was the test performed?  No    Cognitive Function:    03/23/2018   10:58 AM 03/21/2017    9:14 AM  MMSE - Mini Mental State Exam  Orientation to time 5 5  Orientation to Place 5 5  Registration 3 3  Attention/ Calculation 5 5  Recall 3 3  Language- name 2 objects 2 2  Language- repeat 1 1  Language- follow 3 step command 3 3  Language- read & follow direction 1 1  Write a sentence 1 1  Copy design 0 1  Total score 29 30        10/14/2023    9:48 AM 10/11/2022    2:58 PM 10/04/2021    3:04 PM 10/03/2020   10:36 AM  6CIT Screen  What Year? 0 points 0 points 0 points 0 points  What month? 0 points 0 points 0 points 0 points  What time? 0 points 0 points 0 points 0 points  Count back from 20 0 points 0 points 0 points 0 points  Months in reverse 0 points 0 points 0 points 0 points  Repeat phrase 0 points 0 points 0 points 0 points  Total Score 0 points 0 points 0 points 0 points    Immunizations Immunization History  Administered Date(s) Administered   Fluad Quad(high Dose 65+) 02/22/2020, 02/27/2021, 02/26/2022   Hepatitis A 01/21/2007, 04/12/2008   Influenza, High Dose Seasonal PF 02/25/2018, 02/04/2019, 01/03/2023   Influenza, Seasonal, Injecte, Preservative Fre 02/05/2017   MMR 09/24/2022, 10/25/2022   Meningococcal Conjugate 10/25/2022   PFIZER Comirnaty(Gray Top)Covid-19 Tri-Sucrose Vaccine 11/08/2020   PFIZER(Purple Top)SARS-COV-2 Vaccination 05/05/2019, 05/26/2019, 03/17/2020   Pneumococcal Conjugate-13 12/25/2015   Pneumococcal Polysaccharide-23 01/13/2012, 03/21/2017   Td  03/06/2005, 01/23/2012   Tdap 10/18/2014   Typhoid Live 09/24/2022   Unspecified SARS-COV-2 Vaccination 01/03/2023   Yellow Fever 09/24/2022   Zoster Recombinant(Shingrix) 05/19/2018, 11/03/2018   Zoster, Live 05/06/2012    TDAP status: Up to date  Flu Vaccine status: Up to date  Pneumococcal vaccine status: Up to date  Covid-19 vaccine status: Information provided on how to obtain vaccines.   Qualifies for Shingles Vaccine? Yes   Zostavax completed Yes   Shingrix Completed?: Yes  Screening Tests Health Maintenance  Topic Date Due   COVID-19 Vaccine (6 - 2024-25 season) 07/04/2023   INFLUENZA VACCINE  12/05/2023   MAMMOGRAM  03/06/2024   Colonoscopy  05/06/2024   Medicare Annual Wellness (AWV)  10/13/2024   DTaP/Tdap/Td (4 - Td  or Tdap) 10/17/2024   Pneumonia Vaccine 39+ Years old  Completed   DEXA SCAN  Completed   Hepatitis C Screening  Completed   Zoster Vaccines- Shingrix  Completed   HPV VACCINES  Aged Out   Meningococcal B Vaccine  Aged Out    Health Maintenance  Health Maintenance Due  Topic Date Due   COVID-19 Vaccine (6 - 2024-25 season) 07/04/2023    Colorectal cancer screening: Type of screening: Colonoscopy. Completed 2016. Repeat every 10 years  Mammogram status: Completed 03/07/2023. Repeat every year  Bone Density status: Completed 03/06/2022. Results reflect: Bone density results: OSTEOPENIA. Repeat every 2 years.  Lung Cancer Screening: (Low Dose CT Chest recommended if Age 65-80 years, 20 pack-year currently smoking OR have quit w/in 15years.) does not qualify.   Lung Cancer Screening Referral: na  Additional Screening:  Hepatitis C Screening: does qualify; Completed   Vision Screening: Recommended annual ophthalmology exams for early detection of glaucoma and other disorders of the eye. Is the patient up to date with their annual eye exam?  No  Who is the provider or what is the name of the office in which the patient attends annual eye  exams? Previously saw [facility] but wants another eye doctor.  If pt is not established with a provider, would they like to be referred to a provider to establish care? No .   Dental Screening: Recommended annual dental exams for proper oral hygiene   Community Resource Referral / Chronic Care Management: CRR required this visit?  No   CCM required this visit?  No     Plan:     I have personally reviewed and noted the following in the patient's chart:   Medical and social history Use of alcohol, tobacco or illicit drugs  Current medications and supplements including opioid prescriptions. Patient is not currently taking opioid prescriptions. Functional ability and status Nutritional status Physical activity Advanced directives List of other physicians Hospitalizations, surgeries, and ER visits in previous 12 months Vitals Screenings to include cognitive, depression, and falls Referrals and appointments  In addition, I have reviewed and discussed with patient certain preventive protocols, quality metrics, and best practice recommendations. A written personalized care plan for preventive services as well as general preventive health recommendations were provided to patient.     Verma Gobble, NP   10/14/2023   After Visit Summary: (MyChart) Due to this being a telephonic visit, the after visit summary with patients personalized plan was offered to patient via MyChart

## 2023-11-26 ENCOUNTER — Encounter: Payer: Self-pay | Admitting: Nurse Practitioner

## 2023-11-26 ENCOUNTER — Other Ambulatory Visit: Payer: Self-pay | Admitting: Nurse Practitioner

## 2023-11-26 ENCOUNTER — Ambulatory Visit (INDEPENDENT_AMBULATORY_CARE_PROVIDER_SITE_OTHER): Admitting: Nurse Practitioner

## 2023-11-26 VITALS — BP 100/62 | HR 60 | Temp 96.0°F | Resp 16 | Ht 62.5 in | Wt 180.8 lb

## 2023-11-26 DIAGNOSIS — I10 Essential (primary) hypertension: Secondary | ICD-10-CM

## 2023-11-26 DIAGNOSIS — Z1231 Encounter for screening mammogram for malignant neoplasm of breast: Secondary | ICD-10-CM

## 2023-11-26 DIAGNOSIS — Z6832 Body mass index (BMI) 32.0-32.9, adult: Secondary | ICD-10-CM | POA: Diagnosis not present

## 2023-11-26 DIAGNOSIS — G8929 Other chronic pain: Secondary | ICD-10-CM | POA: Diagnosis not present

## 2023-11-26 DIAGNOSIS — E782 Mixed hyperlipidemia: Secondary | ICD-10-CM

## 2023-11-26 DIAGNOSIS — M25571 Pain in right ankle and joints of right foot: Secondary | ICD-10-CM

## 2023-11-26 DIAGNOSIS — E6609 Other obesity due to excess calories: Secondary | ICD-10-CM | POA: Insufficient documentation

## 2023-11-26 DIAGNOSIS — E559 Vitamin D deficiency, unspecified: Secondary | ICD-10-CM | POA: Insufficient documentation

## 2023-11-26 DIAGNOSIS — F325 Major depressive disorder, single episode, in full remission: Secondary | ICD-10-CM | POA: Diagnosis not present

## 2023-11-26 DIAGNOSIS — E66811 Obesity, class 1: Secondary | ICD-10-CM | POA: Diagnosis not present

## 2023-11-26 LAB — COMPREHENSIVE METABOLIC PANEL WITH GFR
AG Ratio: 1.5 (calc) (ref 1.0–2.5)
ALT: 10 U/L (ref 6–29)
AST: 17 U/L (ref 10–35)
Albumin: 4.1 g/dL (ref 3.6–5.1)
Alkaline phosphatase (APISO): 65 U/L (ref 37–153)
BUN: 24 mg/dL (ref 7–25)
CO2: 31 mmol/L (ref 20–32)
Calcium: 9.6 mg/dL (ref 8.6–10.4)
Chloride: 104 mmol/L (ref 98–110)
Creat: 0.92 mg/dL (ref 0.60–1.00)
Globulin: 2.8 g/dL (ref 1.9–3.7)
Glucose, Bld: 86 mg/dL (ref 65–99)
Potassium: 3.9 mmol/L (ref 3.5–5.3)
Sodium: 142 mmol/L (ref 135–146)
Total Bilirubin: 0.4 mg/dL (ref 0.2–1.2)
Total Protein: 6.9 g/dL (ref 6.1–8.1)
eGFR: 67 mL/min/1.73m2 (ref >=60)

## 2023-11-26 LAB — CBC WITH DIFFERENTIAL/PLATELET
Absolute Lymphocytes: 2375 {cells}/uL (ref 850–3900)
Absolute Monocytes: 508 {cells}/uL (ref 200–950)
Basophils Absolute: 50 {cells}/uL (ref 0–200)
Basophils Relative: 0.8 %
Eosinophils Absolute: 136 {cells}/uL (ref 15–500)
Eosinophils Relative: 2.2 %
HCT: 37.8 % (ref 35.0–45.0)
Hemoglobin: 11.9 g/dL (ref 11.7–15.5)
MCH: 31.5 pg (ref 27.0–33.0)
MCHC: 31.5 g/dL — ABNORMAL LOW (ref 32.0–36.0)
MCV: 100 fL (ref 80.0–100.0)
MPV: 10.5 fL (ref 7.5–12.5)
Monocytes Relative: 8.2 %
Neutro Abs: 3131 {cells}/uL (ref 1500–7800)
Neutrophils Relative %: 50.5 %
Platelets: 261 Thousand/uL (ref 140–400)
RBC: 3.78 Million/uL — ABNORMAL LOW (ref 3.80–5.10)
RDW: 13.6 % (ref 11.0–15.0)
Total Lymphocyte: 38.3 %
WBC: 6.2 Thousand/uL (ref 3.8–10.8)

## 2023-11-26 LAB — LIPID PANEL
Cholesterol: 202 mg/dL — ABNORMAL HIGH (ref <200)
HDL: 76 mg/dL (ref >=50)
LDL Cholesterol (Calc): 112 mg/dL — ABNORMAL HIGH
Non-HDL Cholesterol (Calc): 126 mg/dL (ref <130)
Total CHOL/HDL Ratio: 2.7 (calc) (ref <5.0)
Triglycerides: 53 mg/dL (ref <150)

## 2023-11-26 NOTE — Progress Notes (Signed)
 Careteam: Patient Care Team: Alejandra Harlene POUR, NP as PCP - General (Geriatric Medicine) Skeet Juliene SAUNDERS, DO as Consulting Physician (Neurology)  PLACE OF SERVICE:  [facility]  Advanced Directive information    Allergies  Allergen Reactions   Mobic [Meloxicam]     GI bleed    Chief Complaint  Patient presents with   Medical Management of Chronic Issues    Routine visit. Discuss the need for Covid Booster.    HPI:  Discussed the use of AI scribe software for clinical note transcription with the patient, who gave verbal consent to proceed.  History of Present Illness Alejandra Crane is a 73 year old female who presents for a routine follow-up visit.  She is concerned about memory loss, noting her neighbor's significant memory decline. She does not experience forgetfulness. She remains active, assisting her son and a friend with school, and is interested in preventive measures for cognitive health. No current signs of memory loss are present.  She underwent ankle fusion surgery on her right foot on October 09, 2022. Post-surgery has  experienced tingling, burning, numbness, and mild pain in her right foot and ankle, which began approximately six months after the surgery. She manages the symptoms with massage and movement, and they do not require medication.  She is struggling with weight management and is considering returning to a weight loss program that previously helped her lose 30 pounds. Her goal weight is 150 pounds, and she is exploring options covered by her insurance.  She is currently taking Zoloft  100 mg daily for depression and feels that her symptoms are in remission. However, she has been contemplating death and dying due to the illness and death of people around her. She lives alone and finds cooking for herself to be a challenge, often lacking the desire to cook or eat. Wants to eat well with food that taste good.    Review of Systems:  Review of Systems   Constitutional:  Negative for chills, fever and weight loss.  HENT:  Negative for tinnitus.   Respiratory:  Negative for cough, sputum production and shortness of breath.   Cardiovascular:  Negative for chest pain, palpitations and leg swelling.  Gastrointestinal:  Negative for abdominal pain, constipation, diarrhea and heartburn.  Genitourinary:  Negative for dysuria, frequency and urgency.  Musculoskeletal:  Negative for back pain, falls, joint pain and myalgias.  Skin: Negative.   Neurological:  Positive for tingling. Negative for dizziness and headaches.  Psychiatric/Behavioral:  Negative for depression and memory loss. The patient does not have insomnia.     Past Medical History:  Diagnosis Date   Anemia    Anxiety    Arthritis    Asthma    Depression    Diverticulosis    noted on colonscopy    Elevated cholesterol    GERD (gastroesophageal reflux disease)    H/O mammogram    2015, 2016   Hepatitis    Hep B in 1980s   Herpes    Not gential   High blood pressure    History of esophagogastroduodenoscopy (EGD)    2014, 2016   History of PFTs 10/26/2012   Lower GI bleed 01/19/2015   Related to mobic 15 mg    Mitral valve prolapse 09/06/1996   Osteoarthritis of both knees    Pneumonia    Past Surgical History:  Procedure Laterality Date   BREAST EXCISIONAL BIOPSY Left    benign   BREAST SURGERY  05/20/2007  [facility], Dr.William Purkert    COLONOSCOPY     2014, 2016 Dr.Bajani Manalo, Dr.Mahmood Abedi    FOOT ARTHRODESIS Right 10/09/2022   Procedure: RIGHT TALONAVICULAR AND SUBTALAR FUSION;  Surgeon: Harden Jerona GAILS, MD;  Location[facility] OR;  Service: Orthopedics;  Laterality: Right;   KNEE ARTHROSCOPY Right    Social History:   reports that she quit smoking about 37 years ago. Her smoking use included cigarettes. She started smoking about 47 years ago. She has a 2.5 pack-year smoking history. She has never used smokeless tobacco. She reports current alcohol use. She  reports that she does not use drugs.  Family History  Problem Relation Age of Onset   Heart attack Mother    CVA Mother    Diabetes Mother    Congestive Heart Failure Mother    Stroke Mother    Heart disease Mother    Hyperlipidemia Mother    Hypertension Mother    Depression Mother    Obesity Mother    Lung cancer Sister 44   Stomach cancer Brother    Neurologic Disorder Son    Colon cancer Niece    Diabetes Maternal Grandmother     Medications: Patient's Medications  New Prescriptions   No medications on file  Previous Medications   CALCIUM  CARBONATE (CALCIUM  600) 600 MG TABS TABLET    Take 1 tablet (600 mg total) by mouth 2 (two) times daily with a meal.   CHOLECALCIFEROL (VITAMIN D3) 50 MCG (2000 UT) CAPSULE    Take 1 capsule (2,000 Units total) by mouth daily.   HYDROCHLOROTHIAZIDE  (HYDRODIURIL ) 25 MG TABLET    TAKE 1 TABLET BY MOUTH EVERY DAY   ROSUVASTATIN  (CRESTOR ) 10 MG TABLET    Take 1 tablet (10 mg total) by mouth daily.   SERTRALINE  (ZOLOFT ) 100 MG TABLET    TAKE 1 TABLET BY MOUTH EVERY DAY   VALACYCLOVIR  (VALTREX ) 500 MG TABLET    TAKE 1 TABLET BY MOUTH EVERY DAY  Modified Medications   No medications on file  Discontinued Medications   No medications on file    Physical Exam:  Vitals:   11/26/23 1300  BP: 100/62  Pulse: 60  Resp: 16  Temp: (!) 96 F (35.6 C)  SpO2: 96%  Weight: 180 lb 12.8 oz (82 kg)  Height: 5' 2.5 (1.588 m)   Body mass index is 32.54 kg/m. Wt Readings from Last 3 Encounters:  11/26/23 180 lb 12.8 oz (82 kg)  03/17/23 181 lb 12.8 oz (82.5 kg)  10/09/22 180 lb (81.6 kg)    Physical Exam Constitutional:      General: She is not in acute distress.    Appearance: She is well-developed. She is not diaphoretic.  HENT:     Head: Normocephalic and atraumatic.     Mouth/Throat:     Pharynx: No oropharyngeal exudate.  Eyes:     Conjunctiva/sclera: Conjunctivae normal.     Pupils: Pupils are equal, round, and reactive to  light.  Cardiovascular:     Rate and Rhythm: Normal rate and regular rhythm.     Heart sounds: Normal heart sounds.  Pulmonary:     Effort: Pulmonary effort is normal.     Breath sounds: Normal breath sounds.  Abdominal:     General: Bowel sounds are normal.     Palpations: Abdomen is soft.  Musculoskeletal:     Cervical back: Normal range of motion and neck supple.     Right lower leg: No edema.  Left lower leg: No edema.  Skin:    General: Skin is warm and dry.  Neurological:     Mental Status: She is alert.  Psychiatric:        Mood and Affect: Mood normal.     Labs reviewed: Basic Metabolic Panel: Recent Labs    03/17/23 1059  NA 142  K 4.1  CL 105  CO2 30  GLUCOSE 82  BUN 21  CREATININE 0.93  CALCIUM  9.7   Liver Function Tests: Recent Labs    03/17/23 1059  AST 17  ALT 11  BILITOT 0.4  PROT 6.9   No results for input(s): LIPASE, AMYLASE in the last 8760 hours. No results for input(s): AMMONIA in the last 8760 hours. CBC: Recent Labs    03/17/23 1059  WBC 7.3  NEUTROABS 4,015  HGB 12.4  HCT 38.4  MCV 95.3  PLT 269   Lipid Panel: Recent Labs    03/17/23 1059  CHOL 182  HDL 76  LDLCALC 92  TRIG 56  CHOLHDL 2.4   TSH: No results for input(s): TSH in the last 8760 hours. A1C: Lab Results  Component Value Date   HGBA1C 5.6 03/12/2021     Assessment/Plan Mixed hyperlipidemia Assessment & Plan: Continues on crestor  10 mg daily   Orders: -     Lipid panel  Screening mammogram for breast cancer -     3D Screening Mammogram, Left and Right; Future  Class 1 obesity due to excess calories without serious comorbidity with body mass index (BMI) of 32.0 to 32.9 in adult Assessment & Plan: -education provided on healthy weight loss through increase in physical activity and proper nutrition  Would like to do medical weight management vs medication at this time  Orders: -     Amb Ref to Medical Weight Management  Chronic  pain of right ankle Assessment & Plan: Pain improved after fusion but now with numbness- recommended to follow up with ortho at this time   Vitamin D  deficiency Assessment & Plan: Continues on supplement.    Essential hypertension Assessment & Plan: Blood pressure well controlled, goal bp <140/90 Continue current medications and dietary modifications follow metabolic panel  Orders: -     CBC with Differential/Platelet -     Comprehensive metabolic panel with GFR  Depression, major, single episode, complete remission [facility]) Assessment & Plan: Well controlled on zoloft , continue with lifestyle modifications      Return in about 6 months (around 05/28/2024) for routine follow up.  Tityana Pagan K. Alejandra BODILY [facility] [phone]

## 2023-11-26 NOTE — Assessment & Plan Note (Signed)
 Pain improved after fusion but now with numbness- recommended to follow up with ortho at this time

## 2023-11-26 NOTE — Assessment & Plan Note (Signed)
 Blood pressure well controlled, goal bp <140/90 Continue current medications and dietary modifications follow metabolic panel

## 2023-11-26 NOTE — Assessment & Plan Note (Signed)
Continues on supplement 

## 2023-11-26 NOTE — Assessment & Plan Note (Addendum)
-  education provided on healthy weight loss through increase in physical activity and proper nutrition  Would like to do medical weight management vs medication at this time

## 2023-11-26 NOTE — Assessment & Plan Note (Signed)
 Well controlled on zoloft , continue with lifestyle modifications

## 2023-11-26 NOTE — Assessment & Plan Note (Signed)
 Continues on crestor  10 mg daily

## 2023-11-27 ENCOUNTER — Inpatient Hospital Stay
Admission: RE | Admit: 2023-11-27 | Discharge: 2023-11-27 | Discharge status: Home / Self Care | Source: Ambulatory Visit | Attending: Nurse Practitioner | Admitting: Nurse Practitioner

## 2023-11-27 ENCOUNTER — Ambulatory Visit: Payer: Self-pay | Admitting: Nurse Practitioner

## 2023-11-27 DIAGNOSIS — Z1231 Encounter for screening mammogram for malignant neoplasm of breast: Secondary | ICD-10-CM | POA: Diagnosis not present

## 2024-02-09 ENCOUNTER — Encounter: Payer: Self-pay | Admitting: Bariatrics

## 2024-02-09 ENCOUNTER — Ambulatory Visit (INDEPENDENT_AMBULATORY_CARE_PROVIDER_SITE_OTHER): Admitting: Bariatrics

## 2024-02-09 VITALS — BP 119/72 | HR 62 | Ht 62.0 in | Wt 179.0 lb

## 2024-02-09 DIAGNOSIS — Z1331 Encounter for screening for depression: Secondary | ICD-10-CM

## 2024-02-09 DIAGNOSIS — R0602 Shortness of breath: Secondary | ICD-10-CM

## 2024-02-09 DIAGNOSIS — Z0000 Encounter for general adult medical examination without abnormal findings: Secondary | ICD-10-CM | POA: Diagnosis not present

## 2024-02-09 DIAGNOSIS — E559 Vitamin D deficiency, unspecified: Secondary | ICD-10-CM | POA: Diagnosis not present

## 2024-02-09 DIAGNOSIS — E6609 Other obesity due to excess calories: Secondary | ICD-10-CM

## 2024-02-09 DIAGNOSIS — R7309 Other abnormal glucose: Secondary | ICD-10-CM | POA: Diagnosis not present

## 2024-02-09 DIAGNOSIS — R5383 Other fatigue: Secondary | ICD-10-CM

## 2024-02-09 DIAGNOSIS — Z6832 Body mass index (BMI) 32.0-32.9, adult: Secondary | ICD-10-CM

## 2024-02-09 DIAGNOSIS — E669 Obesity, unspecified: Secondary | ICD-10-CM

## 2024-02-09 NOTE — Progress Notes (Signed)
 At a Glance:  Vitals Temp: -- (could not obtain) BP: 119/72 Pulse Rate: 62 SpO2: 91 %   Anthropometric Measurements Height: 5' 2 (1.575 m) Weight: 179 lb (81.2 kg) BMI (Calculated): 32.73 Starting Weight: 179lb Peak Weight: 230lb   Body Composition  Body Fat %: 45.6 % Fat Mass (lbs): 81.6 lbs Muscle Mass (lbs): 92.4 lbs Total Body Water (lbs): 70.4 lbs Visceral Fat Rating : 14   Other Clinical Data Fasting: yes Labs: yes Today's Visit #: 1 Starting Date: 02/09/24     Indirect Calorimeter:   Resting Metabolic Rate ( RMR):  RMR (actual): 1166 kcal RMR (calculated): 1366 kcal The calculated basal metabolic rate is 8633 kcal thus her basal metabolic rate is worse than expected.  Plan:   Indirect calorimeter completed, interpreted and reviewed with patient today and allowed to ask questions.  Discussed the implications for the chosen plan and exercise based on the RMR reading.  Will consider repeating the RMR in the future based on weight loss.    Chief Complaint:  Obesity   Subjective:  Alejandra Crane (MR# [id number]) is a 73 y.o. female who presents for evaluation and treatment of obesity and related comorbidities.  She was seen in the past at the office in [place] by myself and Dwayne Sheen. PA-C  Alejandra Crane is currently in the action stage of change and ready to dedicate time achieving and maintaining a healthier weight. Alejandra Crane is interested in becoming our patient and working on intensive lifestyle modifications including (but not limited to) diet and exercise for weight loss.  Alejandra Crane has been struggling with her weight. She has been unsuccessful in either losing weight, maintaining weight loss, or reaching her healthy weight goal.  Alejandra Crane's habits were reviewed today and are as follows: she started gaining weight after the birth of her first child, she has significant food cravings issues, she skips meals frequently, and she struggles with emotional  eating.  Current or previous pharmacotherapy: None  Response to medication: Never tried medications  Other Fatigue Alejandra Crane admits to daytime somnolence sometimes and restful sleep sometimes. and admits to waking up still tired at times. Alejandra Crane generally gets 8 hours of sleep per night, and states that she has generally restful sleep. Snoring is present. Apneic episodes has been present at times secondary to increased weight and GERD. Epworth Sleepiness Score is 7.   Shortness of Breath Alejandra Crane notes increasing shortness of breath with exercising and seems to be worsening over time with weight gain. She notes getting out of breath sooner with activity than she used to. This has not gotten worse recently. Alejandra Crane denies shortness of breath at rest or orthopnea.  Depression Screen Alejandra Crane's Food and Mood (modified PHQ-9) score was 16. 15-19 moderate severe depression     02/09/2024    8:08 AM  Depression screen PHQ 2/9  Decreased Interest 2  Down, Depressed, Hopeless 2  PHQ - 2 Score 4  Altered sleeping 1  Tired, decreased energy 2  Change in appetite 2  Feeling bad or failure about yourself  2  Trouble concentrating 2  Moving slowly or fidgety/restless 1  Suicidal thoughts 0  PHQ-9 Score 14     Assessment and Plan:   Other Fatigue Alejandra Crane does feel that her weight is causing her energy to be lower than it should be. Fatigue may be related to obesity, depression or many other causes. Labs will be ordered, and in the meanwhile, Alejandra Crane will focus on self care including making healthy food choices,  increasing physical activity and focusing on stress reduction.  Shortness of Breath Alejandra Crane does not feel that she gets out of breath more easily that she used to when she exercises. Alejandra Crane's shortness of breath appears to be obesity related and exercise induced. She has agreed to work on weight loss and gradually increase exercise to treat her exercise induced shortness of breath. Will continue  to monitor closely.  Health Maintenance:   Obesity   Plan: Will do EKG, indirect calorimetry, and labs.     Vitamin D  Deficiency She is at risk for vitamin D  deficiency due to obesity.  She is on OTC vitamin D3 2000 IU daily. Lab Results  Component Value Date   VD25OH 42 09/12/2022   VD25OH 37 03/12/2021   VD25OH 90.3 08/02/2020    Plan: Will check for vitamin D  deficiency.   Alejandra Crane had a positive depression screening. Depression is commonly associated with obesity and often results in emotional eating behaviors. We will monitor this closely and work on CBT to help improve the non-hunger eating patterns. Referral to Psychology may be required if no improvement is seen as she continues in our clinic.   Elevated glucose:   She is at risk for insulin  resistance, prediabetes, and diabetes due to obesity.   Plan:  Will check Hemoglobin A1c and insulin .   Previous labs reviewed today. Date: 11/28/2023 CMP, Lipids, and CBC and glucose.   Labs done today Insulin , HgbA1c, Vit D, and Thyroid  Panel   Generalized Obesity: BMI (Calculated): 32.73   Alejandra Crane is currently in the action stage of change and her goal is to begin weight loss efforts. I recommend Alejandra Crane begin the structured treatment plan as follows:  She has agreed to Category 1 Plan and using  My Fitness Pal that we set up for her at the office.   Exercise goals: Older adults should determine their level of effort for physical activity relative to their level of fitness.  Behavioral modification strategies:increasing lean protein intake, decreasing simple carbohydrates, increasing vegetables, increase high fiber foods, no skipping meals, meal planning and cooking strategies, keeping healthy foods in the home, better snacking choices, and planning for success  She was informed of the importance of frequent follow-up visits to maximize her success with intensive lifestyle modifications for her multiple health conditions.  She was informed we would discuss her lab results at her next visit unless there is a critical issue that needs to be addressed sooner. Alejandra Crane agreed to keep her next visit at the agreed upon time to discuss these results.  Objective:  General: Cooperative, alert, well developed, in no acute distress. HEENT: Conjunctivae and lids unremarkable. Cardiovascular: Regular rhythm.  Lungs: Normal work of breathing. Neurologic: No focal deficits.   Lab Results  Component Value Date   CREATININE 0.92 11/26/2023   BUN 24 11/26/2023   NA 142 11/26/2023   K 3.9 11/26/2023   CL 104 11/26/2023   CO2 31 11/26/2023   Lab Results  Component Value Date   ALT 10 11/26/2023   AST 17 11/26/2023   ALKPHOS 86 08/02/2020   BILITOT 0.4 11/26/2023   Lab Results  Component Value Date   HGBA1C 5.6 03/12/2021   HGBA1C 5.8 (H) 08/02/2020   HGBA1C 5.9 (H) 01/19/2020   HGBA1C 5.8 (H) 06/22/2019   HGBA1C 5.6 03/15/2019   Lab Results  Component Value Date   INSULIN  5.0 08/02/2020   INSULIN  8.7 01/19/2020   INSULIN  7.1 06/22/2019   Lab Results  Component Value Date  TSH 2.910 06/22/2019   Lab Results  Component Value Date   CHOL 202 (H) 11/26/2023   HDL 76 11/26/2023   LDLCALC 112 (H) 11/26/2023   TRIG 53 11/26/2023   CHOLHDL 2.7 11/26/2023   Lab Results  Component Value Date   WBC 6.2 11/26/2023   HGB 11.9 11/26/2023   HCT 37.8 11/26/2023   MCV 100.0 11/26/2023   PLT 261 11/26/2023   Lab Results  Component Value Date   IRON 49 08/22/2019   TIBC 246 (L) 08/22/2019   FERRITIN 77 06/22/2019    Attestation Statements:  Applicable history such as the following:  allergies, medications, problem list, medical history, surgical history, family history, social history, and previous encounter notes reviewed by clinician on day of visit:  Time spent on visit in care of the patient today including the items listed below was 45 minutes.    20 minutes were spent talking about the history,  25 minutes for face to face counseling implementing the plan, discussing the specifics of how to arrange meals, meal planning, water intake.   I spent face to face time discussing his/her plan, including breakfast, additional breakfast options, lunch, and dinner options, grocery list, and snacks.  I reviewed her indirect calorimetry. I discussed the implications for the diet plan.    Discussed the bio-impedence test (fat %, muscle mass, and water weight) and allowed the patient to ask questions.   Discussed the following information sheets: Category 1 , Grocery List, 100 Calorie Snacks, Microwave Meals, and Smart Fruit Sheet..   I reviewed the labs which were ordered from her visit on 11/26/23. ,   I additionally spent time documenting, reviewing, and checking the codes before submitting.   This may have been prepared with the assistance of Engineer, civil (consulting).  Occasional wrong-word or sound-a-like substitutions may have occurred due to the inherent limitations of voice recognition software.    Clayborne Daring, DO

## 2024-02-10 ENCOUNTER — Encounter: Payer: Self-pay | Admitting: Bariatrics

## 2024-02-10 DIAGNOSIS — E88819 Insulin resistance, unspecified: Secondary | ICD-10-CM | POA: Insufficient documentation

## 2024-02-10 LAB — INSULIN, RANDOM: INSULIN: 13.7 u[IU]/mL (ref 2.6–24.9)

## 2024-02-10 LAB — HEMOGLOBIN A1C
Est. average glucose Bld gHb Est-mCnc: 114 mg/dL
Hgb A1c MFr Bld: 5.6 % (ref 4.8–5.6)

## 2024-02-10 LAB — TSH+T4F+T3FREE
Free T4: 0.99 ng/dL (ref 0.82–1.77)
T3, Free: 2.8 pg/mL (ref 2.0–4.4)
TSH: 1.59 u[IU]/mL (ref 0.450–4.500)

## 2024-02-10 LAB — VITAMIN D 25 HYDROXY (VIT D DEFICIENCY, FRACTURES): Vit D, 25-Hydroxy: 47.3 ng/mL (ref 30.0–100.0)

## 2024-02-23 ENCOUNTER — Encounter: Payer: Self-pay | Admitting: Bariatrics

## 2024-02-23 ENCOUNTER — Ambulatory Visit (INDEPENDENT_AMBULATORY_CARE_PROVIDER_SITE_OTHER): Admitting: Bariatrics

## 2024-02-23 VITALS — BP 114/61 | HR 58 | Temp 98.0°F | Ht 62.0 in | Wt 176.0 lb

## 2024-02-23 DIAGNOSIS — E559 Vitamin D deficiency, unspecified: Secondary | ICD-10-CM | POA: Diagnosis not present

## 2024-02-23 DIAGNOSIS — E88819 Insulin resistance, unspecified: Secondary | ICD-10-CM | POA: Diagnosis not present

## 2024-02-23 DIAGNOSIS — E669 Obesity, unspecified: Secondary | ICD-10-CM

## 2024-02-23 DIAGNOSIS — E6609 Other obesity due to excess calories: Secondary | ICD-10-CM

## 2024-02-23 DIAGNOSIS — Z6832 Body mass index (BMI) 32.0-32.9, adult: Secondary | ICD-10-CM

## 2024-02-23 NOTE — Progress Notes (Unsigned)
 First follow-up after initial visit.        WEIGHT SUMMARY AND BIOMETRICS  Weight Lost Since Last Visit: 3lb  Weight Gained Since Last Visit: 0lb   Vitals Temp: 98 F (36.7 C) BP: 114/61 Pulse Rate: (!) 58 SpO2: 98 %   Anthropometric Measurements Height: 5' 2 (1.575 m) Weight: 176 lb (79.8 kg) BMI (Calculated): 32.18 Weight at Last Visit: 179lb Weight Lost Since Last Visit: 3lb Weight Gained Since Last Visit: 0lb Starting Weight: 179lb Total Weight Loss (lbs): 3 lb (1.361 kg) Peak Weight: 230lb   Body Composition  Body Fat %: 45.4 % Fat Mass (lbs): 80.2 lbs Muscle Mass (lbs): 91.4 lbs Total Body Water (lbs): 70 lbs Visceral Fat Rating : 13   Other Clinical Data Fasting: no Labs: no Today's Visit #: 2 Starting Date: 02/09/24    OBESITY Auriella is here to discuss her progress with her obesity treatment plan along with follow-up of her obesity related diagnoses.    Nutrition Plan: the Category 1 plan - 25% adherence.  Current exercise: walking, 30-45 mins, 2-3 days  Interim History:  She is down 3 lbs since her last visit.  Not eating all of the food on the plan., Protein intake is as prescribed, Is not skipping meals, Not journaling consistently., and Water intake is adequate.  Initial positives regarding the dietary plan: She tried to not overeat. She wants to get back on track.  Initial challenges regarding  the dietary plan:  She got off the plan slightly with company.  Pharmacotherapy: Tinslee is not on any anti-obesity medications.   Hunger is moderately controlled. Varies.  Cravings are moderately controlled. Some cravings with sweets.  Assessment/Plan:   Insulin  Resistance Jiah has had elevated fasting insulin  readings. Goal is HgbA1c < 5.7, fasting insulin  at l0 or less, and preferably at 5.  She denies excessive  polyphagia. Medication(s): none Lab Results  Component Value Date   HGBA1C 5.6 02/09/2024   Lab Results  Component Value Date   INSULIN  13.7 02/09/2024   INSULIN  5.0 08/02/2020   INSULIN  8.7 01/19/2020   INSULIN  7.1 06/22/2019    Plan  Will work on the agreed upon plan. Will minimize refined carbohydrates ( sweets and starches), and focus more on complex carbohydrates.  Increase the micronutrients found in leafy greens, which include magnesium, polyphenols, and vitamin C which have been postulated to help with insulin  sensitivity. Minimize fast food and cook more meals at home.  Increase fiber to 25 to 30 grams daily.  Information sheet on  Insulin  Resistance and Prediabetes.    Vitamin D  Insuffiency  Vitamin D  is at goal of 50.  Most recent vitamin D  level was 47.3. She is on OTC vitamin D3 2000 IU daily. Lab Results  Component Value Date   VD25OH 47.3 02/09/2024   VD25OH 42 09/12/2022   VD25OH 37 03/12/2021    Plan: Will take vitamin D  OTC at 4,000 international international units daily.     Generalized Obesity: Current BMI BMI (Calculated): 32.18   Pharmacotherapy Plan No anti-obesity medications.   Lyle is currently in the action stage of change. As such, her goal is to continue with weight loss efforts.  She has agreed to the Category 1 plan.  Exercise goals: All adults should avoid inactivity. Some physical activity is better than none, and adults who participate in any amount of physical activity gain some health benefits.  Behavioral modification strategies: increasing lean protein intake, no meal skipping, meal planning , increase water  intake, better snacking choices, increasing vegetables, weigh protein portions, measure portion sizes, and mindful eating.  Ivyana has agreed to follow-up with our clinic in 2 weeks.    Labs reviewed today from last visit ( HgbA1c, insulin , vitamin D , and thyroid  panel).   Objective:   VITALS: Per patient if  applicable, see vitals. GENERAL: Alert and in no acute distress. CARDIOPULMONARY: No increased WOB. Speaking in clear sentences.  PSYCH: Pleasant and cooperative. Speech normal rate and rhythm. Affect is appropriate. Insight and judgement are appropriate. Attention is focused, linear, and appropriate.  NEURO: Oriented as arrived to appointment on time with no prompting.   Attestation Statements:    This was prepared with the assistance of Engineer, civil (consulting).  Occasional wrong-word or sound-a-like substitutions may have occurred due to the inherent limitations of voice recognition software.           Clayborne Daring, DO

## 2024-02-28 ENCOUNTER — Other Ambulatory Visit: Payer: Self-pay | Admitting: Nurse Practitioner

## 2024-03-05 ENCOUNTER — Other Ambulatory Visit: Payer: Self-pay | Admitting: Nurse Practitioner

## 2024-03-05 DIAGNOSIS — I10 Essential (primary) hypertension: Secondary | ICD-10-CM

## 2024-03-05 DIAGNOSIS — A6009 Herpesviral infection of other urogenital tract: Secondary | ICD-10-CM

## 2024-03-08 ENCOUNTER — Ambulatory Visit (INDEPENDENT_AMBULATORY_CARE_PROVIDER_SITE_OTHER): Admitting: Bariatrics

## 2024-03-08 ENCOUNTER — Encounter: Payer: Self-pay | Admitting: Bariatrics

## 2024-03-08 ENCOUNTER — Encounter: Payer: Self-pay | Admitting: Radiology

## 2024-03-08 VITALS — BP 116/77 | HR 99 | Temp 97.9°F | Ht 62.0 in | Wt 175.0 lb

## 2024-03-08 DIAGNOSIS — Z6832 Body mass index (BMI) 32.0-32.9, adult: Secondary | ICD-10-CM | POA: Diagnosis not present

## 2024-03-08 DIAGNOSIS — E66811 Obesity, class 1: Secondary | ICD-10-CM

## 2024-03-08 DIAGNOSIS — E669 Obesity, unspecified: Secondary | ICD-10-CM

## 2024-03-08 DIAGNOSIS — E88819 Insulin resistance, unspecified: Secondary | ICD-10-CM

## 2024-03-08 NOTE — Progress Notes (Signed)
 WEIGHT SUMMARY AND BIOMETRICS  Weight Lost Since Last Visit: 1lb  Weight Gained Since Last Visit: 0   Vitals Temp: 97.9 F (36.6 C) BP: 116/77 Pulse Rate: 99 SpO2: 99 %   Anthropometric Measurements Height: 5' 2 (1.575 m) Weight: 175 lb (79.4 kg) BMI (Calculated): 32 Weight at Last Visit: 176lb Weight Lost Since Last Visit: 1lb Weight Gained Since Last Visit: 0 Starting Weight: 179lb Total Weight Loss (lbs): 4 lb (1.814 kg) Peak Weight: 230lb   Body Composition  Body Fat %: 44.3 % Fat Mass (lbs): 77.8 lbs Muscle Mass (lbs): 93 lbs Total Body Water (lbs): 68.2 lbs Visceral Fat Rating : 13   Other Clinical Data Fasting: no Labs: no Today's Visit #: 3 Starting Date: 02/09/24    OBESITY Alejandra Crane is here to discuss her progress with her obesity treatment plan along with follow-up of her obesity related diagnoses.    Nutrition Plan: the Category 1 plan - 50% adherence.  Current exercise: walking  Interim History:  Alejandra Crane is down 1 lb since her last visit. Alejandra Crane states that Alejandra Crane has a terrible sweet tooth.  Eating all of the food on the plan., Protein intake is as prescribed, Is skipping meals, and Water intake is adequate.   Pharmacotherapy: Alejandra Crane is on not on any anti-obesity medications.  Hunger is moderately controlled.  Cravings are moderately controlled.  Assessment/Plan:   Insulin  Resistance Alejandra Crane has had elevated fasting insulin  readings. Goal is HgbA1c < 5.7, fasting insulin  at l0 or less, and preferably at 5.  Alejandra Crane reports moderate polyphagia. Medication(s): none Lab Results  Component Value Date   HGBA1C 5.6 02/09/2024   Lab Results  Component Value Date   INSULIN  13.7 02/09/2024   INSULIN  5.0 08/02/2020   INSULIN  8.7 01/19/2020   INSULIN  7.1 06/22/2019    Plan Will work on the agreed upon plan. Will minimize refined  carbohydrates ( sweets and starches), and focus more on complex carbohydrates.  Increase the micronutrients found in leafy greens, which include magnesium, polyphenols, and vitamin C which have been postulated to help with insulin  sensitivity. Minimize fast food and cook more meals at home.  Increase fiber to 25 to 30 grams daily.  Alejandra Crane will get sweets out of the house.  Will have a small amt only of any dessert. Alejandra Crane will have a protein shake if Alejandra Crane skips a meal.    Generalized Obesity: Current BMI BMI (Calculated): 32   Pharmacotherapy Plan:  No anti-obesity medications.    Alejandra Crane is currently in the action stage of change. As such, her goal is to continue with weight loss efforts.  Alejandra Crane has agreed to the Category 1 plan.  Exercise goals: Older adults should determine their level of effort for physical activity relative to their level of fitness.  Alejandra Crane will being walking a dog several times per day.  Behavioral modification strategies: increasing  lean protein intake, decreasing simple carbohydrates , no meal skipping, decrease eating out, meal planning , increase water intake, better snacking choices, planning for success, increasing vegetables, avoiding temptations, keep healthy foods in the home, increase frequency of journaling, and mindful eating.  Alejandra Crane has agreed to follow-up with our clinic in 2 weeks.    Objective:   VITALS: Per patient if applicable, see vitals. GENERAL: Alert and in no acute distress. CARDIOPULMONARY: No increased WOB. Speaking in clear sentences.  PSYCH: Pleasant and cooperative. Speech normal rate and rhythm. Affect is appropriate. Insight and judgement are appropriate. Attention is focused, linear, and appropriate.  NEURO: Oriented as arrived to appointment on time with no prompting.   Attestation Statements:   This was prepared with the assistance of Engineer, Civil (consulting).  Occasional wrong-word or sound-a-like substitutions may have occurred due  to the inherent limitations of voice recognition   Clayborne Daring, DO

## 2024-03-30 ENCOUNTER — Ambulatory Visit: Admitting: Bariatrics

## 2024-03-30 ENCOUNTER — Encounter: Payer: Self-pay | Admitting: Bariatrics

## 2024-03-30 VITALS — BP 136/77 | HR 60 | Ht 62.0 in | Wt 180.0 lb

## 2024-03-30 DIAGNOSIS — E6609 Other obesity due to excess calories: Secondary | ICD-10-CM

## 2024-03-30 DIAGNOSIS — F5089 Other specified eating disorder: Secondary | ICD-10-CM

## 2024-03-30 DIAGNOSIS — E669 Obesity, unspecified: Secondary | ICD-10-CM

## 2024-03-30 DIAGNOSIS — E88819 Insulin resistance, unspecified: Secondary | ICD-10-CM

## 2024-03-30 DIAGNOSIS — Z6832 Body mass index (BMI) 32.0-32.9, adult: Secondary | ICD-10-CM

## 2024-03-30 MED ORDER — TOPIRAMATE 50 MG PO TABS: 50.0000 mg | ORAL_TABLET | Freq: Every day | ORAL | 0 refills | Status: DC

## 2024-03-30 NOTE — Progress Notes (Signed)
 WEIGHT SUMMARY AND BIOMETRICS  Weight Lost Since Last Visit: 0  Weight Gained Since Last Visit: 5lb   Vitals BP: 136/77 Pulse Rate: 60 SpO2: 98 %   Anthropometric Measurements Height: 5' 2 (1.575 m) Weight: 180 lb (81.6 kg) BMI (Calculated): 32.91 Weight at Last Visit: 175lb Weight Lost Since Last Visit: 0 Weight Gained Since Last Visit: 5lb Starting Weight: 179lb Total Weight Loss (lbs): 0 lb (0 kg) Peak Weight: 230lb   Body Composition  Body Fat %: 45.8 % Fat Mass (lbs): 82.6 lbs Muscle Mass (lbs): 92.6 lbs Total Body Water (lbs): 69.8 lbs Visceral Fat Rating : 14   Other Clinical Data Fasting: no Labs: no Today's Visit #: 4 Starting Date: 02/09/24    OBESITY Alejandra Crane is here to discuss Alejandra Crane progress with Alejandra Crane obesity treatment plan along with follow-up of Alejandra Crane obesity related diagnoses.    Nutrition Plan: the Category 1 plan - 0% adherence.  Current exercise: walking  Interim History:  Alejandra Crane is up 5 lbs since Alejandra Crane last visit.  Eating all of the food on the plan., Protein intake is as prescribed, and Water intake is adequate.   Pharmacotherapy: Alejandra Crane is on no anti-obesity medications.  Hunger is moderately controlled.  Cravings are moderately controlled.  Assessment/Plan:   Insulin  Resistance Alejandra Crane has had elevated fasting insulin  readings. Goal is HgbA1c < 5.7, fasting insulin  at l0 or less, and preferably at 5.  Alejandra Crane reports polyphagia. Medication(s): none Lab Results  Component Value Date   HGBA1C 5.6 02/09/2024   Lab Results  Component Value Date   INSULIN  13.7 02/09/2024   INSULIN  5.0 08/02/2020   INSULIN  8.7 01/19/2020   INSULIN  7.1 06/22/2019    Plan Medication(s): none  Will work on the agreed upon plan. Will minimize refined carbohydrates ( sweets and starches), and focus more on complex carbohydrates.  Increase the  micronutrients found in leafy greens, which include magnesium, polyphenols, and vitamin C which have been postulated to help with insulin  sensitivity. Minimize fast food and cook more meals at home.  Increase fiber to 25 to 30 grams daily.  Will eat more protein in the morning.  Will track Alejandra Crane protein and calories using an app. We set Alejandra Crane parameters while Alejandra Crane was here in the office.   Eating disorder/emotional eating Alejandra Crane has had issues with stress eating, emotional eating, and boredom eating. Currently this is moderately controlled. Overall mood is stable. Denies suicidal/homicidal ideation. Medication(s): none  Plan:  Motivational interviewing as well as evidence-based interventions for health behavior change were utilized today including the discussion of self monitoring techniques, problem-solving barriers and SMART goal setting techniques.  Medication(s): Topamax  50 mg daily with dinner, # 30 with no refills.  Discussed distractions to curb eating behaviors. Discussed activities to do with one's hands in the evening  Be sure to get adequate rest as lack of rest can  trigger appetite.  Have plan in place for stressful events.  Consider other rewards besides food.     Generalized Obesity: Current BMI BMI (Calculated): 32.91   Pharmacotherapy Plan No anti-obesity medications, discussed GLP-1 medications briefly.   Alejandra Crane is currently in the action stage of change. As such, Alejandra Crane goal is to continue with weight loss efforts.  Alejandra Crane has agreed to keeping a food journal with goal of 1,200 calories and 80 to 90  grams of protein daily.  Exercise goals: Older adults with chronic conditions should understand whether and how their conditions affect their ability to do regular physical activity safely.  Behavioral modification strategies: increasing lean protein intake, decreasing simple carbohydrates , meal planning , increase water intake, better snacking choices, planning for success,  increasing fiber rich foods, emotional eating strategies, ways to avoid boredom eating, ways to avoid night time snacking, keep healthy foods in the home, increase frequency of journaling, and mindful eating.  Alejandra Crane has agreed to follow-up with our clinic in 3 weeks.     Objective:   VITALS: Per patient if applicable, see vitals. GENERAL: Alert and in no acute distress. CARDIOPULMONARY: No increased WOB. Speaking in clear sentences.  PSYCH: Pleasant and cooperative. Speech normal rate and rhythm. Affect is appropriate. Insight and judgement are appropriate. Attention is focused, linear, and appropriate.  NEURO: Oriented as arrived to appointment on time with no prompting.   Attestation Statements:   This was prepared with the assistance of Engineer, Civil (consulting).  Occasional wrong-word or sound-a-like substitutions may have occurred due to the inherent limitations of voice recognition.  Clayborne Daring, DO

## 2024-04-19 ENCOUNTER — Ambulatory Visit (INDEPENDENT_AMBULATORY_CARE_PROVIDER_SITE_OTHER): Admitting: Bariatrics

## 2024-04-19 ENCOUNTER — Encounter: Payer: Self-pay | Admitting: Bariatrics

## 2024-04-19 VITALS — BP 120/68 | HR 64 | Ht 62.0 in | Wt 181.0 lb

## 2024-04-19 DIAGNOSIS — E6609 Other obesity due to excess calories: Secondary | ICD-10-CM

## 2024-04-19 DIAGNOSIS — E669 Obesity, unspecified: Secondary | ICD-10-CM | POA: Diagnosis not present

## 2024-04-19 DIAGNOSIS — Z6833 Body mass index (BMI) 33.0-33.9, adult: Secondary | ICD-10-CM | POA: Diagnosis not present

## 2024-04-19 DIAGNOSIS — F5089 Other specified eating disorder: Secondary | ICD-10-CM

## 2024-04-19 NOTE — Progress Notes (Signed)
 WEIGHT SUMMARY AND BIOMETRICS  Weight Lost Since Last Visit: 0  Weight Gained Since Last Visit: 1lb   Vitals BP: 120/68 Pulse Rate: 64 SpO2: 100 %   Anthropometric Measurements Height: 5' 2 (1.575 m) Weight: 181 lb (82.1 kg) BMI (Calculated): 33.1 Weight at Last Visit: 180lb Weight Lost Since Last Visit: 0 Weight Gained Since Last Visit: 1lb Starting Weight: 179lb Total Weight Loss (lbs): 0 lb (0 kg) Peak Weight: 230lb   Body Composition  Body Fat %: 46.3 % Fat Mass (lbs): 84.2 lbs Muscle Mass (lbs): 92.6 lbs Total Body Water (lbs): 69.4 lbs Visceral Fat Rating : 14   Other Clinical Data Fasting: no Labs: no Today's Visit #: 5 Starting Date: 02/09/24    OBESITY Alejandra Crane is here to discuss her progress with her obesity treatment plan along with follow-up of her obesity related diagnoses.    Nutrition Plan: the Category 1 plan - 50% adherence.  Current exercise: walking  Interim History:  She is up 1 lb since her last visit.  She is not comfortable taking the Topamax  after reading about the side effects. Eating all of the food on the plan., Protein intake is as prescribed, Is not skipping meals, Not journaling consistently., and Water intake is adequate.   Pharmacotherapy: Ashani is not on any anti-obesity medications.  Hunger is moderately controlled.  Cravings are moderately controlled.  Assessment/Plan:   Eating disorder/emotional eating Vanna has had issues with stress eating, emotional eating, and boredom eating. Currently this is moderately controlled. Overall mood is stable. Denies suicidal/homicidal ideation. Medication(s): I had prescribed Topamax  but she does not feel comfortable taking this medication at this time.  Plan:  Assigned homework of completing food diaries and identifying triggers. Discussed distractions to curb  eating behaviors. Discussed activities to do with one's hands in the evening  Be sure to get adequate rest as lack of rest can trigger appetite.  Have plan in place for stressful events.  She will increase her meal planning. She will continue to increase her protein and changed her meal plan to include more pescatarian options. Will review her intake of fiber and add as needed. Discussed healthy fats.   Generalized Obesity: Current BMI BMI (Calculated): 33.1   Pharmacotherapy Plan No antiobesity medications  Bassheva is currently in the action stage of change. As such, her goal is to continue with weight loss efforts.  She has agreed to the Category 1 plan and the pescatarian plan.  Exercise goals: Older adults should determine their level of effort for physical activity relative to their level of fitness.  She will add in some weights to help maintain muscle. Behavioral modification strategies: increasing lean protein intake, no meal skipping, meal planning , increase water intake, better snacking choices, planning for success, increasing vegetables, increasing fiber rich foods, avoiding temptations, keep healthy foods in the home, and mindful eating.  Savita has agreed to follow-up with our clinic in 4 weeks.      Objective:   VITALS: Per patient if applicable, see vitals. GENERAL: Alert and in no acute distress. CARDIOPULMONARY: No increased WOB. Speaking in clear sentences.  PSYCH: Pleasant and cooperative. Speech normal rate and rhythm. Affect is appropriate. Insight and judgement are appropriate. Attention is focused, linear, and appropriate.  NEURO: Oriented as arrived to appointment on time with no prompting.   Attestation Statements:   This was prepared with the assistance of Engineer, Civil (consulting).  Occasional wrong-word or sound-a-like substitutions may have occurred due to the inherent limitations of voice recognition   Clayborne Daring, DO

## 2024-05-18 ENCOUNTER — Ambulatory Visit: Admitting: Bariatrics

## 2024-05-18 ENCOUNTER — Encounter: Payer: Self-pay | Admitting: Bariatrics

## 2024-05-18 VITALS — BP 113/64 | HR 66 | Ht 62.0 in | Wt 180.0 lb

## 2024-05-18 DIAGNOSIS — E669 Obesity, unspecified: Secondary | ICD-10-CM | POA: Diagnosis not present

## 2024-05-18 DIAGNOSIS — Z6832 Body mass index (BMI) 32.0-32.9, adult: Secondary | ICD-10-CM

## 2024-05-18 DIAGNOSIS — F5089 Other specified eating disorder: Secondary | ICD-10-CM | POA: Diagnosis not present

## 2024-05-18 NOTE — Progress Notes (Signed)
 "                                                                                                             WEIGHT SUMMARY AND BIOMETRICS  Weight Lost Since Last Visit: 1lb  Weight Gained Since Last Visit: 0   Vitals BP: 113/64 Pulse Rate: 66 SpO2: 99 %   Anthropometric Measurements Height: 5' 2 (1.575 m) Weight: 180 lb (81.6 kg) BMI (Calculated): 32.91 Weight at Last Visit: 181lb Weight Lost Since Last Visit: 1lb Weight Gained Since Last Visit: 0 Starting Weight: 179lb Total Weight Loss (lbs): 0 lb (0 kg) Peak Weight: 230lb   Body Composition  Body Fat %: 45 % Fat Mass (lbs): 81.2 lbs Muscle Mass (lbs): 94.2 lbs Total Body Water (lbs): 67.8 lbs Visceral Fat Rating : 14   Other Clinical Data Fasting: no Labs: no Today's Visit #: 6 Starting Date: 02/09/24    OBESITY Alejandra Crane is here to discuss her progress with her obesity treatment plan along with follow-up of her obesity related diagnoses.    Nutrition Plan: the Category 1 plan - 50% adherence.  Current exercise: walking  Interim History:  She is down 1 lb since her last visit.  Eating all of the food on the plan., Protein intake is as prescribed, Is not skipping meals, and Water intake is adequate.   Pharmacotherapy: Alejandra Crane is not on any anti-obesity medications.  Hunger is moderately controlled.  Cravings are moderately controlled.  Assessment/Plan:   Eating disorder/emotional eating Alejandra Crane has had issues with stress eating, emotional eating, and boredom eating. Currently this is moderately controlled. Overall mood is stable. Denies suicidal/homicidal ideation. Medication(s): none  Plan:  Specifically regarding patient's less desirable eating habits and patterns, we employed the technique of small changes when she cannot fully commit to her prudent nutritional plan. Discussed distractions to curb eating behaviors. Discussed activities to do with one's hands in the evening  Be sure to get  adequate rest as lack of rest can trigger appetite.  Have plan in place for stressful events.  Consider other rewards besides food.     Generalized Obesity: Current BMI BMI (Calculated): 32.91   Pharmacotherapy Plan She may consider oral Wegovy in the future. Discussed Wegovy briefly.   Alejandra Crane is currently in the action stage of change. As such, her goal is to continue with weight loss efforts.  She has agreed to the Category 1 plan.  Exercise goals: Older adults should determine their level of effort for physical activity relative to their level of fitness.   Behavioral modification strategies: increasing lean protein intake, decreasing simple carbohydrates , no meal skipping, meal planning , increase water intake, better snacking choices, planning for success, increasing vegetables, increasing lower sugar fruits, avoiding temptations, keep healthy foods in the home, weigh protein portions, measure portion sizes, and mindful eating.  Alejandra Crane has agreed to follow-up with our clinic in 4 weeks.     Objective:   VITALS: Per patient if applicable, see vitals. GENERAL: Alert and in no acute distress. CARDIOPULMONARY: No increased  WOB. Speaking in clear sentences.  PSYCH: Pleasant and cooperative. Speech normal rate and rhythm. Affect is appropriate. Insight and judgement are appropriate. Attention is focused, linear, and appropriate.  NEURO: Oriented as arrived to appointment on time with no prompting.   Attestation Statements:   This was prepared with the assistance of Engineer, Civil (consulting).  Occasional wrong-word or sound-a-like substitutions may have occurred due to the inherent limitations of voice recognition.  Clayborne Daring, DO   "

## 2024-05-20 ENCOUNTER — Telehealth: Payer: Self-pay

## 2024-05-20 NOTE — Telephone Encounter (Signed)
 Called re: PREP program referral; she would like to attend next class at [place] [facility] on 06/08/24, every T/Th at 2pm; assessment visit scheduled for 1/26 at 10am

## 2024-05-25 ENCOUNTER — Other Ambulatory Visit: Payer: Self-pay | Admitting: Medical Genetics

## 2024-05-28 ENCOUNTER — Encounter: Payer: Self-pay | Admitting: Nurse Practitioner

## 2024-05-28 ENCOUNTER — Ambulatory Visit: Payer: Self-pay | Admitting: Nurse Practitioner

## 2024-05-28 VITALS — BP 118/80 | HR 59 | Temp 97.9°F | Ht 62.0 in | Wt 185.2 lb

## 2024-05-28 DIAGNOSIS — F325 Major depressive disorder, single episode, in full remission: Secondary | ICD-10-CM | POA: Diagnosis not present

## 2024-05-28 DIAGNOSIS — M8580 Other specified disorders of bone density and structure, unspecified site: Secondary | ICD-10-CM | POA: Diagnosis not present

## 2024-05-28 DIAGNOSIS — Z1211 Encounter for screening for malignant neoplasm of colon: Secondary | ICD-10-CM

## 2024-05-28 DIAGNOSIS — E782 Mixed hyperlipidemia: Secondary | ICD-10-CM

## 2024-05-28 DIAGNOSIS — E6609 Other obesity due to excess calories: Secondary | ICD-10-CM | POA: Diagnosis not present

## 2024-05-28 DIAGNOSIS — E559 Vitamin D deficiency, unspecified: Secondary | ICD-10-CM | POA: Diagnosis not present

## 2024-05-28 DIAGNOSIS — Z1212 Encounter for screening for malignant neoplasm of rectum: Secondary | ICD-10-CM | POA: Diagnosis not present

## 2024-05-28 DIAGNOSIS — I10 Essential (primary) hypertension: Secondary | ICD-10-CM | POA: Diagnosis not present

## 2024-05-28 DIAGNOSIS — Z6833 Body mass index (BMI) 33.0-33.9, adult: Secondary | ICD-10-CM | POA: Diagnosis not present

## 2024-05-28 DIAGNOSIS — E66811 Obesity, class 1: Secondary | ICD-10-CM

## 2024-05-28 NOTE — Progress Notes (Signed)
 "   Careteam: Patient Care Team: Caro Harlene POUR, NP as PCP - General (Geriatric Medicine) Skeet Juliene SAUNDERS, DO as Consulting Physician (Neurology)  PLACE OF SERVICE:  [facility]  Advanced Directive information    Allergies[1]  Chief Complaint  Patient presents with   Routine Visit    HPI:  Discussed the use of AI scribe software for clinical note transcription with the patient, who gave verbal consent to proceed.  History of Present Illness Alejandra Crane is a 74 year old female who presents for a six-month follow-up visit.  She has been participating in a weight and wellness program but continues to gain weight, which she attributes to her dietary habits, particularly sweets. She was walking regularly until the weather became too cold. She has not started taking Topamax  due to depressive side effects and is considering starting [facility] for weight management.  She is currently taking calcium  and vitamin D  for osteopenia. She has a history of vitamin D  deficiency, which is why she is on vitamin D  supplementation.  She is due for a colonoscopy this year but has not yet scheduled it. She requires a referral for this procedure as her last colonoscopy was done at [facility] in an emergency situation, and she cannot recall the name of the previous GI doctor.  She is on Crestor  for cholesterol management.   Her A1c was checked recently and was reported as normal. For hypertension, she is taking HCTZ.  She is on Zoloft  for depression and reports that it is working well for her. She has Valtrex  available for flare-ups but has not used it in the past month and has not experienced any flares.   She reports regular bowel movements after eating, which are formed and easily evacuated. No chest pain or shortness of breath.    Review of Systems:  Review of Systems  Constitutional:  Negative for chills, fever and weight loss.  HENT:  Negative for tinnitus.   Respiratory:  Negative for  cough, sputum production and shortness of breath.   Cardiovascular:  Negative for chest pain, palpitations and leg swelling.  Gastrointestinal:  Negative for abdominal pain, constipation, diarrhea and heartburn.  Genitourinary:  Negative for dysuria, frequency and urgency.  Musculoskeletal:  Negative for back pain, falls, joint pain and myalgias.  Skin: Negative.   Neurological:  Negative for dizziness and headaches.  Psychiatric/Behavioral:  Negative for depression and memory loss. The patient does not have insomnia.     Past Medical History:  Diagnosis Date   Anemia    Anxiety    Arthritis    Asthma    Back pain    Depression    Diverticulosis    noted on colonscopy    Elevated cholesterol    GERD (gastroesophageal reflux disease)    H/O mammogram    2015, 2016   Hepatitis    Hep B in 1980s   Herpes    Not gential   High blood pressure    High cholesterol    History of esophagogastroduodenoscopy (EGD)    2014, 2016   History of PFTs 10/26/2012   Lower GI bleed 01/19/2015   Related to mobic 15 mg    Mitral valve prolapse 09/06/1996   Osteoarthritis of both knees    Pneumonia    Pre-diabetes    Vitamin D  deficiency    Past Surgical History:  Procedure Laterality Date   BREAST EXCISIONAL BIOPSY Left    benign   BREAST SURGERY  05/20/2007   [facility], Dr.William  Purkert    COLONOSCOPY     2014, 2016 [facility], Dr.Mahmood Abedi    FOOT ARTHRODESIS Right 10/09/2022   Procedure: RIGHT TALONAVICULAR AND SUBTALAR FUSION;  Surgeon: Harden Jerona GAILS, MD;  Location: [facility] OR;  Service: Orthopedics;  Laterality: Right;   KNEE ARTHROSCOPY Right    Social History:   reports that she quit smoking about 37 years ago. Her smoking use included cigarettes. She started smoking about 47 years ago. She has a 2.5 pack-year smoking history. She has never used smokeless tobacco. She reports current alcohol use. She reports that she does not use drugs.  Family History  Problem Relation  Age of Onset   Heart attack Mother    CVA Mother    Diabetes Mother    Congestive Heart Failure Mother    Stroke Mother    Heart disease Mother    Hyperlipidemia Mother    Hypertension Mother    Depression Mother    Obesity Mother    Alcoholism Father    Lung cancer Sister 1   Stomach cancer Brother    Diabetes Maternal Grandmother    Neurologic Disorder Son    Colon cancer Niece     Medications: Patient's Medications  New Prescriptions   No medications on file  Previous Medications   CALCIUM  CARBONATE (CALCIUM  600) 600 MG TABS TABLET    Take 1 tablet (600 mg total) by mouth 2 (two) times daily with a meal.   CHOLECALCIFEROL (VITAMIN D3) 50 MCG (2000 UT) CAPSULE    Take 1 capsule (2,000 Units total) by mouth daily.   HYDROCHLOROTHIAZIDE  (HYDRODIURIL ) 25 MG TABLET    TAKE 1 TABLET BY MOUTH EVERY DAY   ROSUVASTATIN  (CRESTOR ) 10 MG TABLET    TAKE 1 TABLET BY MOUTH EVERY DAY   SERTRALINE  (ZOLOFT ) 100 MG TABLET    TAKE 1 TABLET BY MOUTH EVERY DAY   TOPIRAMATE  (TOPAMAX ) 50 MG TABLET    Take 1 tablet (50 mg total) by mouth daily with supper.   VALACYCLOVIR  (VALTREX ) 500 MG TABLET    TAKE 1 TABLET BY MOUTH EVERY DAY  Modified Medications   No medications on file  Discontinued Medications   No medications on file    Physical Exam:  Vitals:   05/28/24 1035  BP: 118/80  Pulse: (!) 59  Temp: 97.9 F (36.6 C)  SpO2: 96%  Weight: 185 lb 3.2 oz (84 kg)  Height: 5' 2 (1.575 m)   Body mass index is 33.87 kg/m. Wt Readings from Last 3 Encounters:  05/28/24 185 lb 3.2 oz (84 kg)  05/18/24 180 lb (81.6 kg)  04/19/24 181 lb (82.1 kg)    Physical Exam Constitutional:      General: She is not in acute distress.    Appearance: She is well-developed. She is not diaphoretic.  HENT:     Head: Normocephalic and atraumatic.     Mouth/Throat:     Pharynx: No oropharyngeal exudate.  Eyes:     Conjunctiva/sclera: Conjunctivae normal.     Pupils: Pupils are equal, round, and  reactive to light.  Cardiovascular:     Rate and Rhythm: Normal rate and regular rhythm.     Heart sounds: Normal heart sounds.  Pulmonary:     Effort: Pulmonary effort is normal.     Breath sounds: Normal breath sounds.  Abdominal:     General: Bowel sounds are normal.     Palpations: Abdomen is soft.  Musculoskeletal:     Cervical back: Normal  range of motion and neck supple.     Right lower leg: No edema.     Left lower leg: No edema.  Skin:    General: Skin is warm and dry.  Neurological:     Mental Status: She is alert.  Psychiatric:        Mood and Affect: Mood normal.     Labs reviewed: Basic Metabolic Panel: Recent Labs    11/26/23 1340 02/09/24 0908  NA 142  --   K 3.9  --   CL 104  --   CO2 31  --   GLUCOSE 86  --   BUN 24  --   CREATININE 0.92  --   CALCIUM  9.6  --   TSH  --  1.590   Liver Function Tests: Recent Labs    11/26/23 1340  AST 17  ALT 10  BILITOT 0.4  PROT 6.9   No results for input(s): LIPASE, AMYLASE in the last 8760 hours. No results for input(s): AMMONIA in the last 8760 hours. CBC: Recent Labs    11/26/23 1340  WBC 6.2  NEUTROABS 3,131  HGB 11.9  HCT 37.8  MCV 100.0  PLT 261   Lipid Panel: Recent Labs    11/26/23 1340  CHOL 202*  HDL 76  LDLCALC 112*  TRIG 53  CHOLHDL 2.7   TSH: Recent Labs    02/09/24 0908  TSH 1.590   A1C: Lab Results  Component Value Date   HGBA1C 5.6 02/09/2024     Assessment/Plan Assessment and Plan Assessment & Plan Colorectal cancer screening Due for colonoscopy this year. - Placed referral for colonoscopy.  Class 1 obesity Experiencing weight gain despite wellness program. Discussed [place] benefits, side effects, and insurance coverage. Emphasized dietary changes. -continue with weight management  - Advised reducing sweets and portion sizes.  Essential hypertension Managed with HCTZ. - Checked electrolytes.  Mixed hyperlipidemia Managed with Crestor . -  Rechecked cholesterol levels.  Osteopenia Managed with calcium  and vitamin D  supplementation. -encouraged weight bearing exercises  - Continue calcium  and vitamin D  supplementation.  Vitamin D  deficiency Managed with vitamin D  supplementation. - Continue vitamin D  supplementation.  Major depressive disorder, in full remission Managed with Zoloft . Reports feeling well. - Continue Zoloft .  Herpes simplex infection No recent flares. Valtrex  available for management. Discussed dosing. - Use Valtrex  500 mg twice daily for 3 days as needed for flare.  Return in about 6 months (around 11/25/2024) for routine follow up.  Zanaiya Calabria K. Caro BODILY [facility] [phone]     [1]  Allergies Allergen Reactions   Mobic [Meloxicam]     GI bleed   "

## 2024-05-29 LAB — COMPREHENSIVE METABOLIC PANEL WITH GFR
AG Ratio: 1.7 (calc) (ref 1.0–2.5)
ALT: 11 U/L (ref 6–29)
AST: 17 U/L (ref 10–35)
Albumin: 4.2 g/dL (ref 3.6–5.1)
Alkaline phosphatase (APISO): 66 U/L (ref 37–153)
BUN: 20 mg/dL (ref 7–25)
CO2: 31 mmol/L (ref 20–32)
Calcium: 9.5 mg/dL (ref 8.6–10.4)
Chloride: 106 mmol/L (ref 98–110)
Creat: 0.79 mg/dL (ref 0.60–1.00)
Globulin: 2.5 g/dL (ref 1.9–3.7)
Glucose, Bld: 83 mg/dL (ref 65–99)
Potassium: 4.1 mmol/L (ref 3.5–5.3)
Sodium: 143 mmol/L (ref 135–146)
Total Bilirubin: 0.4 mg/dL (ref 0.2–1.2)
Total Protein: 6.7 g/dL (ref 6.1–8.1)
eGFR: 79 mL/min/{1.73_m2}

## 2024-05-29 LAB — CBC WITH DIFFERENTIAL/PLATELET
Absolute Lymphocytes: 2452 {cells}/uL (ref 850–3900)
Absolute Monocytes: 630 {cells}/uL (ref 200–950)
Basophils Absolute: 47 {cells}/uL (ref 0–200)
Basophils Relative: 0.7 %
Eosinophils Absolute: 147 {cells}/uL (ref 15–500)
Eosinophils Relative: 2.2 %
HCT: 38.8 % (ref 35.9–46.0)
Hemoglobin: 12.4 g/dL (ref 11.7–15.5)
MCH: 30.8 pg (ref 27.0–33.0)
MCHC: 32 g/dL (ref 31.6–35.4)
MCV: 96.3 fL (ref 81.4–101.7)
MPV: 11.1 fL (ref 7.5–12.5)
Monocytes Relative: 9.4 %
Neutro Abs: 3424 {cells}/uL (ref 1500–7800)
Neutrophils Relative %: 51.1 %
Platelets: 258 10*3/uL (ref 140–400)
RBC: 4.03 Million/uL (ref 3.80–5.10)
RDW: 12.4 % (ref 11.0–15.0)
Total Lymphocyte: 36.6 %
WBC: 6.7 10*3/uL (ref 3.8–10.8)

## 2024-05-29 LAB — LIPID PANEL
Cholesterol: 188 mg/dL
HDL: 75 mg/dL
LDL Cholesterol (Calc): 98 mg/dL
Non-HDL Cholesterol (Calc): 113 mg/dL
Total CHOL/HDL Ratio: 2.5 (calc)
Triglycerides: 65 mg/dL

## 2024-05-30 ENCOUNTER — Ambulatory Visit: Payer: Self-pay | Admitting: Nurse Practitioner

## 2024-06-15 ENCOUNTER — Ambulatory Visit: Admitting: Bariatrics

## 2024-12-13 ENCOUNTER — Ambulatory Visit: Admitting: Nurse Practitioner
